# Patient Record
Sex: Female | Born: 1970 | Race: Black or African American | Hispanic: No | Marital: Married | Smoking: Never smoker
Health system: Southern US, Community
[De-identification: ages and names within clinical notes are randomized; demographics above are authoritative.]

## PROBLEM LIST (undated history)

## (undated) DIAGNOSIS — L732 Hidradenitis suppurativa: Secondary | ICD-10-CM

## (undated) DIAGNOSIS — T7840XA Allergy, unspecified, initial encounter: Secondary | ICD-10-CM

## (undated) DIAGNOSIS — L910 Hypertrophic scar: Secondary | ICD-10-CM

## (undated) DIAGNOSIS — M25559 Pain in unspecified hip: Secondary | ICD-10-CM

## (undated) DIAGNOSIS — B009 Herpesviral infection, unspecified: Secondary | ICD-10-CM

## (undated) DIAGNOSIS — I10 Essential (primary) hypertension: Secondary | ICD-10-CM

## (undated) DIAGNOSIS — R5383 Other fatigue: Secondary | ICD-10-CM

## (undated) DIAGNOSIS — Z91018 Allergy to other foods: Secondary | ICD-10-CM

## (undated) DIAGNOSIS — K219 Gastro-esophageal reflux disease without esophagitis: Secondary | ICD-10-CM

## (undated) DIAGNOSIS — M7989 Other specified soft tissue disorders: Secondary | ICD-10-CM

## (undated) DIAGNOSIS — R7303 Prediabetes: Secondary | ICD-10-CM

## (undated) DIAGNOSIS — E7800 Pure hypercholesterolemia, unspecified: Secondary | ICD-10-CM

## (undated) DIAGNOSIS — N920 Excessive and frequent menstruation with regular cycle: Secondary | ICD-10-CM

## (undated) DIAGNOSIS — E739 Lactose intolerance, unspecified: Secondary | ICD-10-CM

## (undated) DIAGNOSIS — L739 Follicular disorder, unspecified: Secondary | ICD-10-CM

## (undated) HISTORY — DX: Allergy, unspecified, initial encounter: T78.40XA

## (undated) HISTORY — DX: Herpesviral infection, unspecified: B00.9

## (undated) HISTORY — DX: Other specified soft tissue disorders: M79.89

## (undated) HISTORY — DX: Pain in unspecified hip: M25.559

## (undated) HISTORY — DX: Lactose intolerance, unspecified: E73.9

## (undated) HISTORY — DX: Essential (primary) hypertension: I10

## (undated) HISTORY — DX: Hidradenitis suppurativa: L73.2

## (undated) HISTORY — DX: Excessive and frequent menstruation with regular cycle: N92.0

## (undated) HISTORY — DX: Gastro-esophageal reflux disease without esophagitis: K21.9

## (undated) HISTORY — DX: Allergy to other foods: Z91.018

## (undated) HISTORY — DX: Follicular disorder, unspecified: L73.9

## (undated) HISTORY — DX: Pure hypercholesterolemia, unspecified: E78.00

## (undated) HISTORY — DX: Hypertrophic scar: L91.0

## (undated) HISTORY — PX: FOOT SURGERY: SHX648

## (undated) HISTORY — DX: Other fatigue: R53.83

## (undated) HISTORY — DX: Prediabetes: R73.03

---

## 1999-04-09 ENCOUNTER — Other Ambulatory Visit: Admission: RE | Admit: 1999-04-09 | Discharge: 1999-04-09 | Payer: Self-pay | Admitting: Family Medicine

## 2001-09-13 ENCOUNTER — Other Ambulatory Visit: Admission: RE | Admit: 2001-09-13 | Discharge: 2001-09-13 | Payer: Self-pay | Admitting: Family Medicine

## 2004-07-24 ENCOUNTER — Ambulatory Visit: Payer: Self-pay | Admitting: Family Medicine

## 2004-07-24 ENCOUNTER — Other Ambulatory Visit: Admission: RE | Admit: 2004-07-24 | Discharge: 2004-07-24 | Payer: Self-pay | Admitting: Family Medicine

## 2004-07-24 LAB — CONVERTED CEMR LAB: Pap Smear: NORMAL

## 2004-09-04 ENCOUNTER — Ambulatory Visit: Payer: Self-pay | Admitting: Family Medicine

## 2006-10-03 ENCOUNTER — Encounter: Payer: Self-pay | Admitting: Family Medicine

## 2006-11-29 ENCOUNTER — Encounter: Payer: Self-pay | Admitting: Family Medicine

## 2006-11-29 DIAGNOSIS — L738 Other specified follicular disorders: Secondary | ICD-10-CM | POA: Insufficient documentation

## 2006-11-29 DIAGNOSIS — L678 Other hair color and hair shaft abnormalities: Secondary | ICD-10-CM | POA: Insufficient documentation

## 2006-11-29 DIAGNOSIS — L910 Hypertrophic scar: Secondary | ICD-10-CM | POA: Insufficient documentation

## 2006-11-30 ENCOUNTER — Ambulatory Visit: Payer: Self-pay | Admitting: Family Medicine

## 2006-11-30 DIAGNOSIS — R5383 Other fatigue: Secondary | ICD-10-CM

## 2006-11-30 DIAGNOSIS — R7989 Other specified abnormal findings of blood chemistry: Secondary | ICD-10-CM | POA: Insufficient documentation

## 2006-11-30 DIAGNOSIS — R5381 Other malaise: Secondary | ICD-10-CM | POA: Insufficient documentation

## 2006-12-01 LAB — CONVERTED CEMR LAB
Basophils Absolute: 0.1 10*3/uL (ref 0.0–0.1)
Eosinophils Absolute: 0.1 10*3/uL (ref 0.0–0.6)
INR: 0.9 (ref 0.9–2.0)
MCHC: 35.6 g/dL (ref 30.0–36.0)
MCV: 86.9 fL (ref 78.0–100.0)
Monocytes Absolute: 0.8 10*3/uL — ABNORMAL HIGH (ref 0.2–0.7)
Monocytes Relative: 11.7 % — ABNORMAL HIGH (ref 3.0–11.0)
RBC: 4.56 M/uL (ref 3.87–5.11)
RDW: 12.6 % (ref 11.5–14.6)
aPTT: 33.3 s (ref 26.5–36.5)

## 2006-12-05 ENCOUNTER — Encounter: Payer: Self-pay | Admitting: Family Medicine

## 2006-12-05 ENCOUNTER — Other Ambulatory Visit: Admission: RE | Admit: 2006-12-05 | Discharge: 2006-12-05 | Payer: Self-pay | Admitting: Family Medicine

## 2006-12-05 ENCOUNTER — Ambulatory Visit: Payer: Self-pay | Admitting: Family Medicine

## 2006-12-09 ENCOUNTER — Encounter (INDEPENDENT_AMBULATORY_CARE_PROVIDER_SITE_OTHER): Payer: Self-pay | Admitting: *Deleted

## 2006-12-09 LAB — CONVERTED CEMR LAB: Pap Smear: NORMAL

## 2007-08-31 ENCOUNTER — Ambulatory Visit: Payer: Self-pay | Admitting: Family Medicine

## 2007-09-26 ENCOUNTER — Ambulatory Visit: Payer: Self-pay | Admitting: Family Medicine

## 2007-10-02 LAB — CONVERTED CEMR LAB: Herpes Simplex Vrs I&II-IgM Ab (EIA): NOT DETECTED

## 2007-12-12 ENCOUNTER — Other Ambulatory Visit: Admission: RE | Admit: 2007-12-12 | Discharge: 2007-12-12 | Payer: Self-pay | Admitting: Family Medicine

## 2007-12-12 ENCOUNTER — Encounter: Payer: Self-pay | Admitting: Family Medicine

## 2007-12-12 ENCOUNTER — Ambulatory Visit: Payer: Self-pay | Admitting: Family Medicine

## 2007-12-14 LAB — CONVERTED CEMR LAB
ALT: 15 units/L (ref 0–35)
AST: 20 units/L (ref 0–37)
Albumin: 3.8 g/dL (ref 3.5–5.2)
Alkaline Phosphatase: 74 units/L (ref 39–117)
BUN: 6 mg/dL (ref 6–23)
Basophils Relative: 0.4 % (ref 0.0–1.0)
CO2: 27 meq/L (ref 19–32)
Chloride: 104 meq/L (ref 96–112)
Creatinine, Ser: 0.8 mg/dL (ref 0.4–1.2)
Eosinophils Absolute: 0.1 10*3/uL (ref 0.0–0.7)
Eosinophils Relative: 0.8 % (ref 0.0–5.0)
Glucose, Bld: 79 mg/dL (ref 70–99)
HDL: 36.7 mg/dL — ABNORMAL LOW (ref >39.0)
MCV: 87 fL (ref 78.0–100.0)
Monocytes Relative: 2.3 % — ABNORMAL LOW (ref 3.0–12.0)
Neutrophils Relative %: 74.6 % (ref 43.0–77.0)
Platelets: 407 10*3/uL — ABNORMAL HIGH (ref 150–400)
Potassium: 3.6 meq/L (ref 3.5–5.1)
RBC: 4.39 M/uL (ref 3.87–5.11)
TSH: 1.15 microintl units/mL (ref 0.35–5.50)
Total CHOL/HDL Ratio: 4.1
Total Protein: 7.5 g/dL (ref 6.0–8.3)
VLDL: 12 mg/dL (ref 0–40)
WBC: 6.3 10*3/uL (ref 4.5–10.5)

## 2007-12-18 ENCOUNTER — Encounter (INDEPENDENT_AMBULATORY_CARE_PROVIDER_SITE_OTHER): Payer: Self-pay | Admitting: *Deleted

## 2008-01-01 ENCOUNTER — Telehealth (INDEPENDENT_AMBULATORY_CARE_PROVIDER_SITE_OTHER): Payer: Self-pay | Admitting: *Deleted

## 2008-06-17 ENCOUNTER — Telehealth: Payer: Self-pay | Admitting: Family Medicine

## 2009-01-20 ENCOUNTER — Ambulatory Visit: Payer: Self-pay | Admitting: Family Medicine

## 2009-01-20 DIAGNOSIS — B009 Herpesviral infection, unspecified: Secondary | ICD-10-CM | POA: Insufficient documentation

## 2009-02-03 ENCOUNTER — Encounter: Payer: Self-pay | Admitting: Family Medicine

## 2009-02-21 ENCOUNTER — Encounter: Payer: Self-pay | Admitting: Family Medicine

## 2009-02-21 LAB — HM MAMMOGRAPHY: HM Mammogram: NORMAL

## 2009-02-22 DIAGNOSIS — R928 Other abnormal and inconclusive findings on diagnostic imaging of breast: Secondary | ICD-10-CM | POA: Insufficient documentation

## 2009-08-08 ENCOUNTER — Telehealth: Payer: Self-pay | Admitting: Family Medicine

## 2010-01-16 ENCOUNTER — Telehealth (INDEPENDENT_AMBULATORY_CARE_PROVIDER_SITE_OTHER): Payer: Self-pay | Admitting: *Deleted

## 2010-01-16 ENCOUNTER — Ambulatory Visit: Payer: Self-pay | Admitting: Family Medicine

## 2010-01-18 LAB — CONVERTED CEMR LAB
ALT: 10 units/L (ref 0–35)
Albumin: 3.6 g/dL (ref 3.5–5.2)
Alkaline Phosphatase: 72 units/L (ref 39–117)
Basophils Relative: 0.4 % (ref 0.0–3.0)
Bilirubin, Direct: 0.1 mg/dL (ref 0.0–0.3)
CO2: 29 meq/L (ref 19–32)
Chloride: 106 meq/L (ref 96–112)
Creatinine, Ser: 0.8 mg/dL (ref 0.4–1.2)
Eosinophils Relative: 0.8 % (ref 0.0–5.0)
Hemoglobin: 13.9 g/dL (ref 12.0–15.0)
LDL Cholesterol: 116 mg/dL — ABNORMAL HIGH (ref 0–99)
MCV: 89.6 fL (ref 78.0–100.0)
Monocytes Absolute: 0.5 10*3/uL (ref 0.1–1.0)
Neutro Abs: 4.7 10*3/uL (ref 1.4–7.7)
Neutrophils Relative %: 69.5 % (ref 43.0–77.0)
Potassium: 4.1 meq/L (ref 3.5–5.1)
RBC: 4.58 M/uL (ref 3.87–5.11)
Sodium: 137 meq/L (ref 135–145)
Total CHOL/HDL Ratio: 4
Total Protein: 6.9 g/dL (ref 6.0–8.3)
Triglycerides: 58 mg/dL (ref 0.0–149.0)
WBC: 6.8 10*3/uL (ref 4.5–10.5)

## 2010-01-23 ENCOUNTER — Other Ambulatory Visit: Admission: RE | Admit: 2010-01-23 | Discharge: 2010-01-23 | Payer: Self-pay | Admitting: Family Medicine

## 2010-01-23 ENCOUNTER — Ambulatory Visit: Payer: Self-pay | Admitting: Family Medicine

## 2010-01-30 ENCOUNTER — Encounter (INDEPENDENT_AMBULATORY_CARE_PROVIDER_SITE_OTHER): Payer: Self-pay | Admitting: *Deleted

## 2010-02-04 ENCOUNTER — Encounter: Payer: Self-pay | Admitting: Family Medicine

## 2010-02-11 ENCOUNTER — Encounter: Payer: Self-pay | Admitting: Family Medicine

## 2010-05-12 ENCOUNTER — Ambulatory Visit: Payer: Self-pay | Admitting: Family Medicine

## 2010-05-12 DIAGNOSIS — L732 Hidradenitis suppurativa: Secondary | ICD-10-CM | POA: Insufficient documentation

## 2010-08-06 NOTE — Assessment & Plan Note (Signed)
Summary: CPX/DLO   Vital Signs:  Patient profile:   40 year old female Height:      67 inches Weight:      227.75 pounds BMI:     35.80 Temp:     98.5 degrees F oral Pulse rate:   84 / minute Pulse rhythm:   regular BP sitting:   118 / 86  (left arm) Cuff size:   large  Vitals Entered By: Lewanda Rife LPN (January 23, 2010 2:30 PM) CC: CPX with pap and breast exam LMP 12/26/09   History of Present Illness: here for health mt exam and gyn care  has been feeling ok   had a summer cold for a while with a cough- is better now   wt is down 5 lb has been working on it  started [employer]   bp good 118/86  menses - is ok with that -- pretty mild now , better  is not on birth control and doesn't need it  did skip for 2 months - then back again was stressed  that may have been from wt loss    has gm with ov cancer  lipids good with trig 58 and HDL 40 and LDL 116  pap 09 no abn paps in the past   mam 8/10- needs to set that up  self exam - no lumps   Td 08  Allergies (verified): No Known Drug Allergies  Past History:  Past Medical History: Last updated: 12/12/2007 menorrhagia keloid scar  folliulitis HSV   Family History: Last updated: 12/05/2006 Father: Pacemaker, HTN, DM (deceased) Mother:HTN, DM Siblings: 2 brothers MGM ovarian ca Guncle with lymphoma  Social History: Last updated: 12/12/2007 Marital Status: Children:  Occupation: works at [employer]  non smoker occ alcohol   Risk Factors: Smoking Status: never (11/29/2006)  Review of Systems General:  Denies fatigue, loss of appetite, and malaise. Eyes:  Denies blurring and eye irritation. CV:  Denies chest pain or discomfort, palpitations, shortness of breath with exertion, and swelling of feet. Resp:  Denies cough, shortness of breath, and wheezing. GI:  Denies abdominal pain, bloody stools, change in bowel habits, indigestion, nausea, and vomiting. GU:  Denies abnormal vaginal  bleeding, discharge, dysuria, and hematuria. MS:  Denies joint pain, cramps, and muscle weakness. Derm:  Denies lesion(s), poor wound healing, and rash. Neuro:  Denies numbness and tingling. Psych:  Denies anxiety and depression. Endo:  Denies excessive thirst and excessive urination. Heme:  Denies abnormal bruising and bleeding.  Physical Exam  General:  overweight but generally well appearing  Head:  normocephalic, atraumatic, and no abnormalities observed.   Eyes:  vision grossly intact, pupils equal, pupils round, and pupils reactive to light.  no conjunctival pallor, injection or icterus  Ears:  R ear normal and L ear normal.   Nose:  nares boggy - some clear rhinorrhea  Mouth:  pharynx pink and moist.    Neck:  supple with full rom and no masses or thyromegally, no JVD or carotid bruit  Chest Wall:  No deformities, masses, or tenderness noted. Breasts:  No mass, nodules, thickening, tenderness, bulging, retraction, inflamation, nipple discharge or skin changes noted.   Lungs:  Normal respiratory effort, chest expands symmetrically. Lungs are clear to auscultation, no crackles or wheezes. Heart:  Normal rate and regular rhythm. S1 and S2 normal without gallop, murmur, click, rub or other extra sounds. Abdomen:  Bowel sounds positive,abdomen soft and non-tender without masses, organomegaly or hernias noted. no renal  bruits  Genitalia:  scant blood at os- starting menses normal introitus, no external lesions, no vaginal discharge, mucosa pink and moist, no vaginal or cervical lesions, no vaginal atrophy, normal uterus size and position, and no adnexal masses or tenderness.   Msk:  No deformity or scoliosis noted of thoracic or lumbar spine.  no acute joint changes  nl rom both feet- no swelling or tenderness  Pulses:  R and L carotid,radial,femoral,dorsalis pedis and posterior tibial pulses are full and equal bilaterally Extremities:  No clubbing, cyanosis, edema, or deformity noted  with normal full range of motion of all joints.   Neurologic:  sensation intact to light touch, gait normal, and DTRs symmetrical and normal.   Skin:  Intact without suspicious lesions or rashes some lentigos  Cervical Nodes:  No lymphadenopathy noted Inguinal Nodes:  No significant adenopathy Psych:  normal affect, talkative and pleasant    Impression & Recommendations:  Problem # 1:  HEALTH MAINTENANCE EXAM (ICD-V70.0) Assessment Comment Only reviewed health habits including diet, exercise and skin cancer prevention reviewed health maintenance list and family history enc to keep up the wt loss with wt watchers and exercise rev wellness labs and lipids with pt   Problem # 2:  ROUTINE GYNECOLOGICAL EXAMINATION (ICD-V72.31) Assessment: Comment Only annual exam  was starting menses with exam  no problems  Problem # 3:  OTHER SCREENING MAMMOGRAM (ICD-V76.12) Assessment: Comment Only annual mammogram scheduled adv pt to continue regular self breast exams non remarkable breast exam today  Orders: Radiology Referral (Radiology)  Complete Medication List: 1)  Voltaren 1 % Gel (Diclofenac sodium) .... Apply small amount to affected knee up to three times a day as needed 2)  Naproxen 500 Mg Tabs (Naproxen) .... By mouth as needed  Patient Instructions: 1)  keep working on the healthy diet and exercise  2)  we will schedule mammogram at check out  3)  labs are ok including cholesterol   Current Allergies (reviewed today): No known allergies

## 2010-08-06 NOTE — Progress Notes (Signed)
Summary: refill request for valtrex  Phone Note Refill Request Message from:  Fax from Pharmacy  Refills Requested: Medication #1:  VALTREX 500 MG  TABS 1 by mouth two times a day for 7 days   Last Refilled: 11/10/2008 [facility] [address]  Initial call taken by: Lowella Petties CMA,  August 08, 2009 9:58 AM  Follow-up for Phone Call        px written on EMR for call in  Follow-up by: Judith Part MD,  August 08, 2009 11:00 AM    New/Updated Medications: VALTREX 500 MG  TABS (VALACYCLOVIR HCL) 1 by mouth two times a day for 7 days Prescriptions: VALTREX 500 MG  TABS (VALACYCLOVIR HCL) 1 by mouth two times a day for 7 days  #14 x 5   Entered by:   Lowella Petties CMA   Authorized by:   Judith Part MD   Signed by:   Lowella Petties CMA on 08/08/2009   Method used:   [facility]        [facility]       [address]       [place], [place]  [address]       Ph: [phone] or [phone]       Fax: [phone]   RxID:   [id number] VALTREX 500 MG  TABS (VALACYCLOVIR HCL) 1 by mouth two times a day for 7 days  #14 x 5   Entered and Authorized by:   Judith Part MD   Signed by:   Judith Part MD on 08/08/2009   Method used:   Telephoned to ...         RxID:   [id number]

## 2010-08-06 NOTE — Assessment & Plan Note (Signed)
Summary: bumps under skin/alc   Vital Signs:  Patient profile:   40 year old female Height:      67 inches Weight:      242.25 pounds BMI:     38.08 Temp:     98.2 degrees F oral Pulse rate:   80 / minute Pulse rhythm:   regular BP sitting:   116 / 78  (left arm) Cuff size:   large  Vitals Entered By: Lewanda Rife LPN (May 12, 2010 3:03 PM) CC: bump under skin on rt breast   History of Present Illness: has bumps under skin -- cannot break them open  usually gets them on legs  like cyst or abcess  now one in between breasts   no fever feeling ok   did have some b9 app calcifications on last mam - in aug/ rec 6 mo f/u for this   Allergies (verified): No Known Drug Allergies  Past History:  Family History: Last updated: 12/05/2006 Father: Pacemaker, HTN, DM (deceased) Mother:HTN, DM Siblings: 2 brothers MGM ovarian ca Guncle with lymphoma  Social History: Last updated: 12/12/2007 Marital Status: Children:  Occupation: works at [employer]  non smoker occ alcohol   Risk Factors: Smoking Status: never (11/29/2006)  Past Medical History: menorrhagia keloid scar  folliulitis HSV  hydradenitis   Review of Systems General:  Denies chills, fatigue, fever, loss of appetite, and malaise. Eyes:  Denies blurring and eye irritation. CV:  Denies chest pain or discomfort and palpitations. Resp:  Denies cough. MS:  Denies joint pain, joint redness, and joint swelling. Derm:  Complains of itching and lesion(s); denies insect bite(s). Neuro:  Denies numbness and tingling. Endo:  Denies cold intolerance and heat intolerance. Heme:  Denies abnormal bruising and bleeding.  Physical Exam  General:  overweight but generally well appearing  Head:  normocephalic, atraumatic, and no abnormalities observed.   Eyes:  vision grossly intact, pupils equal, pupils round, and pupils reactive to light.   Mouth:  pharynx pink and moist.   Neck:  supple with full rom and no  masses or thyromegally, no JVD or carotid bruit  Breasts:  No mass, nodules, thickening, tenderness, bulging, retraction, inflamation, nipple discharge or skin changes noted.   Lungs:  Normal respiratory effort, chest expands symmetrically. Lungs are clear to auscultation, no crackles or wheezes. Heart:  Normal rate and regular rhythm. S1 and S2 normal without gallop, murmur, click, rub or other extra sounds. Skin:  area of superficial induration and redness overlying scar under R breast medially  no drainage  is soft but not fluctuant  slt tender  Cervical Nodes:  No lymphadenopathy noted Psych:  normal affect, talkative and pleasant    Impression & Recommendations:  Problem # 1:  HIDRADENITIS SUPPURATIVA (ICD-705.83) Assessment New  with troublesome area under R medial breast  is soft / non draining  will tx with keflex and warm compresses  update if not improved  do not expect it to rupture  disc washing with antibacterial soap and avoiding friction in areas when able (she gets problems in groin and under arms also)  Orders: Prescription Created Electronically [phone])  Complete Medication List: 1)  Voltaren 1 % Gel (Diclofenac sodium) .... Apply small amount to affected knee up to three times a day as needed 2)  Naproxen 500 Mg Tabs (Naproxen) .... By mouth as needed 3)  Ibuprofen 200 Mg Tabs (Ibuprofen) .... Otc as directed. 4)  Keflex 500 Mg Caps (Cephalexin) .Marland Kitchen.. 1 by mouth  two times a day for 7 days  Patient Instructions: 1)  keep area clean with antibacterial soap and water  2)  use warm compress every chance you get  3)  take the keflex as directed  4)  if not improved in a week or if worse at any time - please call  Prescriptions: KEFLEX 500 MG CAPS (CEPHALEXIN) 1 by mouth two times a day for 7 days  #14 x 0   Entered and Authorized by:   Judith Part MD   Signed by:   Judith Part MD on 05/12/2010   Method used:   [facility]        [facility]       [address]       [place], [place]  [address]       Ph: [phone] or [phone]       Fax: [phone]   RxID:   [id number]    Orders Added: 1)  Prescription Created Electronically [G8553] 2)  Est. Patient Level III [44034]    Current Allergies (reviewed today): No known allergies

## 2010-08-06 NOTE — Progress Notes (Signed)
----   Converted from flag ---- ---- 01/16/2010 10:15 AM, Judith Part MD wrote: please check wellness and lipids v70.0- thanks   ---- 01/15/2010 9:12 AM, Liane Comber CMA (AAMA) wrote: Pt is scheduled for cpx labs tomorrow, what labs to draw and dx codes? Thanks Tasha ------------------------------

## 2010-08-06 NOTE — Letter (Signed)
Summary: Results Follow up Letter  [place] [facility]  [address] [place], [place] [address]   Phone: [phone]  Fax: [phone]    01/30/2010 MRN: [id number]  [facility] [address] [place], [place]  [address]  Dear Ashley Mercado,  The following are the results of your recent test(s):  Test         Result    Pap Smear:        Normal __x___  Not Normal _____ Comments: __Next pap due in one year ____________________________________________________ Cholesterol: LDL(Bad cholesterol):         Your goal is less than:         HDL (Good cholesterol):       Your goal is more than: Comments:  ______________________________________________________ Mammogram:        Normal _____  Not Normal _____ Comments:  ___________________________________________________________________ Hemoccult:        Normal _____  Not normal _______ Comments:    _____________________________________________________________________ Other Tests:    We routinely do not discuss normal results over the telephone.  If you desire a copy of the results, or you have any questions about this information we can discuss them at your next office visit.   Sincerely,

## 2010-08-17 ENCOUNTER — Encounter: Payer: Self-pay | Admitting: Family Medicine

## 2010-11-04 ENCOUNTER — Other Ambulatory Visit: Payer: Self-pay | Admitting: Family Medicine

## 2010-11-04 DIAGNOSIS — F329 Major depressive disorder, single episode, unspecified: Secondary | ICD-10-CM | POA: Insufficient documentation

## 2010-11-04 DIAGNOSIS — F32A Depression, unspecified: Secondary | ICD-10-CM | POA: Insufficient documentation

## 2010-11-04 MED ORDER — SERTRALINE HCL 50 MG PO TABS: 150.0000 mg | ORAL_TABLET | Freq: Every day | ORAL | Status: DC

## 2011-02-06 ENCOUNTER — Telehealth: Payer: Self-pay | Admitting: Family Medicine

## 2011-02-06 DIAGNOSIS — Z0000 Encounter for general adult medical examination without abnormal findings: Secondary | ICD-10-CM

## 2011-02-06 NOTE — Telephone Encounter (Signed)
Message copied by Judy Pimple on Sat Feb 06, 2011 10:07 PM ------      Message from: Baldomero Lamy      Created: Fri Feb 05, 2011  9:26 AM      Regarding: cpx labs thurs 8/9       Please order  future cpx labs for pt's upcomming lab appt.      Thanks      Rodney Booze

## 2011-02-10 ENCOUNTER — Encounter: Payer: Self-pay | Admitting: Family Medicine

## 2011-02-10 LAB — HM PAP SMEAR: HM Pap smear: 7222011

## 2011-02-11 ENCOUNTER — Other Ambulatory Visit (INDEPENDENT_AMBULATORY_CARE_PROVIDER_SITE_OTHER): Payer: Commercial Indemnity

## 2011-02-11 DIAGNOSIS — Z0000 Encounter for general adult medical examination without abnormal findings: Secondary | ICD-10-CM

## 2011-02-11 LAB — LIPID PANEL
LDL Cholesterol: 95 mg/dL (ref 0–99)
Total CHOL/HDL Ratio: 3
Triglycerides: 68 mg/dL (ref 0.0–149.0)
VLDL: 13.6 mg/dL (ref 0.0–40.0)

## 2011-02-11 LAB — COMPREHENSIVE METABOLIC PANEL
BUN: 12 mg/dL (ref 6–23)
CO2: 29 mEq/L (ref 19–32)
Calcium: 8.5 mg/dL (ref 8.4–10.5)
Chloride: 103 mEq/L (ref 96–112)
Creatinine, Ser: 0.8 mg/dL (ref 0.4–1.2)
GFR: 102.5 mL/min (ref >60.00)
Glucose, Bld: 95 mg/dL (ref 70–99)

## 2011-02-11 LAB — CBC WITH DIFFERENTIAL/PLATELET
Basophils Relative: 0.9 % (ref 0.0–3.0)
HCT: 42.3 % (ref 36.0–46.0)
Hemoglobin: 14.2 g/dL (ref 12.0–15.0)
Lymphocytes Relative: 33.2 % (ref 12.0–46.0)
Monocytes Relative: 8.6 % (ref 3.0–12.0)
Neutro Abs: 2.7 10*3/uL (ref 1.4–7.7)
RBC: 4.69 Mil/uL (ref 3.87–5.11)

## 2011-02-11 LAB — TSH: TSH: 1.11 u[IU]/mL (ref 0.35–5.50)

## 2011-02-16 ENCOUNTER — Encounter: Payer: Self-pay | Admitting: Family Medicine

## 2011-02-17 ENCOUNTER — Ambulatory Visit (INDEPENDENT_AMBULATORY_CARE_PROVIDER_SITE_OTHER): Payer: Commercial Indemnity | Admitting: Family Medicine

## 2011-02-17 ENCOUNTER — Other Ambulatory Visit (HOSPITAL_COMMUNITY)
Admission: RE | Admit: 2011-02-17 | Discharge: 2011-02-17 | Disposition: A | Discharge status: Home / Self Care | Payer: Commercial Indemnity | Source: Ambulatory Visit | Attending: Family Medicine | Admitting: Family Medicine

## 2011-02-17 ENCOUNTER — Encounter: Payer: Self-pay | Admitting: Family Medicine

## 2011-02-17 DIAGNOSIS — F32A Depression, unspecified: Secondary | ICD-10-CM

## 2011-02-17 DIAGNOSIS — F329 Major depressive disorder, single episode, unspecified: Secondary | ICD-10-CM

## 2011-02-17 DIAGNOSIS — Z0000 Encounter for general adult medical examination without abnormal findings: Secondary | ICD-10-CM

## 2011-02-17 DIAGNOSIS — Z01419 Encounter for gynecological examination (general) (routine) without abnormal findings: Secondary | ICD-10-CM | POA: Insufficient documentation

## 2011-02-17 DIAGNOSIS — B009 Herpesviral infection, unspecified: Secondary | ICD-10-CM

## 2011-02-17 MED ORDER — VALACYCLOVIR HCL 500 MG PO TABS: 500.0000 mg | ORAL_TABLET | Freq: Every day | ORAL | Status: DC

## 2011-02-17 NOTE — Assessment & Plan Note (Signed)
Reviewed health habits including diet and exercise and skin cancer prevention Also reviewed health mt list, fam hx and immunizations   Reviewed normal wellness labs today and good cholesterol Urged to keep up good health habits

## 2011-02-17 NOTE — Patient Instructions (Signed)
Try the valtrex 500 mg one pill every day to prevent hsv outbreaks Update me if side effects or problems  Labs are good  Keep up the weight loss effort

## 2011-02-17 NOTE — Progress Notes (Signed)
Subjective:    Patient ID: Ashley Mercado, female    DOB: [birth year], 40 y.o.   MRN: [id number]  HPI Here for annual health mt exam Feels good  No new medical problems   Wt is down 15lb ! Is excited about that Has changed her eating -- eats a lot more vegetables (steaming them)  No more fried food  Chol good too  Is walking regularly for exercise - early ams   Td 08  Pap nl 7/11 Needs to do that  Does not see gyn  peroids are normal - not too heavy or painful / and are regularly  Does not need birth control   Mam was Monday --waiting for result -thinks it was normal  No longer gets it every 6 months  Gets skin cysts  Self exam   Has hx of hsv- has outbreaks occasionally  Thinks she has about 4 outbreaks per year and lasts longer  Is interested in proph tx   Depression- overall fine  More tired feeling  Long work hours - gets up at 3:30 am  Goes to bed 9   Nl wellness labs  Good chol with LDL 95 Lab Results  Component Value Date   CHOL 156 02/11/2011   CHOL 168 01/16/2010   CHOL 151 12/12/2007   Lab Results  Component Value Date   HDL 47.80 02/11/2011   HDL 40.00 01/16/2010   HDL 24.4* 12/12/2007   Lab Results  Component Value Date   LDLCALC 95 02/11/2011   LDLCALC 116* 01/16/2010   LDLCALC 103* 12/12/2007   Lab Results  Component Value Date   TRIG 68.0 02/11/2011   TRIG 58.0 01/16/2010   TRIG 58 12/12/2007   Lab Results  Component Value Date   CHOLHDL 3 02/11/2011   CHOLHDL 4 01/16/2010   CHOLHDL 4.1 CALC 12/12/2007   No results found for this basename: LDLDIRECT    Patient Active Problem List  Diagnoses  . HSV  . THROMBOCYTOSIS  . KELOID SCAR  . FOLLICULITIS  . HIDRADENITIS SUPPURATIVA  . FATIGUE  . Depression  . Routine general medical examination at a health care facility  . Gynecologic exam normal   Past Medical History  Diagnosis Date  . Menorrhagia   . Keloid scar   . Folliculitis   . HSV infection   . Hydradenitis    No past surgical history  on file. History  Substance Use Topics  . Smoking status: Never Smoker   . Smokeless tobacco: Not on file  . Alcohol Use: Yes   Family History  Problem Relation Age of Onset  . Hypertension Mother   . Diabetes Mother   . Hypertension Father   . Diabetes Father   . Cancer Maternal Uncle     lymphoma  . Cancer Maternal Grandmother     ovarian   No Known Allergies Current Outpatient Prescriptions on File Prior to Visit  Medication Sig Dispense Refill  . diclofenac sodium (VOLTAREN) 1 % GEL Apply topically. Apply small amount to affected knee up to three times a day as needed.       Marland Kitchen ibuprofen (ADVIL,MOTRIN) 200 MG tablet Take 200 mg by mouth as directed.            Review of Systems Review of Systems  Constitutional: Negative for fever, appetite change, fatigue and unexpected weight change.  Eyes: Negative for pain and visual disturbance.  Respiratory: Negative for cough and shortness of breath.   Cardiovascular: Negative. For cp or  palpitation  Gastrointestinal: Negative for nausea, diarrhea and constipation.  Genitourinary: Negative for urgency and frequency.  Skin: Negative for pallor. pos for occ rash from hsv breakout  Neurological: Negative for weakness, light-headedness, numbness and headaches.  Hematological: Negative for adenopathy. Does not bruise/bleed easily.  Psychiatric/Behavioral: Negative for dysphoric mood. The patient is not nervous/anxious.         Objective:   Physical Exam  Constitutional: She appears well-developed and well-nourished.  HENT:  Head: Normocephalic and atraumatic.  Right Ear: External ear normal.  Left Ear: External ear normal.  Nose: Nose normal.  Mouth/Throat: Oropharynx is clear and moist.  Eyes: Conjunctivae and EOM are normal. Pupils are equal, round, and reactive to light.  Neck: Normal range of motion. Neck supple. No JVD present. Carotid bruit is not present. No thyromegaly present.  Cardiovascular: Normal rate, regular  rhythm, normal heart sounds and intact distal pulses.   Pulmonary/Chest: Breath sounds normal. No respiratory distress. She has no wheezes.  Abdominal: Soft. Bowel sounds are normal. She exhibits no distension and no mass. There is no tenderness.  Genitourinary: Vagina normal and uterus normal. No breast swelling, tenderness, discharge or bleeding. No vaginal discharge found.  Musculoskeletal: Normal range of motion. She exhibits no edema and no tenderness.  Lymphadenopathy:    She has no cervical adenopathy.  Neurological: She is alert. She has normal reflexes. No cranial nerve deficit. Coordination normal.  Skin: Skin is warm and dry. No rash noted. No erythema. No pallor.  Psychiatric: She has a normal mood and affect.          Assessment & Plan:

## 2011-02-17 NOTE — Assessment & Plan Note (Signed)
Annual exam with pap  No complaints Nl breast exam - enc self exams Pend mam report from [facility]

## 2011-02-17 NOTE — Assessment & Plan Note (Signed)
In light of frequent /longer outbreaks will try suppressive therapy Valtrex 500 daily Update if problems

## 2011-02-22 ENCOUNTER — Encounter: Payer: Self-pay | Admitting: *Deleted

## 2011-10-20 ENCOUNTER — Ambulatory Visit (INDEPENDENT_AMBULATORY_CARE_PROVIDER_SITE_OTHER): Payer: Commercial Indemnity | Admitting: Family Medicine

## 2011-10-20 ENCOUNTER — Encounter: Payer: Self-pay | Admitting: Family Medicine

## 2011-10-20 VITALS — BP 132/72 | HR 87 | Temp 97.9°F | Ht 67.0 in | Wt 244.0 lb

## 2011-10-20 DIAGNOSIS — R030 Elevated blood-pressure reading, without diagnosis of hypertension: Secondary | ICD-10-CM

## 2011-10-20 DIAGNOSIS — E669 Obesity, unspecified: Secondary | ICD-10-CM

## 2011-10-20 NOTE — Progress Notes (Signed)
Subjective:    Patient ID: Ashley Mercado, female    DOB: 09/25/1970, 41 y.o.   MRN: [id number]  HPI Here for bp check Was concerned  Systolic sometimes gets into the 130s - other times under 120s  Diastolic is usually in 70s   No HTN symptoms at all or headaches   Wt is up 17 lb with bmi of 38  bp today is 132/72  Walks - maybe twice per week , normally 2 miles -- ? How long it takes her  Needs to increase the frequency  Enrolled in weight loss program at work- will weigh/ measure/ check bp regularly  Focus on choosing right foods  occ eats too much junk -needs to cook more  - ? If portions are too big or not Is not eating regularly  Will be keeping a food journal - and a nutritionist will help her out   Both parents did have HTN  So is on the look out for that  Patient Active Problem List  Diagnoses  . HSV  . THROMBOCYTOSIS  . KELOID SCAR  . FOLLICULITIS  . HIDRADENITIS SUPPURATIVA  . FATIGUE  . Depression  . Routine general medical examination at a health care facility  . Gynecologic exam normal  . Obesity  . Elevated blood-pressure reading without diagnosis of hypertension   Past Medical History  Diagnosis Date  . Menorrhagia   . Keloid scar   . Folliculitis   . HSV infection   . Hydradenitis    No past surgical history on file. History  Substance Use Topics  . Smoking status: Never Smoker   . Smokeless tobacco: Not on file  . Alcohol Use: Yes   Family History  Problem Relation Age of Onset  . Hypertension Mother   . Diabetes Mother   . Hypertension Father   . Diabetes Father   . Cancer Maternal Uncle     lymphoma  . Cancer Maternal Grandmother     ovarian   No Known Allergies Current Outpatient Prescriptions on File Prior to Visit  Medication Sig Dispense Refill  . diclofenac sodium (VOLTAREN) 1 % GEL Apply topically. Apply small amount to affected knee up to three times a day as needed.       Marland Kitchen ibuprofen (ADVIL,MOTRIN) 200 MG tablet Take  200 mg by mouth as directed.        . Multiple Vitamin (MULTIVITAMIN) tablet Take 2 tablets by mouth daily.          Review of Systems Review of Systems  Constitutional: Negative for fever, appetite change, fatigue and unexpected weight change.  Eyes: Negative for pain and visual disturbance.  Respiratory: Negative for cough and shortness of breath.   Cardiovascular: Negative for cp or palpitations    Gastrointestinal: Negative for nausea, diarrhea and constipation.  Genitourinary: Negative for urgency and frequency.  Skin: Negative for pallor or rash   Neurological: Negative for weakness, light-headedness, numbness and headaches.  Hematological: Negative for adenopathy. Does not bruise/bleed easily.  Psychiatric/Behavioral: Negative for dysphoric mood. The patient is not nervous/anxious.          Objective:   Physical Exam  Constitutional: She appears well-developed and well-nourished. No distress.       Obese and well appearing   HENT:  Head: Normocephalic and atraumatic.  Mouth/Throat: Oropharynx is clear and moist.  Eyes: Conjunctivae and EOM are normal. Pupils are equal, round, and reactive to light. No scleral icterus.  Neck: Normal range of motion. Neck supple.  No JVD present. Carotid bruit is not present. No thyromegaly present.  Cardiovascular: Normal rate, regular rhythm, normal heart sounds and intact distal pulses.  Exam reveals no gallop.   No murmur heard. Pulmonary/Chest: Effort normal and breath sounds normal. No respiratory distress. She has no wheezes.  Abdominal: Soft. Bowel sounds are normal. She exhibits no distension, no abdominal bruit and no mass. There is no tenderness.  Musculoskeletal: Normal range of motion. She exhibits no edema and no tenderness.  Lymphadenopathy:    She has no cervical adenopathy.  Neurological: She is alert. She has normal reflexes. No cranial nerve deficit. Coordination normal.  Skin: Skin is warm and dry. No rash noted. No  erythema. No pallor.  Psychiatric: She has a normal mood and affect.          Assessment & Plan:

## 2011-10-20 NOTE — Patient Instructions (Addendum)
Your bp is not in the HTN range at this time- but you are at risk Work on weight loss through program at work  Aim for exercise (indoors or out) 5 days per week - work up to 30 minutes  We will see you in august unless you need [place] earlier

## 2011-10-20 NOTE — Assessment & Plan Note (Signed)
Pt is at high risk with obesity and fam hx  Outlined plan for lifestyle change and wt loss  Will f/u in summer Can monitor bp at work- disc what to watch for  Handout on HTN given also

## 2011-10-20 NOTE — Assessment & Plan Note (Signed)
Discussed how this problem influences overall health and the risks it imposes  Reviewed plan for weight loss with lower calorie diet (via better food choices and also portion control or program like weight watchers) and exercise building up to or more than 30 minutes 5 days per week including some aerobic activity    Will work with program at work to make better food choices Given info on [employer] also

## 2012-02-12 ENCOUNTER — Telehealth: Payer: Self-pay | Admitting: Family Medicine

## 2012-02-12 DIAGNOSIS — Z0000 Encounter for general adult medical examination without abnormal findings: Secondary | ICD-10-CM

## 2012-02-12 NOTE — Telephone Encounter (Signed)
Message copied by Judy Pimple on Sat Feb 12, 2012  9:34 PM ------      Message from: Alvina Chou      Created: Tue Feb 08, 2012  3:38 PM      Regarding: Lab orders for Monday, 8.12.13       Patient is scheduled for CPX labs, please order future labs, Thanks , Camelia Eng

## 2012-02-14 ENCOUNTER — Other Ambulatory Visit (INDEPENDENT_AMBULATORY_CARE_PROVIDER_SITE_OTHER): Payer: Commercial Indemnity

## 2012-02-14 DIAGNOSIS — Z0000 Encounter for general adult medical examination without abnormal findings: Secondary | ICD-10-CM

## 2012-02-14 LAB — COMPREHENSIVE METABOLIC PANEL
ALT: 17 U/L (ref 0–35)
AST: 19 U/L (ref 0–37)
Albumin: 3.8 g/dL (ref 3.5–5.2)
CO2: 24 mEq/L (ref 19–32)
Calcium: 8.7 mg/dL (ref 8.4–10.5)
Chloride: 103 mEq/L (ref 96–112)
GFR: 106.67 mL/min (ref >60.00)
Potassium: 3.6 mEq/L (ref 3.5–5.1)
Sodium: 137 mEq/L (ref 135–145)
Total Protein: 7.9 g/dL (ref 6.0–8.3)

## 2012-02-14 LAB — CBC WITH DIFFERENTIAL/PLATELET
Basophils Absolute: 0 10*3/uL (ref 0.0–0.1)
Eosinophils Absolute: 0.1 10*3/uL (ref 0.0–0.7)
Hemoglobin: 13.8 g/dL (ref 12.0–15.0)
Lymphocytes Relative: 32 % (ref 12.0–46.0)
MCHC: 32.8 g/dL (ref 30.0–36.0)
Monocytes Relative: 7.8 % (ref 3.0–12.0)
Neutro Abs: 2.9 10*3/uL (ref 1.4–7.7)
Neutrophils Relative %: 58.5 % (ref 43.0–77.0)
Platelets: 387 10*3/uL (ref 150.0–400.0)
RDW: 14.7 % — ABNORMAL HIGH (ref 11.5–14.6)

## 2012-02-14 LAB — LIPID PANEL
Cholesterol: 178 mg/dL (ref 0–200)
HDL: 51.5 mg/dL (ref >39.00)

## 2012-02-14 LAB — TSH: TSH: 2.19 u[IU]/mL (ref 0.35–5.50)

## 2012-02-18 ENCOUNTER — Other Ambulatory Visit (HOSPITAL_COMMUNITY)
Admission: RE | Admit: 2012-02-18 | Discharge: 2012-02-18 | Disposition: A | Discharge status: Home / Self Care | Payer: Commercial Indemnity | Source: Ambulatory Visit | Attending: Family Medicine | Admitting: Family Medicine

## 2012-02-18 ENCOUNTER — Encounter: Payer: Self-pay | Admitting: Family Medicine

## 2012-02-18 ENCOUNTER — Other Ambulatory Visit: Payer: Self-pay | Admitting: Family Medicine

## 2012-02-18 ENCOUNTER — Ambulatory Visit (INDEPENDENT_AMBULATORY_CARE_PROVIDER_SITE_OTHER): Payer: Commercial Indemnity | Admitting: Family Medicine

## 2012-02-18 VITALS — BP 132/72 | HR 86 | Temp 97.9°F | Ht 68.0 in | Wt 246.5 lb

## 2012-02-18 DIAGNOSIS — Z1231 Encounter for screening mammogram for malignant neoplasm of breast: Secondary | ICD-10-CM

## 2012-02-18 DIAGNOSIS — Z01419 Encounter for gynecological examination (general) (routine) without abnormal findings: Secondary | ICD-10-CM | POA: Insufficient documentation

## 2012-02-18 DIAGNOSIS — E669 Obesity, unspecified: Secondary | ICD-10-CM

## 2012-02-18 DIAGNOSIS — L732 Hidradenitis suppurativa: Secondary | ICD-10-CM

## 2012-02-18 DIAGNOSIS — Z0000 Encounter for general adult medical examination without abnormal findings: Secondary | ICD-10-CM

## 2012-02-18 DIAGNOSIS — M722 Plantar fascial fibromatosis: Secondary | ICD-10-CM

## 2012-02-18 NOTE — Assessment & Plan Note (Signed)
With heel pain after inactivity in obese pt  Wt loss would help but this interferes with her exercise Given handouts Will try ice / voltaren gel/ supportive shoes/ stretches and update If not imp consider sport med visit/ custom orthotics

## 2012-02-18 NOTE — Progress Notes (Signed)
Subjective:    Patient ID: Ashley Mercado, female    DOB: 1971-07-01, 41 y.o.   MRN: [id number]  HPI Here for health maintenance exam and to review chronic medical problems   Had a busy summer  Is feeling more tired lately-= not a lot of energy to work out   [employer] is up 2 lb with bmi of 37 Obese- hard time loosing weight Diet- is difficult - employee brings her candy  caff- does drink tea - half and half  Exercise= does well for a week at a time , job schedule varies She does feel better   mammo 8/12  Self exam- no lumps or changes  Does have hydraadenitis On cyst on inner thigh today  Pap 8/12-wants to get exam today Menses- LMP June the first , getting more irregular with time / does miss them occas  occ night sweats  Does not think she wants to get pregnant  Flu shot- did get a flu shot in the fall     Chemistry      Component Value Date/Time   NA 137 02/14/2012 0854   K 3.6 02/14/2012 0854   CL 103 02/14/2012 0854   CO2 24 02/14/2012 0854   BUN 14 02/14/2012 0854   CREATININE 0.8 02/14/2012 0854      Component Value Date/Time   CALCIUM 8.7 02/14/2012 0854   ALKPHOS 76 02/14/2012 0854   AST 19 02/14/2012 0854   ALT 17 02/14/2012 0854   BILITOT 0.6 02/14/2012 0854      Lab Results  Component Value Date   CHOL 178 02/14/2012   CHOL 156 02/11/2011   CHOL 168 01/16/2010   Lab Results  Component Value Date   HDL 51.50 02/14/2012   HDL 78.29 02/11/2011   HDL 40.00 01/16/2010   Lab Results  Component Value Date   LDLCALC 113* 02/14/2012   LDLCALC 95 02/11/2011   LDLCALC 116* 01/16/2010   Lab Results  Component Value Date   TRIG 67.0 02/14/2012   TRIG 68.0 02/11/2011   TRIG 58.0 01/16/2010   Lab Results  Component Value Date   CHOLHDL 3 02/14/2012   CHOLHDL 3 02/11/2011   CHOLHDL 4 01/16/2010   No results found for this basename: LDLDIRECT   ate ribs the day before her blood draw Is trying to be better   Patient Active Problem List  Diagnosis  . HSV  . THROMBOCYTOSIS  .  KELOID SCAR  . FOLLICULITIS  . HIDRADENITIS SUPPURATIVA  . FATIGUE  . Depression  . Routine general medical examination at a health care facility  . Routine gynecological examination  . Obesity  . Elevated blood-pressure reading without diagnosis of hypertension  . Other screening mammogram  . Plantar fasciitis of left foot   Past Medical History  Diagnosis Date  . Menorrhagia   . Keloid scar   . Folliculitis   . HSV infection   . Hydradenitis    No past surgical history on file. History  Substance Use Topics  . Smoking status: Never Smoker   . Smokeless tobacco: Not on file  . Alcohol Use: Yes   Family History  Problem Relation Age of Onset  . Hypertension Mother   . Diabetes Mother   . Hypertension Father   . Diabetes Father   . Cancer Maternal Uncle     lymphoma  . Cancer Maternal Grandmother     ovarian   No Known Allergies Current Outpatient Prescriptions on File Prior to Visit  Medication  Sig Dispense Refill  . diclofenac sodium (VOLTAREN) 1 % GEL Apply topically. Apply small amount to affected knee up to three times a day as needed.       Marland Kitchen ibuprofen (ADVIL,MOTRIN) 200 MG tablet Take 200 mg by mouth as directed.        . Multiple Vitamin (MULTIVITAMIN) tablet Take 2 tablets by mouth daily.            Review of Systems Review of Systems  Constitutional: Negative for fever, appetite change, fatigue and unexpected weight change.  Eyes: Negative for pain and visual disturbance.  Respiratory: Negative for cough and shortness of breath.   Cardiovascular: Negative for cp or palpitations    Gastrointestinal: Negative for nausea, diarrhea and constipation.  Genitourinary: Negative for urgency and frequency.  Skin: Negative for pallor or rash  pos for recurrent skin cysts MSK pos for foot/ heel pain without swelling or injury Neurological: Negative for weakness, light-headedness, numbness and headaches.  Hematological: Negative for adenopathy. Does not  bruise/bleed easily.  Psychiatric/Behavioral: Negative for dysphoric mood. The patient is not nervous/anxious.         Objective:   Physical Exam  Constitutional: She appears well-developed and well-nourished. No distress.  HENT:  Head: Normocephalic and atraumatic.  Right Ear: External ear normal.  Left Ear: External ear normal.  Nose: Nose normal.  Mouth/Throat: Oropharynx is clear and moist.  Eyes: Conjunctivae and EOM are normal. Pupils are equal, round, and reactive to light. No scleral icterus.  Neck: Normal range of motion. Neck supple. No JVD present. Carotid bruit is not present. No thyromegaly present.  Cardiovascular: Normal rate, regular rhythm, normal heart sounds and intact distal pulses.  Exam reveals no gallop.   Pulmonary/Chest: Effort normal and breath sounds normal. No respiratory distress. She has no wheezes.  Abdominal: Soft. Bowel sounds are normal. She exhibits no distension, no abdominal bruit and no mass. There is no tenderness.  Genitourinary: Vagina normal. No breast swelling, tenderness, discharge or bleeding. There is no rash or tenderness on the right labia. There is no rash or tenderness on the left labia. Uterus is not enlarged and not tender. Cervix exhibits no motion tenderness, no discharge and no friability. Right adnexum displays no mass, no tenderness and no fullness. Left adnexum displays no mass, no tenderness and no fullness.       Breast exam: No mass, nodules, thickening, tenderness, bulging, retraction, inflamation, nipple discharge or skin changes noted.  No axillary or clavicular LA.  Chaperoned exam.   Scars from hidradenitis on inner thighs  Musculoskeletal: Normal range of motion. She exhibits no edema and no tenderness.       Tender over heel/ calcaneous of L foot-no swelling or warmth No other tenderness  Lymphadenopathy:    She has no cervical adenopathy.  Neurological: She is alert. She has normal reflexes. No cranial nerve deficit. She  exhibits normal muscle tone. Coordination normal.  Skin: Skin is warm and dry. No rash noted. No erythema. No pallor.  Psychiatric: She has a normal mood and affect.          Assessment & Plan:

## 2012-02-18 NOTE — Assessment & Plan Note (Signed)
Reviewed health habits including diet and exercise and skin cancer prevention Also reviewed health mt list, fam hx and immunizations  Rev wellness labs in detail  Disc need for wt loss for better health

## 2012-02-18 NOTE — Assessment & Plan Note (Addendum)
Exam with pap today Menses are irregular Disc imp of wt loss Will contact us if she skips multiple menses in a row  Not on contraception- aware she could become pregnant

## 2012-02-18 NOTE — Assessment & Plan Note (Signed)
Pt gets lesions with scars under arms/ groin area Disc use of antibacterial soap/ water - keeping clean and dry

## 2012-02-18 NOTE — Assessment & Plan Note (Signed)
Scheduled annual screening mammogram Nl breast exam today  Encouraged monthly self exams   

## 2012-02-18 NOTE — Patient Instructions (Addendum)
We will refer you for mammogram at check out  Keep working on healthy diet and exercise  Use ice/ frozen can for foot pain and always wear supportive shoes  Let me know if foot pain worsens  You may want to get fitted for better athletic shoes or orthotics

## 2012-02-21 ENCOUNTER — Other Ambulatory Visit: Payer: Self-pay | Admitting: Family Medicine

## 2012-02-21 NOTE — Telephone Encounter (Signed)
Ok to refill 

## 2012-02-21 NOTE — Telephone Encounter (Signed)
Will refill electronically  

## 2012-03-07 ENCOUNTER — Ambulatory Visit
Admission: RE | Admit: 2012-03-07 | Discharge: 2012-03-07 | Disposition: A | Discharge status: Home / Self Care | Payer: Commercial Indemnity | Source: Ambulatory Visit | Attending: Family Medicine | Admitting: Family Medicine

## 2012-03-07 DIAGNOSIS — Z1231 Encounter for screening mammogram for malignant neoplasm of breast: Secondary | ICD-10-CM

## 2012-04-12 ENCOUNTER — Telehealth: Payer: Self-pay | Admitting: Physical Medicine & Rehabilitation

## 2012-04-12 NOTE — Telephone Encounter (Signed)
Error. Wrong patient.

## 2012-04-12 NOTE — Telephone Encounter (Signed)
Needs another MBB.  Schedule?

## 2012-05-26 ENCOUNTER — Ambulatory Visit (INDEPENDENT_AMBULATORY_CARE_PROVIDER_SITE_OTHER): Payer: Commercial Indemnity | Admitting: Family Medicine

## 2012-05-26 ENCOUNTER — Encounter: Payer: Self-pay | Admitting: Family Medicine

## 2012-05-26 VITALS — BP 138/86 | HR 84 | Temp 98.4°F | Ht 68.0 in | Wt 247.2 lb

## 2012-05-26 DIAGNOSIS — N912 Amenorrhea, unspecified: Secondary | ICD-10-CM

## 2012-05-26 NOTE — Assessment & Plan Note (Signed)
For 4 mo  Pt is obese and just started aggressive new exercise regime with running  Lab today If all neg will likely try progestin challenge

## 2012-05-26 NOTE — Patient Instructions (Addendum)
Labs today for amenorrhea (stopping period) Will update you on Monday If all is normal - then we will make a plan to re start it with a hormone called progesterone

## 2012-05-26 NOTE — Progress Notes (Signed)
Subjective:    Patient ID: Ashley Mercado, female    DOB: Feb 28, 1971, 41 y.o.   MRN: [id number]  HPI No regular menses - just spotting for past 4 months without a period   Is sexually active-no birth control but condoms   No cramps or sore breasts  Some night sweats at times- not every time  Mother had menopause in her 2s   No weight change She did run a 5K - proud of that  Has a trainer Exercise 3 days a week at least with running -this is new  Patient Active Problem List  Diagnosis  . HSV  . THROMBOCYTOSIS  . KELOID SCAR  . FOLLICULITIS  . HIDRADENITIS SUPPURATIVA  . FATIGUE  . Depression  . Routine general medical examination at a health care facility  . Routine gynecological examination  . Obesity  . Elevated blood-pressure reading without diagnosis of hypertension  . Other screening mammogram  . Plantar fasciitis of left foot  . Amenorrhea   Past Medical History  Diagnosis Date  . Menorrhagia   . Keloid scar   . Folliculitis   . HSV infection   . Hydradenitis    No past surgical history on file. History  Substance Use Topics  . Smoking status: Never Smoker   . Smokeless tobacco: Not on file  . Alcohol Use: Yes     Comment: rare   Family History  Problem Relation Age of Onset  . Hypertension Mother   . Diabetes Mother   . Hypertension Father   . Diabetes Father   . Cancer Maternal Uncle     lymphoma  . Cancer Maternal Grandmother     ovarian   Allergies  Allergen Reactions  . Other     artificial sweeteners   Current Outpatient Prescriptions on File Prior to Visit  Medication Sig Dispense Refill  . diclofenac sodium (VOLTAREN) 1 % GEL Apply topically. Apply small amount to affected knee up to three times a day as needed.       Marland Kitchen ibuprofen (ADVIL,MOTRIN) 200 MG tablet Take 200 mg by mouth as directed.        . Multiple Vitamin (MULTIVITAMIN) tablet Take 2 tablets by mouth daily.        . valACYclovir (VALTREX) 500 MG tablet Take 500 mg by  mouth daily.         Patient Active Problem List  Diagnosis  . HSV  . THROMBOCYTOSIS  . KELOID SCAR  . FOLLICULITIS  . HIDRADENITIS SUPPURATIVA  . FATIGUE  . Depression  . Routine general medical examination at a health care facility  . Routine gynecological examination  . Obesity  . Elevated blood-pressure reading without diagnosis of hypertension  . Other screening mammogram  . Plantar fasciitis of left foot   Past Medical History  Diagnosis Date  . Menorrhagia   . Keloid scar   . Folliculitis   . HSV infection   . Hydradenitis    No past surgical history on file. History  Substance Use Topics  . Smoking status: Never Smoker   . Smokeless tobacco: Not on file  . Alcohol Use: Yes     Comment: rare   Family History  Problem Relation Age of Onset  . Hypertension Mother   . Diabetes Mother   . Hypertension Father   . Diabetes Father   . Cancer Maternal Uncle     lymphoma  . Cancer Maternal Grandmother     ovarian   Allergies  Allergen Reactions  .  Other     artificial sweeteners   Current Outpatient Prescriptions on File Prior to Visit  Medication Sig Dispense Refill  . diclofenac sodium (VOLTAREN) 1 % GEL Apply topically. Apply small amount to affected knee up to three times a day as needed.       Marland Kitchen ibuprofen (ADVIL,MOTRIN) 200 MG tablet Take 200 mg by mouth as directed.        . Multiple Vitamin (MULTIVITAMIN) tablet Take 2 tablets by mouth daily.        . valACYclovir (VALTREX) 500 MG tablet Take 500 mg by mouth daily.            Review of Systems Review of Systems  Constitutional: Negative for fever, appetite change, fatigue and unexpected weight change.  Eyes: Negative for pain and visual disturbance.  Respiratory: Negative for cough and shortness of breath.   Cardiovascular: Negative for cp or palpitations    Gastrointestinal: Negative for nausea, diarrhea and constipation.  Genitourinary: Negative for urgency and frequency. neg for vaginal  discharge , neg for breast pain or lactation  Skin: Negative for pallor or rash   Neurological: Negative for weakness, light-headedness, numbness and headaches.  Hematological: Negative for adenopathy. Does not bruise/bleed easily.  Psychiatric/Behavioral: Negative for dysphoric mood. The patient is not nervous/anxious.         Objective:   Physical Exam  Constitutional: She appears well-developed and well-nourished. No distress.       obese and well appearing   HENT:  Head: Normocephalic and atraumatic.  Mouth/Throat: Oropharynx is clear and moist.  Eyes: Conjunctivae normal and EOM are normal. Pupils are equal, round, and reactive to light.  Neck: Normal range of motion. Neck supple. No thyromegaly present.  Cardiovascular: Normal rate and regular rhythm.   Pulmonary/Chest: Effort normal and breath sounds normal.  Abdominal: Soft. Bowel sounds are normal. She exhibits no distension and no mass. There is no tenderness.       No suprapubic tenderness or fullness    Neurological: She is alert.  Skin: Skin is warm and dry. No rash noted. No erythema. No pallor.  Psychiatric: She has a normal mood and affect.          Assessment & Plan:

## 2012-05-27 LAB — LUTEINIZING HORMONE: LH: 2.3 m[IU]/mL

## 2012-05-27 LAB — HCG, SERUM, QUALITATIVE: Preg, Serum: NEGATIVE

## 2012-05-28 ENCOUNTER — Telehealth: Payer: Self-pay | Admitting: Family Medicine

## 2012-05-28 MED ORDER — MEDROXYPROGESTERONE ACETATE 10 MG PO TABS: 10.0000 mg | ORAL_TABLET | Freq: Every day | ORAL | Status: DC

## 2012-05-28 NOTE — Telephone Encounter (Signed)
Labs are all normal  I want to try a progesterone challenge to see is period will re start Px written for call in   Call if no menses starts a week after finishing this

## 2012-05-29 NOTE — Telephone Encounter (Signed)
Pt notified and Rx called in as prescribed 

## 2012-08-19 ENCOUNTER — Other Ambulatory Visit: Payer: Self-pay

## 2013-01-10 ENCOUNTER — Other Ambulatory Visit: Payer: Self-pay | Admitting: Family Medicine

## 2013-02-15 ENCOUNTER — Telehealth: Payer: Self-pay | Admitting: Family Medicine

## 2013-02-15 DIAGNOSIS — Z0000 Encounter for general adult medical examination without abnormal findings: Secondary | ICD-10-CM

## 2013-02-15 NOTE — Telephone Encounter (Signed)
Message copied by Judy Pimple on Thu Feb 15, 2013  3:17 PM ------      Message from: Alvina Chou      Created: Wed Feb 07, 2013  3:16 PM      Regarding: lab orders for Friday, 8.15.14       Patient is scheduled for CPX labs, please order future labs, Thanks , Terri       ------

## 2013-02-16 ENCOUNTER — Other Ambulatory Visit (INDEPENDENT_AMBULATORY_CARE_PROVIDER_SITE_OTHER): Payer: Commercial Indemnity

## 2013-02-16 DIAGNOSIS — Z0000 Encounter for general adult medical examination without abnormal findings: Secondary | ICD-10-CM

## 2013-02-16 LAB — COMPREHENSIVE METABOLIC PANEL
ALT: 12 U/L (ref 0–35)
AST: 16 U/L (ref 0–37)
Albumin: 3.7 g/dL (ref 3.5–5.2)
Alkaline Phosphatase: 79 U/L (ref 39–117)
Glucose, Bld: 88 mg/dL (ref 70–99)
Potassium: 4.4 mEq/L (ref 3.5–5.1)
Sodium: 140 mEq/L (ref 135–145)
Total Protein: 7.6 g/dL (ref 6.0–8.3)

## 2013-02-16 LAB — LIPID PANEL
Cholesterol: 155 mg/dL (ref 0–200)
LDL Cholesterol: 100 mg/dL — ABNORMAL HIGH (ref 0–99)
Total CHOL/HDL Ratio: 4

## 2013-02-16 LAB — CBC WITH DIFFERENTIAL/PLATELET
Basophils Absolute: 0 10*3/uL (ref 0.0–0.1)
Eosinophils Relative: 1 % (ref 0.0–5.0)
MCV: 87.1 fl (ref 78.0–100.0)
Monocytes Absolute: 0.4 10*3/uL (ref 0.1–1.0)
Neutrophils Relative %: 56.6 % (ref 43.0–77.0)
Platelets: 361 10*3/uL (ref 150.0–400.0)
WBC: 5.6 10*3/uL (ref 4.5–10.5)

## 2013-02-16 LAB — TSH: TSH: 1.53 u[IU]/mL (ref 0.35–5.50)

## 2013-02-23 ENCOUNTER — Other Ambulatory Visit (HOSPITAL_COMMUNITY)
Admission: RE | Admit: 2013-02-23 | Discharge: 2013-02-23 | Disposition: A | Discharge status: Home / Self Care | Payer: Commercial Indemnity | Source: Ambulatory Visit | Attending: Family Medicine | Admitting: Family Medicine

## 2013-02-23 ENCOUNTER — Ambulatory Visit (INDEPENDENT_AMBULATORY_CARE_PROVIDER_SITE_OTHER): Payer: Commercial Indemnity | Admitting: Family Medicine

## 2013-02-23 ENCOUNTER — Encounter: Payer: Self-pay | Admitting: Family Medicine

## 2013-02-23 VITALS — BP 110/76 | HR 86 | Temp 99.1°F | Ht 67.0 in | Wt 240.2 lb

## 2013-02-23 DIAGNOSIS — Z01419 Encounter for gynecological examination (general) (routine) without abnormal findings: Secondary | ICD-10-CM

## 2013-02-23 DIAGNOSIS — Z1151 Encounter for screening for human papillomavirus (HPV): Secondary | ICD-10-CM | POA: Insufficient documentation

## 2013-02-23 DIAGNOSIS — Z0000 Encounter for general adult medical examination without abnormal findings: Secondary | ICD-10-CM

## 2013-02-23 MED ORDER — ETONOGESTREL-ETHINYL ESTRADIOL 0.12-0.015 MG/24HR VA RING: VAGINAL_RING | VAGINAL | Status: DC

## 2013-02-23 NOTE — Patient Instructions (Addendum)
Try the oral contraceptive to regulate periods- I sent nuva ring to your pharmacy  If side effects please let me know Take care of yourself - keep exercising and loosing weight

## 2013-02-23 NOTE — Progress Notes (Signed)
Subjective:    Patient ID: Ashley Mercado, female    DOB: 1970/08/29, 42 y.o.   MRN: [id number]  HPI Here for health maintenance exam and to review chronic medical problems    Doing ok overall  She craves sugar  Periods are off- ? perimenopausal   Wt is down 7 lb with bmi of 37 She has been working on weight loss  Exercise / running - gets in 3 days per week   Flu vaccine - had that last fall  Will get one this fall   mammogrm 9/13- [facility] - will schedule her own mammogram  Self exam-no lumps or changes   Pap 8/13 nl- due for her pap  Periods are still not regular -that is difficult to deal with  Intermittent amenorrhea - did a progesterone challenge Last period was very light - 40 days ago  She does get bloated   She would like to start OC for now -has been on one before and no problems  Tends to get nauseated easily    Td 6/08  Labs  Lab Results  Component Value Date   CHOL 155 02/16/2013   CHOL 178 02/14/2012   CHOL 156 02/11/2011   Lab Results  Component Value Date   HDL 44.00 02/16/2013   HDL 40.98 02/14/2012   HDL 11.91 02/11/2011   Lab Results  Component Value Date   LDLCALC 100* 02/16/2013   LDLCALC 113* 02/14/2012   LDLCALC 95 02/11/2011   Lab Results  Component Value Date   TRIG 53.0 02/16/2013   TRIG 67.0 02/14/2012   TRIG 68.0 02/11/2011   Lab Results  Component Value Date   CHOLHDL 4 02/16/2013   CHOLHDL 3 02/14/2012   CHOLHDL 3 02/11/2011   No results found for this basename: LDLDIRECT   at goal overall  Less exercise - did dec the HDL   Patient Active Problem List   Diagnosis Date Noted  . Amenorrhea 05/26/2012  . Other screening mammogram 02/18/2012  . Plantar fasciitis of left foot 02/18/2012  . Obesity 10/20/2011  . Elevated blood-pressure reading without diagnosis of hypertension 10/20/2011  . Routine gynecological examination 02/17/2011  . Routine general medical examination at a health care facility 02/06/2011  .  Depression 11/04/2010  . HIDRADENITIS SUPPURATIVA 05/12/2010  . 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 THROMBOCYTOSIS 11/30/2006  . FATIGUE 11/30/2006  . KELOID SCAR 11/29/2006  . FOLLICULITIS 11/29/2006   Past Medical History  Diagnosis Date  . Menorrhagia   . Keloid scar   . Folliculitis   . HSV infection   . Hydradenitis    No past surgical history on file. History  Substance Use Topics  . Smoking status: Never Smoker   . Smokeless tobacco: Not on file  . Alcohol Use: Yes     Comment: rare   Family History  Problem Relation Age of Onset  . Hypertension Mother   . Diabetes Mother   . Hypertension Father   . Diabetes Father   . Cancer Maternal Uncle     lymphoma  . Cancer Maternal Grandmother     ovarian   Allergies  Allergen Reactions  . Other     artificial sweeteners  . Sulfa Antibiotics Rash   Current Outpatient Prescriptions on File Prior to Visit  Medication Sig Dispense Refill  . diclofenac sodium (VOLTAREN) 1 % GEL Apply topically. Apply small amount to affected knee up to three times a day as needed.       Marland Kitchen  ibuprofen (ADVIL,MOTRIN) 200 MG tablet Take 200 mg by mouth as directed.        . valACYclovir (VALTREX) 500 MG tablet TAKE 1 TABLET (500 MG TOTAL) BY MOUTH DAILY.  30 tablet  1   No current facility-administered medications on file prior to visit.      Review of Systems    Review of Systems  Constitutional: Negative for fever, appetite change, fatigue and unexpected weight change.  Eyes: Negative for pain and visual disturbance.  Respiratory: Negative for cough and shortness of breath.   Cardiovascular: Negative for cp or palpitations    Gastrointestinal: Negative for nausea, diarrhea and constipation.  Genitourinary: Negative for urgency and frequency. pos for irregular menses Skin: Negative for pallor or rash   Neurological: Negative for weakness, light-headedness, numbness and headaches.  Hematological: Negative for adenopathy. Does not bruise/bleed easily.   Psychiatric/Behavioral: Negative for dysphoric mood. The patient is not nervous/anxious.      Objective:   Physical Exam  Constitutional: She appears well-developed and well-nourished. No distress.  obese and well appearing   HENT:  Head: Normocephalic and atraumatic.  Right Ear: External ear normal.  Left Ear: External ear normal.  Nose: Nose normal.  Mouth/Throat: Oropharynx is clear and moist.  Eyes: Conjunctivae and EOM are normal. Pupils are equal, round, and reactive to light. Right eye exhibits no discharge. Left eye exhibits no discharge. No scleral icterus.  Neck: Normal range of motion. Neck supple. No JVD present. Carotid bruit is not present. No thyromegaly present.  Cardiovascular: Normal rate, regular rhythm, normal heart sounds and intact distal pulses.  Exam reveals no gallop.   Pulmonary/Chest: Breath sounds normal. No respiratory distress. She has no wheezes. She exhibits no tenderness.  Abdominal: Soft. Bowel sounds are normal. She exhibits no distension, no abdominal bruit and no mass. There is no tenderness.  Genitourinary: Rectum normal, vagina normal and uterus normal. No breast swelling, tenderness, discharge or bleeding. There is no rash, tenderness or lesion on the right labia. There is no rash, tenderness or lesion on the left labia. Uterus is not enlarged and not tender. Cervix exhibits no motion tenderness, no discharge and no friability. Right adnexum displays no mass, no tenderness and no fullness. Left adnexum displays no mass, no tenderness and no fullness. No bleeding around the vagina. No vaginal discharge found.  Breast exam: No mass, nodules, thickening, tenderness, bulging, retraction, inflamation, nipple discharge or skin changes noted.  No axillary or clavicular LA.  Chaperoned exam.    Musculoskeletal: She exhibits no edema and no tenderness.  Lymphadenopathy:    She has no cervical adenopathy.  Neurological: She is alert. She has normal reflexes. No  cranial nerve deficit. She exhibits normal muscle tone. Coordination normal.  Skin: Skin is warm and dry. No rash noted. No erythema. No pallor.  Psychiatric: She has a normal mood and affect.          Assessment & Plan:

## 2013-02-25 NOTE — Assessment & Plan Note (Signed)
Exam done  Px nuva ring to regulate menses - (perimenopausal)-will update if no improvement  Long discussion re: way to use nuva ring  properly and avoidance of smoking  Risks of blood clots outlined as well as possible side eff Pt aware that this does not prevent stds and condoms should still be used inst that it may take up to 3 months for menses to fall into rhythm or side eff to stop  Adv to call if problems or questions

## 2013-02-25 NOTE — Assessment & Plan Note (Signed)
Reviewed health habits including diet and exercise and skin cancer prevention Also reviewed health mt list, fam hx and immunizations   Wellness labs reviewed  

## 2013-03-07 ENCOUNTER — Other Ambulatory Visit: Payer: Self-pay

## 2013-03-07 DIAGNOSIS — Z1231 Encounter for screening mammogram for malignant neoplasm of breast: Secondary | ICD-10-CM

## 2013-03-23 ENCOUNTER — Ambulatory Visit
Admission: RE | Admit: 2013-03-23 | Discharge: 2013-03-23 | Disposition: A | Discharge status: Home / Self Care | Payer: Commercial Indemnity | Source: Ambulatory Visit

## 2013-03-23 DIAGNOSIS — Z1231 Encounter for screening mammogram for malignant neoplasm of breast: Secondary | ICD-10-CM

## 2013-05-10 ENCOUNTER — Other Ambulatory Visit: Payer: Self-pay

## 2013-11-29 ENCOUNTER — Ambulatory Visit (INDEPENDENT_AMBULATORY_CARE_PROVIDER_SITE_OTHER): Payer: Commercial Indemnity | Admitting: Internal Medicine

## 2013-11-29 ENCOUNTER — Encounter: Payer: Self-pay | Admitting: Internal Medicine

## 2013-11-29 VITALS — BP 128/92 | HR 92 | Temp 98.6°F | Wt 231.5 lb

## 2013-11-29 DIAGNOSIS — J309 Allergic rhinitis, unspecified: Secondary | ICD-10-CM

## 2013-11-29 MED ORDER — METHYLPREDNISOLONE ACETATE 80 MG/ML IJ SUSP
80.0000 mg | Freq: Once | INTRAMUSCULAR | Status: AC
  Administered 2013-11-29: 80 mg via INTRAMUSCULAR

## 2013-11-29 MED ORDER — FLUTICASONE PROPIONATE 50 MCG/ACT NA SUSP: 2.0000 | Freq: Every day | NASAL | Status: DC

## 2013-11-29 MED ORDER — FEXOFENADINE HCL 30 MG PO TABS: 30.0000 mg | ORAL_TABLET | Freq: Two times a day (BID) | ORAL | Status: DC

## 2013-11-29 NOTE — Progress Notes (Signed)
Pre visit review using our clinic review tool, if applicable. No additional management support is needed unless otherwise documented below in the visit note. 

## 2013-11-29 NOTE — Progress Notes (Signed)
HPI  Pt presents to the clinic today with c/o cough and chest congestion. This started 1 month ago. The cough is productive of clear mucous. She has had some associated chills and fatigue but denies fever or body ache. She also reports runny nose with clear mucous. She has tried OTC Dayquil, Nyquil, Coricidin and Flonase without any relief, She has no history of allergies or breathing problems. She has not had sick contacts that she is aware of.  Review of Systems      Past Medical History  Diagnosis Date  . Menorrhagia   . Keloid scar   . Folliculitis   . HSV infection   . Hydradenitis     Family History  Problem Relation Age of Onset  . Hypertension Mother   . Diabetes Mother   . Hypertension Father   . Diabetes Father   . Cancer Maternal Uncle     lymphoma  . Cancer Maternal Grandmother     ovarian    History   Social History  . Marital Status: Single    Spouse Name: N/A    Number of Children: N/A  . Years of Education: N/A   Occupational History  . [employer]    Social History Main Topics  . Smoking status: Never Smoker   . Smokeless tobacco: Not on file  . Alcohol Use: Yes     Comment: rare  . Drug Use: No  . Sexual Activity: Not on file   Other Topics Concern  . Not on file   Social History Narrative   Private cell: [phone].  Designated party form signed on 01/26/10 appointing no one.  May leave message on cell phone.    Allergies  Allergen Reactions  . Other     artificial sweeteners  . Sulfa Antibiotics Rash     Constitutional: Positive headache, fatigue. Denies fever or abrupt weight changes.  HEENT:  Positive runny nose, sore throat. Denies eye redness, eye pain, pressure behind the eyes, facial pain, nasal congestion, ear pain, ringing in the ears, wax buildup, or bloody nose. Respiratory: Positive cough. Denies difficulty breathing or shortness of breath.  Cardiovascular: Denies chest pain, chest tightness, palpitations or swelling in  the hands or feet.   No other specific complaints in a complete review of systems (except as listed in HPI above).  Objective:   BP 128/92  Pulse 92  Temp(Src) 98.6 F (37 C) (Oral)  Wt 231 lb 8 oz (105.008 kg)  SpO2 98% Wt Readings from Last 3 Encounters:  11/29/13 231 lb 8 oz (105.008 kg)  02/23/13 240 lb 4 oz (108.977 kg)  05/26/12 247 lb 4 oz (112.152 kg)     General: Appears her stated age, well developed, well nourished in NAD. HEENT: Head: normal shape and size; Eyes: sclera white, no icterus, conjunctiva pink, PERRLA and EOMs intact; Ears: Tm's gray and intact, normal light reflex; Nose: mucosa boggy and moist, septum midline; Throat/Mouth: + PND. Teeth present, mucosa erythematous and moist, no exudate noted, no lesions or ulcerations noted.  Neck:  Neck supple, trachea midline. No massses, lumps or thyromegaly present.  Cardiovascular: Normal rate and rhythm. S1,S2 noted.  No murmur, rubs or gallops noted. No JVD or BLE edema. No carotid bruits noted. Pulmonary/Chest: Normal effort and positive vesicular breath sounds. No respiratory distress. No wheezes, rales or ronchi noted.      Assessment & Plan:   Allergic Rhinitis:  Get some rest and drink plenty of water Do salt water gargles for  the sore throat eRx for allegra and flonase 80 mg Depo IM today  RTC as needed or if symptoms persist.

## 2013-11-29 NOTE — Patient Instructions (Signed)
Allergic Rhinitis Allergic rhinitis is when the mucous membranes in the nose respond to allergens. Allergens are particles in the air that cause your body to have an allergic reaction. This causes you to release allergic antibodies. Through a chain of events, these eventually cause you to release histamine into the blood stream. Although meant to protect the body, it is this release of histamine that causes your discomfort, such as frequent sneezing, congestion, and an itchy, runny nose.  CAUSES  Seasonal allergic rhinitis (hay fever) is caused by pollen allergens that may come from grasses, trees, and weeds. Year-round allergic rhinitis (perennial allergic rhinitis) is caused by allergens such as house dust mites, pet dander, and mold spores.  SYMPTOMS   Nasal stuffiness (congestion).  Itchy, runny nose with sneezing and tearing of the eyes. DIAGNOSIS  Your health care provider can help you determine the allergen or allergens that trigger your symptoms. If you and your health care provider are unable to determine the allergen, skin or blood testing may be used. TREATMENT  Allergic Rhinitis does not have a cure, but it can be controlled by:  Medicines and allergy shots (immunotherapy).  Avoiding the allergen. Hay fever may often be treated with antihistamines in pill or nasal spray forms. Antihistamines block the effects of histamine. There are over-the-counter medicines that may help with nasal congestion and swelling around the eyes. Check with your health care provider before taking or giving this medicine.  If avoiding the allergen or the medicine prescribed do not work, there are many new medicines your health care provider can prescribe. Stronger medicine may be used if initial measures are ineffective. Desensitizing injections can be used if medicine and avoidance does not work. Desensitization is when a patient is given ongoing shots until the body becomes less sensitive to the allergen.  Make sure you follow up with your health care provider if problems continue. HOME CARE INSTRUCTIONS It is not possible to completely avoid allergens, but you can reduce your symptoms by taking steps to limit your exposure to them. It helps to know exactly what you are allergic to so that you can avoid your specific triggers. SEEK MEDICAL CARE IF:   You have a fever.  You develop a cough that does not stop easily (persistent).  You have shortness of breath.  You start wheezing.  Symptoms interfere with normal daily activities. Document Released: 03/16/2001 Document Revised: 04/11/2013 Document Reviewed: 02/26/2013 [employer] Patient Information 2014 [employer].  

## 2013-11-29 NOTE — Addendum Note (Signed)
Addended by: Lurlean Nanny on: 11/29/2013 04:36 PM   Modules accepted: Orders

## 2014-01-20 ENCOUNTER — Other Ambulatory Visit: Payer: Self-pay | Admitting: Family Medicine

## 2014-02-20 ENCOUNTER — Telehealth: Payer: Self-pay | Admitting: Family Medicine

## 2014-02-20 DIAGNOSIS — Z0000 Encounter for general adult medical examination without abnormal findings: Secondary | ICD-10-CM

## 2014-02-20 NOTE — Telephone Encounter (Signed)
Message copied by Abner Greenspan on Wed Feb 20, 2014  6:42 PM ------      Message from: Ellamae Sia      Created: Tue Feb 19, 2014 12:28 PM      Regarding: Lab orders for Thursday, 8.20.15       Patient is scheduled for CPX labs, please order future labs, Thanks , Terri       ------

## 2014-02-22 ENCOUNTER — Other Ambulatory Visit (INDEPENDENT_AMBULATORY_CARE_PROVIDER_SITE_OTHER): Payer: Commercial Indemnity

## 2014-02-22 DIAGNOSIS — R030 Elevated blood-pressure reading, without diagnosis of hypertension: Secondary | ICD-10-CM

## 2014-02-22 DIAGNOSIS — R5381 Other malaise: Secondary | ICD-10-CM

## 2014-02-22 DIAGNOSIS — R5383 Other fatigue: Secondary | ICD-10-CM

## 2014-02-22 DIAGNOSIS — Z0000 Encounter for general adult medical examination without abnormal findings: Secondary | ICD-10-CM

## 2014-02-22 LAB — CBC WITH DIFFERENTIAL/PLATELET
BASOS PCT: 0.3 % (ref 0.0–3.0)
Basophils Absolute: 0 10*3/uL (ref 0.0–0.1)
EOS ABS: 0 10*3/uL (ref 0.0–0.7)
EOS PCT: 0.7 % (ref 0.0–5.0)
HEMATOCRIT: 41.5 % (ref 36.0–46.0)
Hemoglobin: 14 g/dL (ref 12.0–15.0)
LYMPHS ABS: 1.5 10*3/uL (ref 0.7–4.0)
Lymphocytes Relative: 26.2 % (ref 12.0–46.0)
MCHC: 33.7 g/dL (ref 30.0–36.0)
MCV: 88.6 fl (ref 78.0–100.0)
MONO ABS: 0.5 10*3/uL (ref 0.1–1.0)
Monocytes Relative: 8.1 % (ref 3.0–12.0)
NEUTROS ABS: 3.8 10*3/uL (ref 1.4–7.7)
Neutrophils Relative %: 64.7 % (ref 43.0–77.0)
Platelets: 431 10*3/uL — ABNORMAL HIGH (ref 150.0–400.0)
RBC: 4.69 Mil/uL (ref 3.87–5.11)
RDW: 13.9 % (ref 11.5–15.5)
WBC: 5.8 10*3/uL (ref 4.0–10.5)

## 2014-02-22 LAB — COMPREHENSIVE METABOLIC PANEL
ALK PHOS: 78 U/L (ref 39–117)
ALT: 18 U/L (ref 0–35)
AST: 18 U/L (ref 0–37)
Albumin: 3.6 g/dL (ref 3.5–5.2)
BILIRUBIN TOTAL: 0.7 mg/dL (ref 0.2–1.2)
BUN: 10 mg/dL (ref 6–23)
CO2: 28 mEq/L (ref 19–32)
CREATININE: 0.8 mg/dL (ref 0.4–1.2)
Calcium: 9.1 mg/dL (ref 8.4–10.5)
Chloride: 104 mEq/L (ref 96–112)
GFR: 101.07 mL/min (ref >60.00)
GLUCOSE: 88 mg/dL (ref 70–99)
Potassium: 3.4 mEq/L — ABNORMAL LOW (ref 3.5–5.1)
Sodium: 142 mEq/L (ref 135–145)
Total Protein: 7.4 g/dL (ref 6.0–8.3)

## 2014-02-22 LAB — LIPID PANEL
CHOL/HDL RATIO: 3
Cholesterol: 184 mg/dL (ref 0–200)
HDL: 60.6 mg/dL (ref >39.00)
LDL CALC: 111 mg/dL — AB (ref 0–99)
NonHDL: 123.4
TRIGLYCERIDES: 63 mg/dL (ref 0.0–149.0)
VLDL: 12.6 mg/dL (ref 0.0–40.0)

## 2014-02-22 LAB — TSH: TSH: 0.91 u[IU]/mL (ref 0.35–4.50)

## 2014-03-01 ENCOUNTER — Encounter: Payer: Self-pay | Admitting: Family Medicine

## 2014-03-01 ENCOUNTER — Ambulatory Visit (INDEPENDENT_AMBULATORY_CARE_PROVIDER_SITE_OTHER): Payer: Commercial Indemnity | Admitting: Family Medicine

## 2014-03-01 VITALS — BP 130/88 | HR 90 | Temp 98.4°F | Ht 66.75 in | Wt 232.8 lb

## 2014-03-01 DIAGNOSIS — Z0000 Encounter for general adult medical examination without abnormal findings: Secondary | ICD-10-CM

## 2014-03-01 DIAGNOSIS — E669 Obesity, unspecified: Secondary | ICD-10-CM

## 2014-03-01 DIAGNOSIS — F430 Acute stress reaction: Secondary | ICD-10-CM

## 2014-03-01 DIAGNOSIS — R030 Elevated blood-pressure reading, without diagnosis of hypertension: Secondary | ICD-10-CM

## 2014-03-01 NOTE — Patient Instructions (Signed)
Stop at check out for referral to counseling for stress  Get your flu shot at work  Don't forget to schedule your mammogram   Keep an eye on your blood pressure  Take a look at the [place]  Work on weight loss when you can    DASH Eating Plan DASH stands for "Dietary Approaches to Stop Hypertension." The DASH eating plan is a healthy eating plan that has been shown to reduce high blood pressure (hypertension). Additional health benefits may include reducing the risk of type 2 diabetes mellitus, heart disease, and stroke. The DASH eating plan may also help with weight loss. WHAT DO I NEED TO KNOW ABOUT THE DASH EATING PLAN? For the DASH eating plan, you will follow these general guidelines:  Choose foods with a percent daily value for sodium of less than 5% (as listed on the food label).  Use salt-free seasonings or herbs instead of table salt or sea salt.  Check with your health care provider or pharmacist before using salt substitutes.  Eat lower-sodium products, often labeled as "lower sodium" or "no salt added."  Eat fresh foods.  Eat more vegetables, fruits, and low-fat dairy products.  Choose whole grains. Look for the word "whole" as the first word in the ingredient list.  Choose fish and skinless chicken or [place] more often than red meat. Limit fish, poultry, and meat to 6 oz (170 g) each day.  Limit sweets, desserts, sugars, and sugary drinks.  Choose heart-healthy fats.  Limit cheese to 1 oz (28 g) per day.  Eat more home-cooked food and less restaurant, buffet, and fast food.  Limit fried foods.  Cook foods using methods other than frying.  Limit canned vegetables. If you do use them, rinse them well to decrease the sodium.  When eating at a restaurant, ask that your food be prepared with less salt, or no salt if possible. WHAT FOODS CAN I EAT? Seek help from a dietitian for individual calorie needs. Grains Whole grain or whole wheat bread. Brown rice.  Whole grain or whole wheat pasta. Quinoa, bulgur, and whole grain cereals. Low-sodium cereals. Corn or whole wheat flour tortillas. Whole grain cornbread. Whole grain crackers. Low-sodium crackers. Vegetables Fresh or frozen vegetables (raw, steamed, roasted, or grilled). Low-sodium or reduced-sodium tomato and vegetable juices. Low-sodium or reduced-sodium tomato sauce and paste. Low-sodium or reduced-sodium canned vegetables.  Fruits All fresh, canned (in natural juice), or frozen fruits. Meat and Other Protein Products Ground beef (85% or leaner), grass-fed beef, or beef trimmed of fat. Skinless chicken or [place]. Ground chicken or [place]. Pork trimmed of fat. All fish and seafood. Eggs. Dried beans, peas, or lentils. Unsalted nuts and seeds. Unsalted canned beans. Dairy Low-fat dairy products, such as skim or 1% milk, 2% or reduced-fat cheeses, low-fat ricotta or cottage cheese, or plain low-fat yogurt. Low-sodium or reduced-sodium cheeses. Fats and Oils Tub margarines without trans fats. Light or reduced-fat mayonnaise and salad dressings (reduced sodium). Avocado. Safflower, olive, or canola oils. Natural peanut or almond butter. Other Unsalted popcorn and pretzels. The items listed above may not be a complete list of recommended foods or beverages. Contact your dietitian for more options. WHAT FOODS ARE NOT RECOMMENDED? Grains White bread. White pasta. White rice. Refined cornbread. Bagels and croissants. Crackers that contain trans fat. Vegetables Creamed or fried vegetables. Vegetables in a cheese sauce. Regular canned vegetables. Regular canned tomato sauce and paste. Regular tomato and vegetable juices. Fruits Dried fruits. Canned fruit in light or  heavy syrup. Fruit juice. Meat and Other Protein Products Fatty cuts of meat. Ribs, chicken wings, bacon, sausage, bologna, salami, chitterlings, fatback, hot dogs, bratwurst, and packaged luncheon meats. Salted nuts and seeds. Canned  beans with salt. Dairy Whole or 2% milk, cream, half-and-half, and cream cheese. Whole-fat or sweetened yogurt. Full-fat cheeses or blue cheese. Nondairy creamers and whipped toppings. Processed cheese, cheese spreads, or cheese curds. Condiments Onion and garlic salt, seasoned salt, table salt, and sea salt. Canned and packaged gravies. Worcestershire sauce. Tartar sauce. Barbecue sauce. Teriyaki sauce. Soy sauce, including reduced sodium. Steak sauce. Fish sauce. Oyster sauce. Cocktail sauce. Horseradish. Ketchup and mustard. Meat flavorings and tenderizers. Bouillon cubes. Hot sauce. Tabasco sauce. Marinades. Taco seasonings. Relishes. Fats and Oils Butter, stick margarine, lard, shortening, ghee, and bacon fat. Coconut, palm kernel, or palm oils. Regular salad dressings. Other Pickles and olives. Salted popcorn and pretzels. The items listed above may not be a complete list of foods and beverages to avoid. Contact your dietitian for more information. WHERE CAN I FIND MORE INFORMATION? National Heart, Lung, and Blood Institute: travelstabloid.com Document Released: 06/10/2011 Document Revised: 11/05/2013 Document Reviewed: 04/25/2013 [facility] Patient Information 2015 [place], [place]. This information is not intended to replace advice given to you by your health care provider. Make sure you discuss any questions you have with your health care provider.

## 2014-03-01 NOTE — Progress Notes (Signed)
Subjective:    Patient ID: Ashley Mercado, female    DOB: [birth year], 43 y.o.   MRN: [id number]  HPI Here for health mt exam   Is tired in general and not sleeping well  She takes otc sleep aids - vics has a brand  Not sleeping because of stress  It is all work related for the most part  Working very long hours  She would like to get a new - job -she has kept her eyes open  Has cut back on people - and not enough employees   Wt is stable - not up or down  bmi is 36 She is trying to eat right  Some walking -but not time to do enough   Flu vaccine - will get free at work   Td 6/08  Mammogram 9/14 - was ok  Self exam - no lumps or changes   Pap nl 8/14 - reflex hpv neg  No abn paps  No new partners  Will skip pap this year   Results for orders placed in visit on 02/22/14  CBC WITH DIFFERENTIAL      Result Value Ref Range   WBC 5.8  4.0 - 10.5 K/uL   RBC 4.69  3.87 - 5.11 Mil/uL   Hemoglobin 14.0  12.0 - 15.0 g/dL   HCT 41.5  36.0 - 46.0 %   MCV 88.6  78.0 - 100.0 fl   MCHC 33.7  30.0 - 36.0 g/dL   RDW 13.9  11.5 - 15.5 %   Platelets 431.0 (*) 150.0 - 400.0 K/uL   Neutrophils Relative % 64.7  43.0 - 77.0 %   Lymphocytes Relative 26.2  12.0 - 46.0 %   Monocytes Relative 8.1  3.0 - 12.0 %   Eosinophils Relative 0.7  0.0 - 5.0 %   Basophils Relative 0.3  0.0 - 3.0 %   Neutro Abs 3.8  1.4 - 7.7 K/uL   Lymphs Abs 1.5  0.7 - 4.0 K/uL   Monocytes Absolute 0.5  0.1 - 1.0 K/uL   Eosinophils Absolute 0.0  0.0 - 0.7 K/uL   Basophils Absolute 0.0  0.0 - 0.1 K/uL  COMPREHENSIVE METABOLIC PANEL      Result Value Ref Range   Sodium 142  135 - 145 mEq/L   Potassium 3.4 (*) 3.5 - 5.1 mEq/L   Chloride 104  96 - 112 mEq/L   CO2 28  19 - 32 mEq/L   Glucose, Bld 88  70 - 99 mg/dL   BUN 10  6 - 23 mg/dL   Creatinine, Ser 0.8  0.4 - 1.2 mg/dL   Total Bilirubin 0.7  0.2 - 1.2 mg/dL   Alkaline Phosphatase 78  39 - 117 U/L   AST 18  0 - 37 U/L   ALT 18  0 - 35 U/L   Total  Protein 7.4  6.0 - 8.3 g/dL   Albumin 3.6  3.5 - 5.2 g/dL   Calcium 9.1  8.4 - 10.5 mg/dL   GFR 101.07  >60.00 mL/min  LIPID PANEL      Result Value Ref Range   Cholesterol 184  0 - 200 mg/dL   Triglycerides 63.0  0.0 - 149.0 mg/dL   HDL 60.60  >39.00 mg/dL   VLDL 12.6  0.0 - 40.0 mg/dL   LDL Cholesterol 111 (*) 0 - 99 mg/dL   Total CHOL/HDL Ratio 3     NonHDL 123.40    TSH  Result Value Ref Range   TSH 0.91  0.35 - 4.50 uIU/mL    occ has loose bowels - K is slt low   Cholesterol looks good  Is drinking smoothies with spinach and kale and fruit    Patient Active Problem List   Diagnosis Date Noted  . Stress reaction 03/01/2014  . Amenorrhea 05/26/2012  . Other screening mammogram 02/18/2012  . Plantar fasciitis of left foot 02/18/2012  . Obesity 10/20/2011  . Elevated blood-pressure reading without diagnosis of hypertension 10/20/2011  . Routine gynecological examination 02/17/2011  . Routine general medical examination at a health care facility 02/06/2011  . Depression 11/04/2010  . HIDRADENITIS SUPPURATIVA 05/12/2010  . 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 THROMBOCYTOSIS 11/30/2006  . FATIGUE 11/30/2006  . KELOID SCAR 11/29/2006  . FOLLICULITIS 56/21/3086   Past Medical History  Diagnosis Date  . Menorrhagia   . Keloid scar   . Folliculitis   . HSV infection   . Hydradenitis    No past surgical history on file. History  Substance Use Topics  . Smoking status: Never Smoker   . Smokeless tobacco: Not on file  . Alcohol Use: Yes     Comment: rare   Family History  Problem Relation Age of Onset  . Hypertension Mother   . Diabetes Mother   . Hypertension Father   . Diabetes Father   . Cancer Maternal Uncle     lymphoma  . Cancer Maternal Grandmother     ovarian   Allergies  Allergen Reactions  . Other     artificial sweeteners  . Sulfa Antibiotics Rash   Current Outpatient Prescriptions on File Prior to Visit  Medication Sig Dispense Refill  . diclofenac  sodium (VOLTAREN) 1 % GEL Apply topically. Apply small amount to affected knee up to three times a day as needed.       . fexofenadine (ALLEGRA) 30 MG tablet Take 1 tablet (30 mg total) by mouth 2 (two) times daily.  30 tablet  2  . fluticasone (FLONASE) 50 MCG/ACT nasal spray Place 2 sprays into both nostrils daily.  16 g  6  . ibuprofen (ADVIL,MOTRIN) 200 MG tablet Take 200 mg by mouth as directed.        . Multiple Vitamins-Minerals (MULTIVITAMIN GUMMIES ADULT PO) Take 2 each by mouth daily.      Marland Kitchen NUVARING 0.12-0.015 MG/24HR vaginal ring INSERT VAGINALLY AND LEAVE IN PLACE FOR 3 CONSECUTIVE WEEKS, THEN REMOVE FOR 1 WEEK.  1 each  1  . valACYclovir (VALTREX) 500 MG tablet TAKE 1 TABLET (500 MG TOTAL) BY MOUTH DAILY.  30 tablet  1   No current facility-administered medications on file prior to visit.    Review of Systems Review of Systems  Constitutional: Negative for fever, appetite change, fatigue and unexpected weight change.  Eyes: Negative for pain and visual disturbance.  Respiratory: Negative for cough and shortness of breath.   Cardiovascular: Negative for cp or palpitations    Gastrointestinal: Negative for nausea, diarrhea and constipation.  Genitourinary: Negative for urgency and frequency.  Skin: Negative for pallor or rash   Neurological: Negative for weakness, light-headedness, numbness and headaches.  Hematological: Negative for adenopathy. Does not bruise/bleed easily.  Psychiatric/Behavioral: pos for dysphoric mood/ fatigue and poor sleep from stress        Objective:   Physical Exam  Constitutional: She appears well-developed and well-nourished. No distress.  obese and well appearing   HENT:  Head: Normocephalic and atraumatic.  Right Ear: External  ear normal.  Left Ear: External ear normal.  Mouth/Throat: Oropharynx is clear and moist.  Eyes: Conjunctivae and EOM are normal. Pupils are equal, round, and reactive to light. No scleral icterus.  Neck: Normal range  of motion. Neck supple. No JVD present. Carotid bruit is not present. No thyromegaly present.  Cardiovascular: Normal rate, regular rhythm, normal heart sounds and intact distal pulses.  Exam reveals no gallop.   Pulmonary/Chest: Effort normal and breath sounds normal. No respiratory distress. She has no wheezes. She exhibits no tenderness.  Abdominal: Soft. Bowel sounds are normal. She exhibits no distension, no abdominal bruit and no mass. There is no tenderness.  Genitourinary: No breast swelling, tenderness, discharge or bleeding.  Breast exam: No mass, nodules, thickening, tenderness, bulging, retraction, inflamation, nipple discharge or skin changes noted.  No axillary or clavicular LA.      Musculoskeletal: Normal range of motion. She exhibits no edema and no tenderness.  Lymphadenopathy:    She has no cervical adenopathy.  Neurological: She is alert. She has normal reflexes. No cranial nerve deficit. She exhibits normal muscle tone. Coordination normal.  Skin: Skin is warm and dry. No rash noted. No erythema. No pallor.  Psychiatric: Her speech is normal and behavior is normal. Thought content normal. Her mood appears not anxious. Her affect is blunt. Thought content is not paranoid. She exhibits a depressed mood. She expresses no homicidal and no suicidal ideation.  Seems generally down today Pleasant and attentive           Assessment & Plan:   Problem List Items Addressed This Visit     Other   Routine general medical examination at a health care facility - Primary     Reviewed health habits including diet and exercise and skin cancer prevention Reviewed appropriate screening tests for age  Also reviewed health mt list, fam hx and immunization status , as well as social and family history   See HPI Will get flu shot at work Will schedule her own mammogram -due next mo     Obesity     Discussed how this problem influences overall health and the risks it imposes  Reviewed  plan for weight loss with lower calorie diet (via better food choices and also portion control or program like weight watchers) and exercise building up to or more than 30 minutes 5 days per week including some aerobic activity   Given copy of DASH diet Made plan for exercise  Needs to work on balance with stressful job as well to prioritize her health    Elevated blood-pressure reading without diagnosis of hypertension     bp continues to go up and down with stress  Enc wt loss/ relaxation tech/exercise/ healthy eating  Given copy of DASH diet  BP: 130/88 mmHg  - better on 2nd check Pt will continue to follow at home and will update if her bp is 045 systolic or 90 diastolic routinely She may eventually develop essential HTN with fam hx as well    Stress reaction     Reviewed stressors/ coping techniques/symptoms/ support sources/ tx options and side effects in detail today  Will ref to counseling /psychology to work on [employer], television/film/video for work stressors that will not change Enc her to seek a new job also if necessary    Relevant Orders      Ambulatory referral to Psychology

## 2014-03-01 NOTE — Assessment & Plan Note (Signed)
Reviewed stressors/ coping techniques/symptoms/ support sources/ tx options and side effects in detail today  Will ref to counseling /psychology to work on [employer], television/film/video for work stressors that will not change Enc her to seek a new job also if necessary

## 2014-03-01 NOTE — Assessment & Plan Note (Signed)
Discussed how this problem influences overall health and the risks it imposes  Reviewed plan for weight loss with lower calorie diet (via better food choices and also portion control or program like weight watchers) and exercise building up to or more than 30 minutes 5 days per week including some aerobic activity   Given copy of DASH diet Made plan for exercise  Needs to work on balance with stressful job as well to prioritize her health

## 2014-03-01 NOTE — Assessment & Plan Note (Signed)
bp continues to go up and down with stress  Enc wt loss/ relaxation tech/exercise/ healthy eating  Given copy of DASH diet  BP: 130/88 mmHg  - better on 2nd check Pt will continue to follow at home and will update if her bp is 732 systolic or 90 diastolic routinely She may eventually develop essential HTN with fam hx as well

## 2014-03-01 NOTE — Assessment & Plan Note (Signed)
Reviewed health habits including diet and exercise and skin cancer prevention Reviewed appropriate screening tests for age  Also reviewed health mt list, fam hx and immunization status , as well as social and family history   See HPI Will get flu shot at work Will schedule her own mammogram -due next mo

## 2014-03-01 NOTE — Progress Notes (Signed)
Pre visit review using our clinic review tool, if applicable. No additional management support is needed unless otherwise documented below in the visit note. 

## 2014-03-13 ENCOUNTER — Other Ambulatory Visit: Payer: Self-pay

## 2014-03-13 DIAGNOSIS — Z1231 Encounter for screening mammogram for malignant neoplasm of breast: Secondary | ICD-10-CM

## 2014-03-20 ENCOUNTER — Ambulatory Visit (INDEPENDENT_AMBULATORY_CARE_PROVIDER_SITE_OTHER): Payer: 59 | Admitting: Psychology

## 2014-03-20 DIAGNOSIS — F4323 Adjustment disorder with mixed anxiety and depressed mood: Secondary | ICD-10-CM

## 2014-03-26 ENCOUNTER — Ambulatory Visit
Admission: RE | Admit: 2014-03-26 | Discharge: 2014-03-26 | Disposition: A | Discharge status: Home / Self Care | Payer: Managed Care, Other (non HMO) | Source: Ambulatory Visit

## 2014-03-26 DIAGNOSIS — Z1231 Encounter for screening mammogram for malignant neoplasm of breast: Secondary | ICD-10-CM

## 2014-03-28 ENCOUNTER — Ambulatory Visit: Payer: Commercial Indemnity

## 2014-03-28 ENCOUNTER — Ambulatory Visit (INDEPENDENT_AMBULATORY_CARE_PROVIDER_SITE_OTHER): Payer: Managed Care, Other (non HMO)

## 2014-03-28 DIAGNOSIS — Z23 Encounter for immunization: Secondary | ICD-10-CM

## 2014-04-04 ENCOUNTER — Ambulatory Visit (INDEPENDENT_AMBULATORY_CARE_PROVIDER_SITE_OTHER): Payer: Managed Care, Other (non HMO) | Admitting: Psychology

## 2014-04-04 DIAGNOSIS — F4323 Adjustment disorder with mixed anxiety and depressed mood: Secondary | ICD-10-CM

## 2014-04-09 ENCOUNTER — Ambulatory Visit: Payer: Managed Care, Other (non HMO) | Admitting: Psychology

## 2014-05-17 ENCOUNTER — Other Ambulatory Visit: Payer: Self-pay | Admitting: *Deleted

## 2014-05-21 ENCOUNTER — Ambulatory Visit: Payer: 59 | Admitting: Psychology

## 2014-05-29 ENCOUNTER — Telehealth: Payer: Self-pay | Admitting: *Deleted

## 2014-05-29 NOTE — Telephone Encounter (Signed)
Baker Pierini RN with [facility] [place] called stating pt has return of discolored production from congestion- " dark brown and yellow ".  Lenzi completed previous antibiotic with clearing of colored sputum.  Pt is without fevers or chills.  Baker Pierini is inquiring if MD feels additional antibiotic needed at this time.  Return call number for RN is [phone].

## 2014-10-25 ENCOUNTER — Other Ambulatory Visit: Payer: Self-pay

## 2014-10-25 DIAGNOSIS — J309 Allergic rhinitis, unspecified: Secondary | ICD-10-CM

## 2014-10-25 MED ORDER — FEXOFENADINE HCL 30 MG PO TABS: 30.0000 mg | ORAL_TABLET | Freq: Two times a day (BID) | ORAL | Status: DC

## 2014-10-28 ENCOUNTER — Ambulatory Visit (INDEPENDENT_AMBULATORY_CARE_PROVIDER_SITE_OTHER): Payer: BLUE CROSS/BLUE SHIELD | Admitting: Family Medicine

## 2014-10-28 ENCOUNTER — Encounter: Payer: Self-pay | Admitting: Family Medicine

## 2014-10-28 VITALS — BP 138/90 | HR 92 | Temp 98.3°F | Ht 67.0 in | Wt 251.5 lb

## 2014-10-28 DIAGNOSIS — I10 Essential (primary) hypertension: Secondary | ICD-10-CM | POA: Insufficient documentation

## 2014-10-28 DIAGNOSIS — N912 Amenorrhea, unspecified: Secondary | ICD-10-CM

## 2014-10-28 DIAGNOSIS — E282 Polycystic ovarian syndrome: Secondary | ICD-10-CM | POA: Diagnosis not present

## 2014-10-28 MED ORDER — SPIRONOLACTONE 50 MG PO TABS: 50.0000 mg | ORAL_TABLET | Freq: Every day | ORAL | Status: DC

## 2014-10-28 NOTE — Assessment & Plan Note (Signed)
Recurrent in pt with obesity and suspected PCOS Ref to gyn for further eval

## 2014-10-28 NOTE — Assessment & Plan Note (Signed)
With fam hx/ obesity and likely PCOS Start spironolactone 50 mg daily-disc side eff such as high K  F/u 2-3 wk visit and labs  Disc lifestyle change / diet/exercise

## 2014-10-28 NOTE — Assessment & Plan Note (Signed)
Strongly suspect- with central obesity/amenorrhea//hirsuit features  Will begin spironolactone to try to get bp down/ may help hair growth also  Ref to gyn for w/u

## 2014-10-28 NOTE — Patient Instructions (Signed)
Start spironolactone for blood pressure (this may also help hair growth) once daily in am  If any side effects please let me know  Follow up with me in 2-3 weeks Stop at check out for referral to gyn

## 2014-10-28 NOTE — Progress Notes (Signed)
Subjective:    Patient ID: Ashley Mercado, female    DOB: 08/12/1970, 44 y.o.   MRN: [id number]  HPI Here for bp check   Also curious if she may have PCOS  Does not see a gyn  No periods -since she did nuva ring - a "while ago" No early menopause in the family    Wt is up - having problems keeping weight off   BP is going up  BP Readings from Last 3 Encounters:  10/28/14 146/94  03/01/14 130/88  11/29/13 128/92    At home - bp vary - from 116-151 / high 80s to 90s  This seems to worsen headaches  Not drinking enough water - better when she drinks more   Is having hair growth on her chin and side of her face / occ a few on chest   Never had a work up before   Craves sugar - all the time  No hx of high sugar however   Does not think she has ovarian cysts -no particular pain  occ fleeting pain -no more than that   Patient Active Problem List   Diagnosis Date Noted  . Stress reaction 03/01/2014  . Amenorrhea 05/26/2012  . Other screening mammogram 02/18/2012  . Plantar fasciitis of left foot 02/18/2012  . Obesity 10/20/2011  . Elevated blood-pressure reading without diagnosis of hypertension 10/20/2011  . Routine gynecological examination 02/17/2011  . Routine general medical examination at a health care facility 02/06/2011  . Depression 11/04/2010  . HIDRADENITIS SUPPURATIVA 05/12/2010  . 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 THROMBOCYTOSIS 11/30/2006  . FATIGUE 11/30/2006  . KELOID SCAR 11/29/2006  . FOLLICULITIS 78/67/6720   Past Medical History  Diagnosis Date  . Menorrhagia   . Keloid scar   . Folliculitis   . HSV infection   . Hydradenitis    No past surgical history on file. History  Substance Use Topics  . Smoking status: Never Smoker   . Smokeless tobacco: Not on file  . Alcohol Use: Yes     Comment: rare   Family History  Problem Relation Age of Onset  . Hypertension Mother   . Diabetes Mother   . Hypertension Father   . Diabetes Father   . Cancer  Maternal Uncle     lymphoma  . Cancer Maternal Grandmother     ovarian   Allergies  Allergen Reactions  . Other     artificial sweeteners  . Sulfa Antibiotics Rash   Current Outpatient Prescriptions on File Prior to Visit  Medication Sig Dispense Refill  . fexofenadine (ALLEGRA) 30 MG tablet Take 1 tablet (30 mg total) by mouth 2 (two) times daily. 30 tablet 2  . fluticasone (FLONASE) 50 MCG/ACT nasal spray Place 2 sprays into both nostrils daily. 16 g 6  . ibuprofen (ADVIL,MOTRIN) 200 MG tablet Take 200 mg by mouth as directed.      . Multiple Vitamins-Minerals (MULTIVITAMIN GUMMIES ADULT PO) Take 2 each by mouth daily.     No current facility-administered medications on file prior to visit.         Review of Systems Review of Systems  Constitutional: Negative for fever, appetite change, and unexpected weight change.  Eyes: Negative for pain and visual disturbance.  Respiratory: Negative for cough and shortness of breath.   Cardiovascular: Negative for cp or palpitations    Gastrointestinal: Negative for nausea, diarrhea and constipation.  Genitourinary: Negative for urgency and frequency. pos for amenorrhea neg for hot flashes  Skin: Negative for pallor or rash  pos for facial hair growth  Neurological: Negative for weakness, light-headedness, numbness and headaches.  Hematological: Negative for adenopathy. Does not bruise/bleed easily.  Psychiatric/Behavioral: Negative for dysphoric mood. The patient is not nervous/anxious.        . Objective:   Physical Exam  Constitutional: She appears well-developed and well-nourished. No distress.  obese and well appearing   HENT:  Head: Normocephalic and atraumatic.  Mouth/Throat: Oropharynx is clear and moist.  Eyes: Conjunctivae and EOM are normal. Pupils are equal, round, and reactive to light.  Neck: Normal range of motion. Neck supple. No JVD present. Carotid bruit is not present. No thyromegaly present.  Cardiovascular:  Normal rate, regular rhythm, normal heart sounds and intact distal pulses.  Exam reveals no gallop.   Pulmonary/Chest: Effort normal and breath sounds normal. No respiratory distress. She has no wheezes. She has no rales.  No crackles  Abdominal: Soft. Bowel sounds are normal. She exhibits no distension, no abdominal bruit and no mass. There is no tenderness.  Musculoskeletal: She exhibits no edema.  Lymphadenopathy:    She has no cervical adenopathy.  Neurological: She is alert. She has normal reflexes.  Skin: Skin is warm and dry. No rash noted. No pallor.  Psychiatric: She has a normal mood and affect.          Assessment & Plan:   Problem List Items Addressed This Visit      Cardiovascular and Mediastinum   Essential hypertension    With fam hx/ obesity and likely PCOS Start spironolactone 50 mg daily-disc side eff such as high K  F/u 2-3 wk visit and labs  Disc lifestyle change / diet/exercise       Relevant Medications   spironolactone (ALDACTONE) 50 MG tablet     Endocrine   PCOS (polycystic ovarian syndrome)    Strongly suspect- with central obesity/amenorrhea//hirsuit features  Will begin spironolactone to try to get bp down/ may help hair growth also  Ref to gyn for w/u      Relevant Orders   Ambulatory referral to Gynecology     Other   Amenorrhea - Primary    Recurrent in pt with obesity and suspected PCOS Ref to gyn for further eval       Relevant Orders   Ambulatory referral to Gynecology

## 2014-10-28 NOTE — Progress Notes (Signed)
Pre visit review using our clinic review tool, if applicable. No additional management support is needed unless otherwise documented below in the visit note. 

## 2014-11-07 ENCOUNTER — Ambulatory Visit (INDEPENDENT_AMBULATORY_CARE_PROVIDER_SITE_OTHER): Payer: BLUE CROSS/BLUE SHIELD | Admitting: Obstetrics and Gynecology

## 2014-11-07 ENCOUNTER — Encounter: Payer: Self-pay | Admitting: Obstetrics and Gynecology

## 2014-11-07 VITALS — BP 126/91 | HR 101 | Ht 67.0 in | Wt 248.0 lb

## 2014-11-07 DIAGNOSIS — N912 Amenorrhea, unspecified: Secondary | ICD-10-CM

## 2014-11-07 NOTE — Progress Notes (Signed)
Patient ID: Ashley Mercado, female   DOB: 1970-09-23, 44 y.o.   MRN: [id number] 44 yo  [id number] here for the evaluation of a one-year history of amenorrhea. Patient reports irregular cycles for approximately 2-3 years prior to her 1 year of amenorrhea. She was on birth control during that time to help control her cycles. She she stopped OCPs her periods never returned. She also reports plucking a few facial hair on her chin and right cheek. She reports difficulty losing weight and expresses concern for PCOS. She denies any pelvic pain. In prior years, her cycles have always been regular. She denies any frequent headaches or visual changes. She denies nipple discharge  Past Medical History  Diagnosis Date  . Menorrhagia   . Keloid scar   . Folliculitis   . HSV infection   . Hydradenitis    History reviewed. No pertinent past surgical history. Family History  Problem Relation Age of Onset  . Hypertension Mother   . Diabetes Mother   . Hypertension Father   . Diabetes Father   . Cancer Maternal Uncle     lymphoma  . Cancer Maternal Grandmother     ovarian   History  Substance Use Topics  . Smoking status: Never Smoker   . Smokeless tobacco: Not on file  . Alcohol Use: Yes     Comment: rare   ROS See pertinent in HPI  Filed Vitals:   11/07/14 1021  BP: 126/91  Pulse: 101     GENERAL: Well-developed, well-nourished female in no acute distress.  ABDOMEN: Soft, nontender, nondistended. No organomegaly. Obese EXTREMITIES: No cyanosis, clubbing, or edema, 2+ distal pulses.  A/P 44 yo with 1 year of amenorrhea - Will check ovarian function with FSH and LH - Will also check TSH, prolactin and testosterone levels - expressed concerns for possible menopause rather than PCOS. Patient verbalized understanding and all questions were answered - Patient will be contacted with any abnormal results - Office pregnancy test is negative

## 2014-11-08 LAB — TSH: TSH: 1.141 u[IU]/mL (ref 0.350–4.500)

## 2014-11-08 LAB — LUTEINIZING HORMONE: LH: 50.9 m[IU]/mL

## 2014-11-08 LAB — TESTOSTERONE: Testosterone: 54 ng/dL (ref 10–70)

## 2014-11-08 LAB — PROLACTIN: Prolactin: 4.8 ng/mL

## 2014-11-08 LAB — FOLLICLE STIMULATING HORMONE: FSH: 89.9 m[IU]/mL

## 2014-11-11 ENCOUNTER — Telehealth: Payer: Self-pay | Admitting: *Deleted

## 2014-11-11 NOTE — Telephone Encounter (Signed)
-----   Message from Mora Bellman, MD sent at 11/08/2014  8:14 AM EDT ----- PLease inform patient that lab findings suggests that she is in menopause. Please advise her to return to the office if she develops any type of vaginal bleeding in the menopause state. Her thyroid test was normal  Thanks  Peggy

## 2014-11-11 NOTE — Telephone Encounter (Signed)
Pt aware.

## 2014-11-13 ENCOUNTER — Ambulatory Visit (INDEPENDENT_AMBULATORY_CARE_PROVIDER_SITE_OTHER): Payer: BLUE CROSS/BLUE SHIELD | Admitting: Family Medicine

## 2014-11-13 ENCOUNTER — Encounter: Payer: Self-pay | Admitting: Family Medicine

## 2014-11-13 VITALS — BP 120/80 | HR 96 | Temp 98.5°F | Ht 67.0 in | Wt 243.5 lb

## 2014-11-13 DIAGNOSIS — I10 Essential (primary) hypertension: Secondary | ICD-10-CM

## 2014-11-13 MED ORDER — SPIRONOLACTONE 50 MG PO TABS: 50.0000 mg | ORAL_TABLET | Freq: Every day | ORAL | Status: DC

## 2014-11-13 NOTE — Progress Notes (Signed)
Pre visit review using our clinic review tool, if applicable. No additional management support is needed unless otherwise documented below in the visit note. 

## 2014-11-13 NOTE — Patient Instructions (Signed)
Continue spironolactone  Blood pressure is improved Keep working on healthy habits and weight loss Labs today

## 2014-11-13 NOTE — Assessment & Plan Note (Signed)
In obese female with PCOS  Improved with spironolactone and tolerating well  Also loosing wt -enc to keep going Bmp today and update

## 2014-11-13 NOTE — Progress Notes (Signed)
Subjective:    Patient ID: Ashley Mercado, female    DOB: 04-10-1971, 44 y.o.   MRN: [id number]  HPI Here for f/u of HTN   Started spironolactone 50 mg last visit  One day she felt sluggish- bp was a bit lower than usual   BP Readings from Last 3 Encounters:  11/13/14 120/80  11/07/14 126/91  10/28/14 138/90    Wt is down 5 lb with bmi of 38  Working on her diet   Found out she was menopausal from her gyn   Patient Active Problem List   Diagnosis Date Noted  . Essential hypertension 10/28/2014  . PCOS (polycystic ovarian syndrome) 10/28/2014  . Stress reaction 03/01/2014  . Amenorrhea 05/26/2012  . Other screening mammogram 02/18/2012  . Plantar fasciitis of left foot 02/18/2012  . Obesity 10/20/2011  . Elevated blood-pressure reading without diagnosis of hypertension 10/20/2011  . Routine gynecological examination 02/17/2011  . Routine general medical examination at a health care facility 02/06/2011  . Depression 11/04/2010  . HIDRADENITIS SUPPURATIVA 05/12/2010  . 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 THROMBOCYTOSIS 11/30/2006  . FATIGUE 11/30/2006  . KELOID SCAR 11/29/2006  . FOLLICULITIS 41/66/0630   Past Medical History  Diagnosis Date  . Menorrhagia   . Keloid scar   . Folliculitis   . HSV infection   . Hydradenitis    No past surgical history on file. History  Substance Use Topics  . Smoking status: Never Smoker   . Smokeless tobacco: Never Used  . Alcohol Use: 0.0 oz/week    0 Standard drinks or equivalent per week     Comment: rare   Family History  Problem Relation Age of Onset  . Hypertension Mother   . Diabetes Mother   . Hypertension Father   . Diabetes Father   . Cancer Maternal Uncle     lymphoma  . Cancer Maternal Grandmother     ovarian   Allergies  Allergen Reactions  . Other     artificial sweeteners  . Sulfa Antibiotics Rash   Current Outpatient Prescriptions on File Prior to Visit  Medication Sig Dispense Refill  . fluticasone (FLONASE)  50 MCG/ACT nasal spray Place 2 sprays into both nostrils daily. 16 g 6  . ibuprofen (ADVIL,MOTRIN) 200 MG tablet Take 200 mg by mouth as directed.      . Multiple Vitamins-Minerals (MULTIVITAMIN GUMMIES ADULT PO) Take 2 each by mouth daily.    Marland Kitchen spironolactone (ALDACTONE) 50 MG tablet Take 1 tablet (50 mg total) by mouth daily. 30 tablet 3   No current facility-administered medications on file prior to visit.      Review of Systems Review of Systems  Constitutional: Negative for fever, appetite change, fatigue and unexpected weight change.  Eyes: Negative for pain and visual disturbance.  Respiratory: Negative for cough and shortness of breath.   Cardiovascular: Negative for cp or palpitations    Gastrointestinal: Negative for nausea, diarrhea and constipation.  Genitourinary: Negative for urgency and frequency.  Skin: Negative for pallor or rash   Neurological: Negative for weakness, light-headedness, numbness and headaches.  Hematological: Negative for adenopathy. Does not bruise/bleed easily.  Psychiatric/Behavioral: Negative for dysphoric mood. The patient is not nervous/anxious.         Objective:   Physical Exam  Constitutional: She appears well-developed and well-nourished. No distress.  obese and well appearing   HENT:  Head: Normocephalic and atraumatic.  Mouth/Throat: Oropharynx is clear and moist.  Eyes: Conjunctivae and EOM are normal. Pupils are  equal, round, and reactive to light.  Neck: Normal range of motion. Neck supple. No JVD present. Carotid bruit is not present. No thyromegaly present.  Cardiovascular: Normal rate, regular rhythm, normal heart sounds and intact distal pulses.  Exam reveals no gallop.   Pulmonary/Chest: Effort normal and breath sounds normal. No respiratory distress. She has no wheezes. She has no rales.  No crackles  Abdominal: Soft. Bowel sounds are normal. She exhibits no distension, no abdominal bruit and no mass. There is no tenderness.    Musculoskeletal: She exhibits no edema.  Lymphadenopathy:    She has no cervical adenopathy.  Neurological: She is alert. She has normal reflexes.  Skin: Skin is warm and dry. No rash noted.  Psychiatric: She has a normal mood and affect.          Assessment & Plan:   Problem List Items Addressed This Visit    Essential hypertension - Primary    In obese female with PCOS  Improved with spironolactone and tolerating well  Also loosing wt -enc to keep going Bmp today and update        Relevant Medications   spironolactone (ALDACTONE) 50 MG tablet   Other Relevant Orders   Basic metabolic panel (Completed)

## 2014-11-14 LAB — BASIC METABOLIC PANEL
BUN: 19 mg/dL (ref 6–23)
CALCIUM: 10 mg/dL (ref 8.4–10.5)
CO2: 31 meq/L (ref 19–32)
Chloride: 100 mEq/L (ref 96–112)
Creatinine, Ser: 0.97 mg/dL (ref 0.40–1.20)
GFR: 79.5 mL/min (ref >60.00)
GLUCOSE: 87 mg/dL (ref 70–99)
Potassium: 4.6 mEq/L (ref 3.5–5.1)
SODIUM: 137 meq/L (ref 135–145)

## 2015-03-02 ENCOUNTER — Telehealth: Payer: Self-pay | Admitting: Family Medicine

## 2015-03-02 DIAGNOSIS — Z0000 Encounter for general adult medical examination without abnormal findings: Secondary | ICD-10-CM

## 2015-03-02 NOTE — Telephone Encounter (Signed)
-----   Message from Marchia Bond sent at 02/26/2015  1:54 PM EDT ----- Regarding: Cpx labs 8/29, need orderd please :-) Please order  future cpx labs for pt's upcoming lab appt. Thanks Aniceto Boss

## 2015-03-03 ENCOUNTER — Other Ambulatory Visit (INDEPENDENT_AMBULATORY_CARE_PROVIDER_SITE_OTHER): Payer: BLUE CROSS/BLUE SHIELD

## 2015-03-03 DIAGNOSIS — Z0000 Encounter for general adult medical examination without abnormal findings: Secondary | ICD-10-CM

## 2015-03-03 LAB — COMPREHENSIVE METABOLIC PANEL
ALBUMIN: 3.8 g/dL (ref 3.5–5.2)
ALK PHOS: 89 U/L (ref 39–117)
ALT: 14 U/L (ref 0–35)
AST: 16 U/L (ref 0–37)
BILIRUBIN TOTAL: 0.4 mg/dL (ref 0.2–1.2)
BUN: 14 mg/dL (ref 6–23)
CALCIUM: 9.2 mg/dL (ref 8.4–10.5)
CHLORIDE: 103 meq/L (ref 96–112)
CO2: 28 mEq/L (ref 19–32)
Creatinine, Ser: 0.85 mg/dL (ref 0.40–1.20)
GFR: 92.46 mL/min (ref >60.00)
Glucose, Bld: 85 mg/dL (ref 70–99)
Potassium: 4 mEq/L (ref 3.5–5.1)
SODIUM: 140 meq/L (ref 135–145)
TOTAL PROTEIN: 7.7 g/dL (ref 6.0–8.3)

## 2015-03-03 LAB — LIPID PANEL
Cholesterol: 168 mg/dL (ref 0–200)
HDL: 49.9 mg/dL (ref >39.00)
LDL Cholesterol: 106 mg/dL — ABNORMAL HIGH (ref 0–99)
NonHDL: 117.76
TRIGLYCERIDES: 59 mg/dL (ref 0.0–149.0)
Total CHOL/HDL Ratio: 3
VLDL: 11.8 mg/dL (ref 0.0–40.0)

## 2015-03-03 LAB — CBC WITH DIFFERENTIAL/PLATELET
Basophils Absolute: 0 10*3/uL (ref 0.0–0.1)
Basophils Relative: 0.2 % (ref 0.0–3.0)
EOS ABS: 0.1 10*3/uL (ref 0.0–0.7)
Eosinophils Relative: 1 % (ref 0.0–5.0)
HEMATOCRIT: 42.9 % (ref 36.0–46.0)
HEMOGLOBIN: 14.5 g/dL (ref 12.0–15.0)
LYMPHS PCT: 27.6 % (ref 12.0–46.0)
Lymphs Abs: 1.5 10*3/uL (ref 0.7–4.0)
MCHC: 33.7 g/dL (ref 30.0–36.0)
MCV: 88 fl (ref 78.0–100.0)
Monocytes Absolute: 0.4 10*3/uL (ref 0.1–1.0)
Monocytes Relative: 6.3 % (ref 3.0–12.0)
Neutro Abs: 3.6 10*3/uL (ref 1.4–7.7)
Neutrophils Relative %: 64.9 % (ref 43.0–77.0)
Platelets: 390 10*3/uL (ref 150.0–400.0)
RBC: 4.88 Mil/uL (ref 3.87–5.11)
RDW: 14.2 % (ref 11.5–15.5)
WBC: 5.6 10*3/uL (ref 4.0–10.5)

## 2015-03-03 LAB — TSH: TSH: 1.91 u[IU]/mL (ref 0.35–4.50)

## 2015-03-07 ENCOUNTER — Encounter: Payer: Self-pay | Admitting: Family Medicine

## 2015-03-07 ENCOUNTER — Ambulatory Visit (INDEPENDENT_AMBULATORY_CARE_PROVIDER_SITE_OTHER): Payer: BLUE CROSS/BLUE SHIELD | Admitting: Family Medicine

## 2015-03-07 VITALS — BP 110/68 | HR 96 | Temp 98.1°F | Ht 67.0 in | Wt 241.0 lb

## 2015-03-07 DIAGNOSIS — E669 Obesity, unspecified: Secondary | ICD-10-CM

## 2015-03-07 DIAGNOSIS — I10 Essential (primary) hypertension: Secondary | ICD-10-CM | POA: Diagnosis not present

## 2015-03-07 DIAGNOSIS — Z23 Encounter for immunization: Secondary | ICD-10-CM

## 2015-03-07 DIAGNOSIS — Z114 Encounter for screening for human immunodeficiency virus [HIV]: Secondary | ICD-10-CM | POA: Insufficient documentation

## 2015-03-07 DIAGNOSIS — J302 Other seasonal allergic rhinitis: Secondary | ICD-10-CM | POA: Diagnosis not present

## 2015-03-07 DIAGNOSIS — Z0000 Encounter for general adult medical examination without abnormal findings: Secondary | ICD-10-CM

## 2015-03-07 MED ORDER — FLUTICASONE PROPIONATE 50 MCG/ACT NA SUSP: 2.0000 | Freq: Every day | NASAL | Status: DC

## 2015-03-07 NOTE — Patient Instructions (Signed)
Take care of yourself  HIV screen today  Flu shot today  Labs look ok  Eat a low fat and low sugar diet

## 2015-03-07 NOTE — Assessment & Plan Note (Signed)
bp in fair control at this time  BP Readings from Last 1 Encounters:  03/07/15 110/68   No changes needed Disc lifstyle change with low sodium diet and exercise  Labs reviewed  On spironolactone for this and PCOS

## 2015-03-07 NOTE — Assessment & Plan Note (Signed)
Reviewed health habits including diet and exercise and skin cancer prevention Reviewed appropriate screening tests for age  Also reviewed health mt list, fam hx and immunization status , as well as social and family history   See HPI Labs rev  Flu shot today  Screen for HIV today  Pt will schedule her own mammogram

## 2015-03-07 NOTE — Progress Notes (Signed)
Subjective:    Patient ID: Ashley Mercado, female    DOB: 1971/05/12, 44 y.o.   MRN: [id number]  HPI Here for health maintenance exam and to review chronic medical problems    Taking care of mother who had back surgery  She is feeling ok   Wt is down 2 lb with bmi of 37 She says she goes up and down  Eating habits are irratic - emotional eating   Walks for exercise   Less stress at work- that is better    HIV screening - is interested in that   Flu shot -wants one today    Mammogram 9/15 neg - is about due  Self exam -no lumps   Pap nl 8/14  No abn paps in her hx  No new partners  Neg for HPV  Menses- last period was more than a year ago - was checked and in menopause  Some hot flashes at night    Td 6/08    bp is stable today  No cp or palpitations or headaches or edema  No side effects to medicines  BP Readings from Last 3 Encounters:  03/07/15 110/68  11/13/14 120/80  11/07/14 126/91    Does great with the spironolactone   Results for orders placed or performed in visit on 03/03/15  CBC with Differential/Platelet  Result Value Ref Range   WBC 5.6 4.0 - 10.5 K/uL   RBC 4.88 3.87 - 5.11 Mil/uL   Hemoglobin 14.5 12.0 - 15.0 g/dL   HCT 42.9 36.0 - 46.0 %   MCV 88.0 78.0 - 100.0 fl   MCHC 33.7 30.0 - 36.0 g/dL   RDW 14.2 11.5 - 15.5 %   Platelets 390.0 150.0 - 400.0 K/uL   Neutrophils Relative % 64.9 43.0 - 77.0 %   Lymphocytes Relative 27.6 12.0 - 46.0 %   Monocytes Relative 6.3 3.0 - 12.0 %   Eosinophils Relative 1.0 0.0 - 5.0 %   Basophils Relative 0.2 0.0 - 3.0 %   Neutro Abs 3.6 1.4 - 7.7 K/uL   Lymphs Abs 1.5 0.7 - 4.0 K/uL   Monocytes Absolute 0.4 0.1 - 1.0 K/uL   Eosinophils Absolute 0.1 0.0 - 0.7 K/uL   Basophils Absolute 0.0 0.0 - 0.1 K/uL  Comprehensive metabolic panel  Result Value Ref Range   Sodium 140 135 - 145 mEq/L   Potassium 4.0 3.5 - 5.1 mEq/L   Chloride 103 96 - 112 mEq/L   CO2 28 19 - 32 mEq/L   Glucose, Bld 85 70 - 99  mg/dL   BUN 14 6 - 23 mg/dL   Creatinine, Ser 0.85 0.40 - 1.20 mg/dL   Total Bilirubin 0.4 0.2 - 1.2 mg/dL   Alkaline Phosphatase 89 39 - 117 U/L   AST 16 0 - 37 U/L   ALT 14 0 - 35 U/L   Total Protein 7.7 6.0 - 8.3 g/dL   Albumin 3.8 3.5 - 5.2 g/dL   Calcium 9.2 8.4 - 10.5 mg/dL   GFR 92.46 >60.00 mL/min  Lipid panel  Result Value Ref Range   Cholesterol 168 0 - 200 mg/dL   Triglycerides 59.0 0.0 - 149.0 mg/dL   HDL 49.90 >39.00 mg/dL   VLDL 11.8 0.0 - 40.0 mg/dL   LDL Cholesterol 106 (H) 0 - 99 mg/dL   Total CHOL/HDL Ratio 3    NonHDL 117.76   TSH  Result Value Ref Range   TSH 1.91 0.35 - 4.50 uIU/mL  Patient Active Problem List   Diagnosis Date Noted  . Essential hypertension 10/28/2014  . PCOS (polycystic ovarian syndrome) 10/28/2014  . Stress reaction 03/01/2014  . Amenorrhea 05/26/2012  . Other screening mammogram 02/18/2012  . Plantar fasciitis of left foot 02/18/2012  . Obesity 10/20/2011  . Elevated blood-pressure reading without diagnosis of hypertension 10/20/2011  . Routine gynecological examination 02/17/2011  . Routine general medical examination at a health care facility 02/06/2011  . Depression 11/04/2010  . HIDRADENITIS SUPPURATIVA 05/12/2010  . 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 THROMBOCYTOSIS 11/30/2006  . FATIGUE 11/30/2006  . KELOID SCAR 11/29/2006  . FOLLICULITIS 34/74/2595   Past Medical History  Diagnosis Date  . Menorrhagia   . Keloid scar   . Folliculitis   . HSV infection   . Hydradenitis    No past surgical history on file. Social History  Substance Use Topics  . Smoking status: Never Smoker   . Smokeless tobacco: Never Used  . Alcohol Use: 0.0 oz/week    0 Standard drinks or equivalent per week     Comment: rare   Family History  Problem Relation Age of Onset  . Hypertension Mother   . Diabetes Mother   . Hypertension Father   . Diabetes Father   . Cancer Maternal Uncle     lymphoma  . Cancer Maternal Grandmother     ovarian    Allergies  Allergen Reactions  . Other     artificial sweeteners  . Sulfa Antibiotics Rash   Current Outpatient Prescriptions on File Prior to Visit  Medication Sig Dispense Refill  . fluticasone (FLONASE) 50 MCG/ACT nasal spray Place 2 sprays into both nostrils daily. 16 g 6  . ibuprofen (ADVIL,MOTRIN) 200 MG tablet Take 200 mg by mouth as directed.      . Multiple Vitamins-Minerals (MULTIVITAMIN GUMMIES ADULT PO) Take 2 each by mouth daily.    Marland Kitchen spironolactone (ALDACTONE) 50 MG tablet Take 1 tablet (50 mg total) by mouth daily. 30 tablet 11   No current facility-administered medications on file prior to visit.    Review of Systems Review of Systems  Constitutional: Negative for fever, appetite change, fatigue and unexpected weight change.  Eyes: Negative for pain and visual disturbance.  Respiratory: Negative for cough and shortness of breath.   Cardiovascular: Negative for cp or palpitations    Gastrointestinal: Negative for nausea, diarrhea and constipation.  Genitourinary: Negative for urgency and frequency.  Skin: Negative for pallor or rash   Neurological: Negative for weakness, light-headedness, numbness and headaches.  Hematological: Negative for adenopathy. Does not bruise/bleed easily.  Psychiatric/Behavioral: Negative for dysphoric mood. The patient is not nervous/anxious.         Objective:   Physical Exam  Constitutional: She appears well-developed and well-nourished. No distress.  obese and well appearing   HENT:  Head: Normocephalic and atraumatic.  Right Ear: External ear normal.  Left Ear: External ear normal.  Mouth/Throat: Oropharynx is clear and moist.  Eyes: Conjunctivae and EOM are normal. Pupils are equal, round, and reactive to light. No scleral icterus.  Neck: Normal range of motion. Neck supple. No JVD present. Carotid bruit is not present. No thyromegaly present.  Cardiovascular: Normal rate, regular rhythm, normal heart sounds and intact  distal pulses.  Exam reveals no gallop.   Pulmonary/Chest: Effort normal and breath sounds normal. No respiratory distress. She has no wheezes. She exhibits no tenderness.  Abdominal: Soft. Bowel sounds are normal. She exhibits no distension, no abdominal bruit and no mass.  There is no tenderness.  Genitourinary: No breast swelling, tenderness, discharge or bleeding.  Breast exam: No mass, nodules, thickening, tenderness, bulging, retraction, inflamation, nipple discharge or skin changes noted.  No axillary or clavicular LA.      Musculoskeletal: Normal range of motion. She exhibits no edema or tenderness.  Lymphadenopathy:    She has no cervical adenopathy.  Neurological: She is alert. She has normal reflexes. No cranial nerve deficit. She exhibits normal muscle tone. Coordination normal.  Skin: Skin is warm and dry. No rash noted. No erythema. No pallor.  Psychiatric: She has a normal mood and affect.          Assessment & Plan:   Problem List Items Addressed This Visit      Cardiovascular and Mediastinum   Essential hypertension    bp in fair control at this time  BP Readings from Last 1 Encounters:  03/07/15 110/68   No changes needed Disc lifstyle change with low sodium diet and exercise  Labs reviewed  On spironolactone for this and PCOS        Other   Obesity    Discussed how this problem influences overall health and the risks it imposes  Reviewed plan for weight loss with lower calorie diet (via better food choices and also portion control or program like weight watchers) and exercise building up to or more than 30 minutes 5 days per week including some aerobic activity         Routine general medical examination at a health care facility - Primary    Reviewed health habits including diet and exercise and skin cancer prevention Reviewed appropriate screening tests for age  Also reviewed health mt list, fam hx and immunization status , as well as social and family  history   See HPI Labs rev  Flu shot today  Screen for HIV today  Pt will schedule her own mammogram       Screening for HIV (human immunodeficiency virus)    Lab for screening  Low risk       Relevant Orders   HIV antibody (with reflex) (Completed)    Other Visit Diagnoses    Other seasonal allergic rhinitis        Relevant Medications    fluticasone (FLONASE) 50 MCG/ACT nasal spray    Need for prophylactic vaccination and inoculation against influenza        Relevant Orders    Flu Vaccine QUAD 36+ mos IM

## 2015-03-07 NOTE — Assessment & Plan Note (Signed)
Discussed how this problem influences overall health and the risks it imposes  Reviewed plan for weight loss with lower calorie diet (via better food choices and also portion control or program like weight watchers) and exercise building up to or more than 30 minutes 5 days per week including some aerobic activity    

## 2015-03-07 NOTE — Assessment & Plan Note (Signed)
Lab for screening  Low risk

## 2015-03-08 LAB — HIV ANTIBODY (ROUTINE TESTING W REFLEX): HIV 1&2 Ab, 4th Generation: NONREACTIVE

## 2015-03-11 DIAGNOSIS — Z23 Encounter for immunization: Secondary | ICD-10-CM | POA: Diagnosis not present

## 2015-03-28 ENCOUNTER — Other Ambulatory Visit: Payer: Self-pay

## 2015-03-28 ENCOUNTER — Ambulatory Visit
Admission: RE | Admit: 2015-03-28 | Discharge: 2015-03-28 | Disposition: A | Discharge status: Home / Self Care | Payer: BLUE CROSS/BLUE SHIELD | Source: Ambulatory Visit

## 2015-03-28 DIAGNOSIS — Z1231 Encounter for screening mammogram for malignant neoplasm of breast: Secondary | ICD-10-CM

## 2015-09-27 ENCOUNTER — Other Ambulatory Visit: Payer: Self-pay | Admitting: Family Medicine

## 2015-12-11 ENCOUNTER — Other Ambulatory Visit: Payer: Self-pay | Admitting: Family Medicine

## 2016-01-12 ENCOUNTER — Ambulatory Visit (INDEPENDENT_AMBULATORY_CARE_PROVIDER_SITE_OTHER): Payer: BLUE CROSS/BLUE SHIELD | Admitting: Family Medicine

## 2016-01-12 ENCOUNTER — Encounter: Payer: Self-pay | Admitting: Family Medicine

## 2016-01-12 ENCOUNTER — Ambulatory Visit (INDEPENDENT_AMBULATORY_CARE_PROVIDER_SITE_OTHER)
Admission: RE | Admit: 2016-01-12 | Discharge: 2016-01-12 | Disposition: A | Discharge status: Home / Self Care | Payer: BLUE CROSS/BLUE SHIELD | Source: Ambulatory Visit | Attending: Family Medicine | Admitting: Family Medicine

## 2016-01-12 VITALS — BP 106/64 | HR 97 | Temp 98.5°F | Ht 67.0 in | Wt 251.2 lb

## 2016-01-12 DIAGNOSIS — M5442 Lumbago with sciatica, left side: Secondary | ICD-10-CM | POA: Diagnosis not present

## 2016-01-12 DIAGNOSIS — M25552 Pain in left hip: Secondary | ICD-10-CM | POA: Insufficient documentation

## 2016-01-12 DIAGNOSIS — M545 Low back pain, unspecified: Secondary | ICD-10-CM | POA: Insufficient documentation

## 2016-01-12 MED ORDER — MELOXICAM 15 MG PO TABS: 15.0000 mg | ORAL_TABLET | Freq: Every day | ORAL | Status: DC | PRN

## 2016-01-12 NOTE — Assessment & Plan Note (Signed)
Suspect hip bursitis but in light of occ groin pain/ want to r/o OA or other problem as well Dg hip today  Ice prn See AVS for inst  meloxicam 15 mg with food daily  Plan after result

## 2016-01-12 NOTE — Progress Notes (Signed)
Subjective:    Patient ID: Ashley Mercado, female    DOB: 20-Feb-1971, 45 y.o.   MRN: [id number]  HPI Here for pain in L flank area- coming and going for 2 months   Pain over L lateral abdomen and over outside of the hip Exacerbated by lying on her hip  Got a massage and that area hurt   Pressing on it hurts  Sitting prolonged - hurts to get up and walk and then it warms up   No rash   No GI or urinary symptom  No blood in urine    Buttock and lower back hurt some on that side  Also pain in groin area   No trauma or falls No major change in activity   Wt is up 10 lb from the fall  bmi of 39  2 advil helps some when she needs it   Patient Active Problem List   Diagnosis Date Noted  . Left hip pain 01/12/2016  . Low back pain 01/12/2016  . Screening for HIV (human immunodeficiency virus) 03/07/2015  . Essential hypertension 10/28/2014  . PCOS (polycystic ovarian syndrome) 10/28/2014  . Stress reaction 03/01/2014  . Amenorrhea 05/26/2012  . Other screening mammogram 02/18/2012  . Plantar fasciitis of left foot 02/18/2012  . Obesity 10/20/2011  . Elevated blood-pressure reading without diagnosis of hypertension 10/20/2011  . Routine gynecological examination 02/17/2011  . Routine general medical examination at a health care facility 02/06/2011  . Depression 11/04/2010  . HIDRADENITIS SUPPURATIVA 05/12/2010  . 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 THROMBOCYTOSIS 11/30/2006  . FATIGUE 11/30/2006  . KELOID SCAR 11/29/2006  . FOLLICULITIS [id number]   Past Medical History  Diagnosis Date  . Menorrhagia   . Keloid scar   . Folliculitis   . HSV infection   . Hydradenitis    No past surgical history on file. Social History  Substance Use Topics  . Smoking status: Never Smoker   . Smokeless tobacco: Never Used  . Alcohol Use: 0.0 oz/week    0 Standard drinks or equivalent per week     Comment: rare   Family History  Problem Relation Age of Onset  . Hypertension Mother   .  Diabetes Mother   . Hypertension Father   . Diabetes Father   . Cancer Maternal Uncle     lymphoma  . Cancer Maternal Grandmother     ovarian   Allergies  Allergen Reactions  . Other     artificial sweeteners  . Sulfa Antibiotics Rash   Current Outpatient Prescriptions on File Prior to Visit  Medication Sig Dispense Refill  . fexofenadine (ALLEGRA) 60 MG tablet TAKE 1/2 TABLET (30 MG TOTAL) BY MOUTH 2 (TWO) TIMES DAILY. 30 tablet 5  . fluticasone (FLONASE) 50 MCG/ACT nasal spray Place 2 sprays into both nostrils daily. 16 g 11  . Multiple Vitamins-Minerals (MULTIVITAMIN GUMMIES ADULT PO) Take 2 each by mouth daily.    Marland Kitchen spironolactone (ALDACTONE) 50 MG tablet TAKE 1 TABLET (50 MG TOTAL) BY MOUTH DAILY. 30 tablet 3   No current facility-administered medications on file prior to visit.    Review of Systems Review of Systems  Constitutional: Negative for fever, appetite change, fatigue and unexpected weight change.  Eyes: Negative for pain and visual disturbance.  Respiratory: Negative for cough and shortness of breath.   Cardiovascular: Negative for cp or palpitations    Gastrointestinal: Negative for nausea, diarrhea and constipation. pos for abd pain L sided-? Ref from back Genitourinary: Negative for  urgency and frequency.  Skin: Negative for pallor or rash   Neurological: Negative for weakness, light-headedness, numbness and headaches.  MSK pos for low back and buttock pain rad to leg and abd/ pos for L hip pain Hematological: Negative for adenopathy. Does not bruise/bleed easily.  Psychiatric/Behavioral: Negative for dysphoric mood. The patient is not nervous/anxious.         Objective:   Physical Exam  Constitutional: She appears well-developed and well-nourished. No distress.  obese and well appearing   HENT:  Head: Normocephalic and atraumatic.  Eyes: Conjunctivae and EOM are normal. Pupils are equal, round, and reactive to light. No scleral icterus.  Neck: Normal  range of motion. Neck supple.  Cardiovascular: Normal rate and regular rhythm.   Pulmonary/Chest: Effort normal and breath sounds normal. She has no wheezes. She has no rales.  Abdominal: Soft. Bowel sounds are normal. She exhibits no distension and no mass. There is no tenderness. There is no rebound and no guarding.  No abdominal tenderness  Mild tenderness of L lower ribs  Musculoskeletal: She exhibits tenderness.       Right shoulder: She exhibits tenderness and bony tenderness. She exhibits normal range of motion, no swelling, no effusion, no crepitus, no deformity, normal pulse and normal strength.       Lumbar back: She exhibits decreased range of motion, tenderness and spasm. She exhibits no bony tenderness and no edema.  L hip- tender over greater trochanter Also tender over L piriformis area (no pain on piriformis stretch) Nl rom of legs/ hips  Some inc pain on int rot of L hip  Neg SLR No neuro or gait changes   Lymphadenopathy:    She has no cervical adenopathy.  Neurological: She is alert. She has normal strength and normal reflexes. She displays no atrophy. No cranial nerve deficit or sensory deficit. She exhibits normal muscle tone. Coordination normal.  Negative SLR  Skin: Skin is warm and dry. No rash noted. No erythema. No pallor.  Psychiatric: She has a normal mood and affect.          Assessment & Plan:   Problem List Items Addressed This Visit      Other   Low back pain - Primary    Some radiation to L hip and buttock with tenderness in piriformis area and also some rad to L abdomen  Happens with position and activity No GI or urol symptoms xr today meloxicam prn  If nl report- consider further eval       Relevant Medications   meloxicam (MOBIC) 15 MG tablet   Other Relevant Orders   DG Lumbar Spine Complete   Left hip pain    Suspect hip bursitis but in light of occ groin pain/ want to r/o OA or other problem as well Dg hip today  Ice prn See  AVS for inst  meloxicam 15 mg with food daily  Plan after result      Relevant Orders   DG HIP UNILAT W OR W/O PELVIS 2-3 VIEWS LEFT

## 2016-01-12 NOTE — Progress Notes (Signed)
Pre visit review using our clinic review tool, if applicable. No additional management support is needed unless otherwise documented below in the visit note. 

## 2016-01-12 NOTE — Assessment & Plan Note (Signed)
Some radiation to L hip and buttock with tenderness in piriformis area and also some rad to L abdomen  Happens with position and activity No GI or urol symptoms xr today meloxicam prn  If nl report- consider further eval

## 2016-01-12 NOTE — Patient Instructions (Signed)
xrays of hip and low back today- I will get results tomorrow  Try mobic 15 mg with food once daily with food Put ice or other cold compress on outside of the hip where it hurts - 10 minutes whenever you can and do not lie on that side

## 2016-01-13 ENCOUNTER — Other Ambulatory Visit (INDEPENDENT_AMBULATORY_CARE_PROVIDER_SITE_OTHER): Payer: BLUE CROSS/BLUE SHIELD

## 2016-01-13 ENCOUNTER — Telehealth: Payer: Self-pay | Admitting: Family Medicine

## 2016-01-13 DIAGNOSIS — R109 Unspecified abdominal pain: Secondary | ICD-10-CM | POA: Insufficient documentation

## 2016-01-13 LAB — POC URINALSYSI DIPSTICK (AUTOMATED)
Bilirubin, UA: NEGATIVE
Glucose, UA: NEGATIVE
Ketones, UA: NEGATIVE
Leukocytes, UA: NEGATIVE
Nitrite, UA: NEGATIVE
PH UA: 6
PROTEIN UA: NEGATIVE
RBC UA: NEGATIVE
SPEC GRAV UA: 1.015
UROBILINOGEN UA: 0.2

## 2016-01-13 NOTE — Telephone Encounter (Signed)
Per Dr. Glori Bickers BMP not needed only CBP, order canceled

## 2016-01-13 NOTE — Addendum Note (Signed)
Addended by: Tammi Sou on: 01/13/2016 04:57 PM   Modules accepted: Orders

## 2016-01-13 NOTE — Telephone Encounter (Signed)
I resulted pt's xrays on mychart- all neg and no source found for pain  I would like to get a CT of abd and pelvis with and without contrast next for flank pain She will likely need a BUN and CR for this as well as cbc  Also a ua since she could not give a sample yesterday  Orders done Thanks Unsure where she will want to go Will cc this to [facility]

## 2016-01-13 NOTE — Addendum Note (Signed)
Addended by: Tammi Sou on: 01/13/2016 10:46 AM   Modules accepted: Orders

## 2016-01-13 NOTE — Telephone Encounter (Signed)
Pt notified of xray results and Dr. Marliss Coots comments. Lab appt/UA scheduled for today and Rosaria Ferries will get CT scheduled today when pt comes in for labs

## 2016-01-14 LAB — CBC WITH DIFFERENTIAL/PLATELET
BASOS PCT: 0.5 % (ref 0.0–3.0)
Basophils Absolute: 0 10*3/uL (ref 0.0–0.1)
Eosinophils Absolute: 0.1 10*3/uL (ref 0.0–0.7)
Eosinophils Relative: 1.1 % (ref 0.0–5.0)
HEMATOCRIT: 41.5 % (ref 36.0–46.0)
Hemoglobin: 14.1 g/dL (ref 12.0–15.0)
Lymphocytes Relative: 25.8 % (ref 12.0–46.0)
Lymphs Abs: 1.8 10*3/uL (ref 0.7–4.0)
MCHC: 33.9 g/dL (ref 30.0–36.0)
MCV: 86.4 fl (ref 78.0–100.0)
MONOS PCT: 7.6 % (ref 3.0–12.0)
Monocytes Absolute: 0.5 10*3/uL (ref 0.1–1.0)
NEUTROS ABS: 4.5 10*3/uL (ref 1.4–7.7)
Neutrophils Relative %: 65 % (ref 43.0–77.0)
PLATELETS: 410 10*3/uL — AB (ref 150.0–400.0)
RBC: 4.81 Mil/uL (ref 3.87–5.11)
RDW: 13.9 % (ref 11.5–15.5)
WBC: 6.9 10*3/uL (ref 4.0–10.5)

## 2016-01-16 ENCOUNTER — Ambulatory Visit (INDEPENDENT_AMBULATORY_CARE_PROVIDER_SITE_OTHER)
Admission: RE | Admit: 2016-01-16 | Discharge: 2016-01-16 | Disposition: A | Discharge status: Home / Self Care | Payer: BLUE CROSS/BLUE SHIELD | Source: Ambulatory Visit | Attending: Family Medicine | Admitting: Family Medicine

## 2016-01-16 DIAGNOSIS — R109 Unspecified abdominal pain: Secondary | ICD-10-CM | POA: Diagnosis not present

## 2016-01-16 HISTORY — DX: Essential (primary) hypertension: I10

## 2016-01-16 MED ORDER — IOPAMIDOL (ISOVUE-300) INJECTION 61%
100.0000 mL | Freq: Once | INTRAVENOUS | Status: AC | PRN
  Administered 2016-01-16: 100 mL via INTRAVENOUS

## 2016-02-28 ENCOUNTER — Telehealth: Payer: Self-pay | Admitting: Family Medicine

## 2016-02-28 DIAGNOSIS — E282 Polycystic ovarian syndrome: Secondary | ICD-10-CM

## 2016-02-28 DIAGNOSIS — Z0000 Encounter for general adult medical examination without abnormal findings: Secondary | ICD-10-CM

## 2016-02-28 NOTE — Telephone Encounter (Signed)
-----   Message from Marchia Bond sent at 02/26/2016  1:34 PM EDT ----- Regarding: Cpx labs Fri 9/1, need orders. Thanks! :-) Please order  future cpx labs for pt's upcoming lab appt. Thanks Aniceto Boss

## 2016-03-05 ENCOUNTER — Other Ambulatory Visit (INDEPENDENT_AMBULATORY_CARE_PROVIDER_SITE_OTHER): Payer: BLUE CROSS/BLUE SHIELD

## 2016-03-05 DIAGNOSIS — Z0000 Encounter for general adult medical examination without abnormal findings: Secondary | ICD-10-CM | POA: Diagnosis not present

## 2016-03-05 LAB — COMPREHENSIVE METABOLIC PANEL
ALBUMIN: 4.2 g/dL (ref 3.5–5.2)
ALK PHOS: 86 U/L (ref 39–117)
ALT: 20 U/L (ref 0–35)
AST: 20 U/L (ref 0–37)
BUN: 9 mg/dL (ref 6–23)
CALCIUM: 9.4 mg/dL (ref 8.4–10.5)
CHLORIDE: 102 meq/L (ref 96–112)
CO2: 32 mEq/L (ref 19–32)
Creatinine, Ser: 0.84 mg/dL (ref 0.40–1.20)
GFR: 93.32 mL/min (ref >60.00)
Glucose, Bld: 96 mg/dL (ref 70–99)
POTASSIUM: 4 meq/L (ref 3.5–5.1)
Sodium: 141 mEq/L (ref 135–145)
TOTAL PROTEIN: 8 g/dL (ref 6.0–8.3)
Total Bilirubin: 0.7 mg/dL (ref 0.2–1.2)

## 2016-03-05 LAB — LIPID PANEL
CHOLESTEROL: 182 mg/dL (ref 0–200)
HDL: 52.7 mg/dL (ref >39.00)
LDL CALC: 116 mg/dL — AB (ref 0–99)
NonHDL: 129.28
TRIGLYCERIDES: 64 mg/dL (ref 0.0–149.0)
Total CHOL/HDL Ratio: 3
VLDL: 12.8 mg/dL (ref 0.0–40.0)

## 2016-03-05 LAB — CBC WITH DIFFERENTIAL/PLATELET
BASOS PCT: 0.2 % (ref 0.0–3.0)
Basophils Absolute: 0 10*3/uL (ref 0.0–0.1)
EOS PCT: 0.8 % (ref 0.0–5.0)
Eosinophils Absolute: 0 10*3/uL (ref 0.0–0.7)
HEMATOCRIT: 42.6 % (ref 36.0–46.0)
Hemoglobin: 14.6 g/dL (ref 12.0–15.0)
Lymphocytes Relative: 25.2 % (ref 12.0–46.0)
Lymphs Abs: 1.4 10*3/uL (ref 0.7–4.0)
MCHC: 34.3 g/dL (ref 30.0–36.0)
MCV: 85.8 fl (ref 78.0–100.0)
MONO ABS: 0.5 10*3/uL (ref 0.1–1.0)
Monocytes Relative: 9.1 % (ref 3.0–12.0)
Neutro Abs: 3.7 10*3/uL (ref 1.4–7.7)
Neutrophils Relative %: 64.7 % (ref 43.0–77.0)
Platelets: 363 10*3/uL (ref 150.0–400.0)
RBC: 4.96 Mil/uL (ref 3.87–5.11)
RDW: 14.1 % (ref 11.5–15.5)
WBC: 5.7 10*3/uL (ref 4.0–10.5)

## 2016-03-05 LAB — TSH: TSH: 1.5 u[IU]/mL (ref 0.35–4.50)

## 2016-03-08 ENCOUNTER — Other Ambulatory Visit: Payer: Self-pay | Admitting: Family Medicine

## 2016-03-10 ENCOUNTER — Ambulatory Visit (INDEPENDENT_AMBULATORY_CARE_PROVIDER_SITE_OTHER): Payer: BLUE CROSS/BLUE SHIELD | Admitting: Family Medicine

## 2016-03-10 ENCOUNTER — Encounter: Payer: Self-pay | Admitting: Family Medicine

## 2016-03-10 ENCOUNTER — Other Ambulatory Visit (HOSPITAL_COMMUNITY)
Admission: RE | Admit: 2016-03-10 | Discharge: 2016-03-10 | Disposition: A | Discharge status: Home / Self Care | Payer: BLUE CROSS/BLUE SHIELD | Source: Ambulatory Visit | Attending: Family Medicine | Admitting: Family Medicine

## 2016-03-10 VITALS — BP 112/68 | HR 84 | Temp 98.3°F | Ht 66.5 in | Wt 249.0 lb

## 2016-03-10 DIAGNOSIS — Z01419 Encounter for gynecological examination (general) (routine) without abnormal findings: Secondary | ICD-10-CM | POA: Diagnosis present

## 2016-03-10 DIAGNOSIS — Z0000 Encounter for general adult medical examination without abnormal findings: Secondary | ICD-10-CM | POA: Diagnosis not present

## 2016-03-10 DIAGNOSIS — I10 Essential (primary) hypertension: Secondary | ICD-10-CM

## 2016-03-10 DIAGNOSIS — E282 Polycystic ovarian syndrome: Secondary | ICD-10-CM | POA: Diagnosis not present

## 2016-03-10 DIAGNOSIS — E669 Obesity, unspecified: Secondary | ICD-10-CM

## 2016-03-10 DIAGNOSIS — M25552 Pain in left hip: Secondary | ICD-10-CM

## 2016-03-10 DIAGNOSIS — Z1151 Encounter for screening for human papillomavirus (HPV): Secondary | ICD-10-CM | POA: Insufficient documentation

## 2016-03-10 DIAGNOSIS — J302 Other seasonal allergic rhinitis: Secondary | ICD-10-CM

## 2016-03-10 MED ORDER — FEXOFENADINE HCL 60 MG PO TABS: ORAL_TABLET | ORAL | 11 refills | Status: DC

## 2016-03-10 MED ORDER — FLUTICASONE PROPIONATE 50 MCG/ACT NA SUSP: 2.0000 | Freq: Every day | NASAL | 11 refills | Status: DC

## 2016-03-10 MED ORDER — SPIRONOLACTONE 50 MG PO TABS: ORAL_TABLET | ORAL | 11 refills | Status: DC

## 2016-03-10 NOTE — Progress Notes (Signed)
Subjective:    Patient ID: Ashley Mercado, female    DOB: Jan 09, 1971, 45 y.o.   MRN: [id number]  HPI Here for health maintenance exam and to review chronic medical problems   Doing ok overall  Still some flank pain /L hip pain (CT was ok)  Hurts to lie on that side  Makes it hard to exercise  Still takes meloxicam  Is interested in seeing orthopedics   Some pain in R great toe joint/ bunion  Has to wear wider shoes No trauma or injury   Wt Readings from Last 3 Encounters:  03/10/16 249 lb (112.9 kg)  01/12/16 251 lb 4 oz (114 kg)  03/07/15 241 lb (109.3 kg)   bmi is 39.5 Some walking for exercise when able/ with hip pain  She has done some research about bariatric surgery  She tends to crave sugar  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    Flu shot- had today   Pap 8/14- neg with neg HPV  Is not seeing a gyn  Will do a pap today  Menses-has not had a period in 2 years - was dx with menopause at that time Night sweats-tolerates them  Hx of PCOS Last glucose was 96   Mammogram 9/16-negative-will make her own appt  Self breast exam no lumps   Tetanus shot 6/08  HIV screen neg 2016  bp is stable today  No cp or palpitations or headaches or edema  No side effects to medicines  BP Readings from Last 3 Encounters:  03/10/16 112/68  01/12/16 106/64  03/07/15 110/68     On spironolactone   Results for orders placed or performed in visit on 03/05/16  CBC with Differential/Platelet  Result Value Ref Range   WBC 5.7 4.0 - 10.5 K/uL   RBC 4.96 3.87 - 5.11 Mil/uL   Hemoglobin 14.6 12.0 - 15.0 g/dL   HCT 42.6 36.0 - 46.0 %   MCV 85.8 78.0 - 100.0 fl   MCHC 34.3 30.0 - 36.0 g/dL   RDW 14.1 11.5 - 15.5 %   Platelets 363.0 150.0 - 400.0 K/uL   Neutrophils Relative % 64.7 43.0 - 77.0 %   Lymphocytes Relative 25.2 12.0 - 46.0 %   Monocytes Relative 9.1 3.0 - 12.0 %   Eosinophils Relative 0.8 0.0 - 5.0 %   Basophils Relative 0.2 0.0 - 3.0 %   Neutro Abs  3.7 1.4 - 7.7 K/uL   Lymphs Abs 1.4 0.7 - 4.0 K/uL   Monocytes Absolute 0.5 0.1 - 1.0 K/uL   Eosinophils Absolute 0.0 0.0 - 0.7 K/uL   Basophils Absolute 0.0 0.0 - 0.1 K/uL  Comprehensive metabolic panel  Result Value Ref Range   Sodium 141 135 - 145 mEq/L   Potassium 4.0 3.5 - 5.1 mEq/L   Chloride 102 96 - 112 mEq/L   CO2 32 19 - 32 mEq/L   Glucose, Bld 96 70 - 99 mg/dL   BUN 9 6 - 23 mg/dL   Creatinine, Ser 0.84 0.40 - 1.20 mg/dL   Total Bilirubin 0.7 0.2 - 1.2 mg/dL   Alkaline Phosphatase 86 39 - 117 U/L   AST 20 0 - 37 U/L   ALT 20 0 - 35 U/L   Total Protein 8.0 6.0 - 8.3 g/dL   Albumin 4.2 3.5 - 5.2 g/dL   Calcium 9.4 8.4 - 10.5 mg/dL   GFR 93.32 >60.00 mL/min  Lipid panel  Result Value Ref Range  Cholesterol 182 0 - 200 mg/dL   Triglycerides 64.0 0.0 - 149.0 mg/dL   HDL 52.70 >39.00 mg/dL   VLDL 12.8 0.0 - 40.0 mg/dL   LDL Cholesterol 116 (H) 0 - 99 mg/dL   Total CHOL/HDL Ratio 3    NonHDL 129.28   TSH  Result Value Ref Range   TSH 1.50 0.35 - 4.50 uIU/mL     Patient Active Problem List   Diagnosis Date Noted  . Encounter for routine gynecological examination 03/10/2016  . Left flank pain 01/13/2016  . Left hip pain 01/12/2016  . Low back pain 01/12/2016  . Screening for HIV (human immunodeficiency virus) 03/07/2015  . Essential hypertension 10/28/2014  . PCOS (polycystic ovarian syndrome) 10/28/2014  . Stress reaction 03/01/2014  . Amenorrhea 05/26/2012  . Other screening mammogram 02/18/2012  . Plantar fasciitis of left foot 02/18/2012  . Obesity 10/20/2011  . Elevated blood-pressure reading without diagnosis of hypertension 10/20/2011  . Routine general medical examination at a health care facility 02/06/2011  . Depression 11/04/2010  . HIDRADENITIS SUPPURATIVA 05/12/2010  . 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 THROMBOCYTOSIS 11/30/2006  . KELOID SCAR 11/29/2006  . FOLLICULITIS [id number]   Past Medical History:  Diagnosis Date  . Folliculitis   . HSV infection     . Hydradenitis   . Hypertension   . Keloid scar   . Menorrhagia    No past surgical history on file. Social History  Substance Use Topics  . Smoking status: Never Smoker  . Smokeless tobacco: Never Used  . Alcohol use 0.0 oz/week     Comment: rare   Family History  Problem Relation Age of Onset  . Hypertension Mother   . Diabetes Mother   . Hypertension Father   . Diabetes Father   . Cancer Maternal Uncle     lymphoma  . Cancer Maternal Grandmother     ovarian   Allergies  Allergen Reactions  . Other     artificial sweeteners  . Sulfa Antibiotics Rash   Current Outpatient Prescriptions on File Prior to Visit  Medication Sig Dispense Refill  . Multiple Vitamins-Minerals (MULTIVITAMIN GUMMIES ADULT PO) Take 2 each by mouth daily.    . meloxicam (MOBIC) 15 MG tablet TAKE 1 TABLET (15 MG TOTAL) BY MOUTH DAILY AS NEEDED FOR PAIN. WITH A MEAL 30 tablet 3   No current facility-administered medications on file prior to visit.     Review of Systems    Review of Systems  Constitutional: Negative for fever, appetite change, fatigue and unexpected weight change.  Eyes: Negative for pain and visual disturbance.  Respiratory: Negative for cough and shortness of breath.   Cardiovascular: Negative for cp or palpitations    Gastrointestinal: Negative for nausea, diarrhea and constipation.  Genitourinary: Negative for urgency and frequency.  Skin: Negative for pallor or rash   MSK pos for foot pain and L outer hip (trochanter) pain  Neurological: Negative for weakness, light-headedness, numbness and headaches.  Hematological: Negative for adenopathy. Does not bruise/bleed easily.  Psychiatric/Behavioral: Negative for dysphoric mood. The patient is not nervous/anxious.      Objective:   Physical Exam  Constitutional: She appears well-developed and well-nourished. No distress.  obese and well appearing   HENT:  Head: Normocephalic and atraumatic.  Right Ear: External ear  normal.  Left Ear: External ear normal.  Mouth/Throat: Oropharynx is clear and moist.  Eyes: Conjunctivae and EOM are normal. Pupils are equal, round, and reactive to light. No scleral icterus.  Neck:  Normal range of motion. Neck supple. No JVD present. Carotid bruit is not present. No thyromegaly present.  Cardiovascular: Normal rate, regular rhythm, normal heart sounds and intact distal pulses.  Exam reveals no gallop.   Pulmonary/Chest: Effort normal and breath sounds normal. No respiratory distress. She has no wheezes. She exhibits no tenderness.  Abdominal: Soft. Bowel sounds are normal. She exhibits no distension, no abdominal bruit and no mass. There is no tenderness.  Genitourinary: No breast swelling, tenderness, discharge or bleeding.  Genitourinary Comments:         Anus appears normal w/o hemorrhoids or masses     External genitalia : nl appearance and hair distribution/no lesions     Urethral meatus : nl size, no lesions or prolapse     Urethra: no masses, tenderness or scarring    Bladder : no masses or tenderness     Vagina: nl general appearance, no discharge or  Lesions, no significant cystocele  or rectocele     Cervix: no lesions/ discharge or friability    Uterus: nl size, contour, position, and mobility (not fixed) , non tender    Adnexa : no masses, tenderness, enlargement or nodularity      Breast exam: No mass, nodules, thickening, tenderness, bulging, retraction, inflamation, nipple discharge or skin changes noted.  No axillary or clavicular LA.      Musculoskeletal: Normal range of motion. She exhibits no edema or tenderness.  Pain over L greater trochanter and flank  Lymphadenopathy:    She has no cervical adenopathy.  Neurological: She is alert. She has normal reflexes. No cranial nerve deficit. She exhibits normal muscle tone. Coordination normal.  Skin: Skin is warm and dry. No rash noted. No erythema. No pallor.  No facial hair growth  today  Psychiatric: She has a normal mood and affect.          Assessment & Plan:   Problem List Items Addressed This Visit      Cardiovascular and Mediastinum   Essential hypertension    bp in fair control at this time  BP Readings from Last 1 Encounters:  03/10/16 112/68   No changes needed Disc lifstyle change with low sodium diet and exercise   Labs rev Enc wt loss       Relevant Medications   spironolactone (ALDACTONE) 50 MG tablet     Endocrine   PCOS (polycystic ovarian syndrome)    Blood glucose is normal Enc wt loss  Now menopausal  On spironolactone for this and BP        Other   Routine general medical examination at a health care facility - Primary    Reviewed health habits including diet and exercise and skin cancer prevention Reviewed appropriate screening tests for age  Also reviewed health mt list, fam hx and immunization status , as well as social and family history   See HPI Labs rev Enc wt loss Flu shot today We will call about orthopedic appointment  Don't forget to make your mammogram appointment  Labs look good  Work on a diet with lower intake of processed carbohydrates and sweets       Obesity    Discussed how this problem influences overall health and the risks it imposes  Reviewed plan for weight loss with lower calorie diet (via better food choices and also portion control or program like weight watchers) and exercise building up to or more than 30 minutes 5 days per week including some aerobic activity  Left hip pain    Rev hx and prior studies Acting more like bursitis now Ref to orthopedics for further eval      Relevant Orders   Ambulatory referral to Orthopedic Surgery   Encounter for routine gynecological examination    Exam done and pap sent  Post menopausal      Relevant Orders   Cytology - PAP    Other Visit Diagnoses    Other seasonal allergic rhinitis       Relevant Medications   fluticasone  (FLONASE) 50 MCG/ACT nasal spray

## 2016-03-10 NOTE — Patient Instructions (Addendum)
Flu shot today We will call about orthopedic appointment  Don't forget to make your mammogram appointment  Labs look good  Work on a diet with lower intake of processed carbohydrates and sweets

## 2016-03-10 NOTE — Telephone Encounter (Signed)
Pt has her CPE scheduled today, last filled on 01/12/16 #30 with 1 additional refill, please advise

## 2016-03-11 NOTE — Assessment & Plan Note (Signed)
Exam done and pap sent  Post menopausal

## 2016-03-11 NOTE — Assessment & Plan Note (Addendum)
Blood glucose is normal Enc wt loss  Now menopausal  On spironolactone for this and BP

## 2016-03-11 NOTE — Assessment & Plan Note (Signed)
bp in fair control at this time  BP Readings from Last 1 Encounters:  03/10/16 112/68   No changes needed Disc lifstyle change with low sodium diet and exercise   Labs rev Enc wt loss

## 2016-03-11 NOTE — Assessment & Plan Note (Signed)
Discussed how this problem influences overall health and the risks it imposes  Reviewed plan for weight loss with lower calorie diet (via better food choices and also portion control or program like weight watchers) and exercise building up to or more than 30 minutes 5 days per week including some aerobic activity    

## 2016-03-11 NOTE — Assessment & Plan Note (Signed)
Reviewed health habits including diet and exercise and skin cancer prevention Reviewed appropriate screening tests for age  Also reviewed health mt list, fam hx and immunization status , as well as social and family history   See HPI Labs rev Enc wt loss Flu shot today We will call about orthopedic appointment  Don't forget to make your mammogram appointment  Labs look good  Work on a diet with lower intake of processed carbohydrates and sweets

## 2016-03-11 NOTE — Assessment & Plan Note (Signed)
Rev hx and prior studies Acting more like bursitis now Ref to orthopedics for further eval

## 2016-03-12 LAB — CYTOLOGY - PAP

## 2016-03-29 ENCOUNTER — Other Ambulatory Visit: Payer: Self-pay | Admitting: Family Medicine

## 2016-03-29 DIAGNOSIS — Z1231 Encounter for screening mammogram for malignant neoplasm of breast: Secondary | ICD-10-CM

## 2016-04-02 ENCOUNTER — Ambulatory Visit
Admission: RE | Admit: 2016-04-02 | Discharge: 2016-04-02 | Disposition: A | Discharge status: Home / Self Care | Payer: BLUE CROSS/BLUE SHIELD | Source: Ambulatory Visit | Attending: Family Medicine | Admitting: Family Medicine

## 2016-04-02 DIAGNOSIS — Z1231 Encounter for screening mammogram for malignant neoplasm of breast: Secondary | ICD-10-CM

## 2016-04-21 ENCOUNTER — Ambulatory Visit (INDEPENDENT_AMBULATORY_CARE_PROVIDER_SITE_OTHER)
Admission: RE | Admit: 2016-04-21 | Discharge: 2016-04-21 | Disposition: A | Discharge status: Home / Self Care | Payer: BLUE CROSS/BLUE SHIELD | Source: Ambulatory Visit | Attending: Family Medicine | Admitting: Family Medicine

## 2016-04-21 ENCOUNTER — Encounter: Payer: Self-pay | Admitting: Family Medicine

## 2016-04-21 ENCOUNTER — Ambulatory Visit (INDEPENDENT_AMBULATORY_CARE_PROVIDER_SITE_OTHER): Payer: BLUE CROSS/BLUE SHIELD | Admitting: Family Medicine

## 2016-04-21 VITALS — BP 120/88 | HR 94 | Ht 66.5 in | Wt 248.0 lb

## 2016-04-21 DIAGNOSIS — S99912A Unspecified injury of left ankle, initial encounter: Secondary | ICD-10-CM | POA: Diagnosis not present

## 2016-04-21 DIAGNOSIS — M25572 Pain in left ankle and joints of left foot: Secondary | ICD-10-CM

## 2016-04-21 DIAGNOSIS — S8991XA Unspecified injury of right lower leg, initial encounter: Secondary | ICD-10-CM | POA: Diagnosis not present

## 2016-04-21 DIAGNOSIS — M25561 Pain in right knee: Secondary | ICD-10-CM

## 2016-04-21 DIAGNOSIS — M25472 Effusion, left ankle: Secondary | ICD-10-CM

## 2016-04-21 DIAGNOSIS — M25461 Effusion, right knee: Secondary | ICD-10-CM | POA: Diagnosis not present

## 2016-04-21 NOTE — Progress Notes (Signed)
Subjective:    Patient ID: Ashley Mercado, female    DOB: 18-Mar-1971, 45 y.o.   MRN: [id number]  HPI This is a pleasant 45 yo female who presents today with bilateral knee pain following an accident where she fell down a stair. She fell on her knees and twisted her left ankle. She had some bleeding from both knees and immediate swelling of right knee. Left ankle has swollen and she has pain with walking. She took ibuprofen 600 mg around 2 pm and applied ice. After sitting, knees and ankle very stiff and she had great difficulty going up and down stairs. She is concerned about swelling of right knee joint.   Past Medical History:  Diagnosis Date  . Folliculitis   . HSV infection   . Hydradenitis   . Hypertension   . Keloid scar   . Menorrhagia    No past surgical history on file. Family History  Problem Relation Age of Onset  . Hypertension Mother   . Diabetes Mother   . Hypertension Father   . Diabetes Father   . Cancer Maternal Uncle     lymphoma  . Cancer Maternal Grandmother     ovarian   Social History  Substance Use Topics  . Smoking status: Never Smoker  . Smokeless tobacco: Never Used  . Alcohol use 0.0 oz/week     Comment: rare      Review of Systems Per HPI    Objective:   Physical Exam  Constitutional: She is oriented to person, place, and time. She appears well-developed and well-nourished. No distress.  HENT:  Head: Normocephalic and atraumatic.  Eyes: Conjunctivae are normal.  Cardiovascular: Normal rate.   Pulmonary/Chest: Effort normal.  Musculoskeletal:       Right knee: She exhibits decreased range of motion (slightly decreased extension), swelling (moderate swelling over patella) and laceration (superficial abraision over patella). She exhibits normal alignment, no LCL laxity, normal patellar mobility and no bony tenderness. Tenderness (generalized anterior) found.       Left ankle: She exhibits decreased range of motion and swelling (lateral  mallelous). She exhibits no ecchymosis and normal pulse. Tenderness. Lateral malleolus tenderness found. No head of 5th metatarsal and no proximal fibula tenderness found. Achilles tendon exhibits no pain and no defect.  Neurological: She is alert and oriented to person, place, and time.  Skin: Skin is warm and dry. She is not diaphoretic.  Psychiatric: She has a normal mood and affect. Her behavior is normal. Judgment and thought content normal.  Vitals reviewed.     BP 120/88   Pulse 94   Ht 5' 6.5" (1.689 m)   Wt 248 lb (112.5 kg)   SpO2 99%   BMI 39.43 kg/m  Wt Readings from Last 3 Encounters:  04/21/16 248 lb (112.5 kg)  03/10/16 249 lb (112.9 kg)  01/12/16 251 lb 4 oz (114 kg)   Dg Ankle Complete Left  Result Date: 04/21/2016 CLINICAL DATA:  [place] down steps today. Pain and swelling with decreased range of motion. EXAM: LEFT ANKLE COMPLETE - 3+ VIEW COMPARISON:  None. FINDINGS: There is no evidence of fracture, dislocation, or joint effusion. There is no evidence of arthropathy or other focal bone abnormality. Soft tissues are unremarkable. IMPRESSION: Negative. Electronically Signed   By: Misty Stanley M.D.   On: 04/21/2016 16:20   Dg Knee Complete 4 Views Right  Result Date: 04/21/2016 CLINICAL DATA:  [place] down steps today. Pain with decreased range of motion. EXAM: RIGHT  KNEE - COMPLETE 4+ VIEW COMPARISON:  None. FINDINGS: No evidence of fracture, dislocation, or joint effusion. No evidence of arthropathy or other focal bone abnormality. Soft tissues are unremarkable. IMPRESSION: Negative. Electronically Signed   By: Misty Stanley M.D.   On: 04/21/2016 16:21       Assessment & Plan:  1. Injury of right knee, initial encounter - DG Knee Complete 4 Views Right; Future - normal xray, discussed results with patient - ice off and on for first 24 hours then prn, otc NSAIDs, gentle ROM several times a day, elevate when possible  2. Pain and swelling of right knee - DG Knee  Complete 4 Views Right; Future  3. Injury of left ankle, initial encounter - DG Ankle Complete Left; Future - normal xray, discussed results with patient - lace up brace applied and patient encouraged to wear during the day for comfort and support, remove several times a day and perform ROM - elevate, ice, otc NSAIDs  4. Pain and swelling of left ankle - DG Ankle Complete Left; Future   Clarene Reamer, FNP-BC  [facility]  04/22/2016 8:21 AM

## 2016-04-21 NOTE — Patient Instructions (Signed)
Wear your ankle brace during the day Elevate ankle and knee and use ice off and on for next 12 hours Can take ibuprofen 2-3 tablets every 8-12 hours as needed for pain   Ankle Sprain An ankle sprain is an injury to the strong, fibrous tissues (ligaments) that hold the bones of your ankle joint together.  CAUSES An ankle sprain is usually caused by a fall or by twisting your ankle. Ankle sprains most commonly occur when you step on the outer edge of your foot, and your ankle turns inward. People who participate in sports are more prone to these types of injuries.  SYMPTOMS   Pain in your ankle. The pain may be present at rest or only when you are trying to stand or walk.  Swelling.  Bruising. Bruising may develop immediately or within 1 to 2 days after your injury.  Difficulty standing or walking, particularly when turning corners or changing directions. DIAGNOSIS  Your caregiver will ask you details about your injury and perform a physical exam of your ankle to determine if you have an ankle sprain. During the physical exam, your caregiver will press on and apply pressure to specific areas of your foot and ankle. Your caregiver will try to move your ankle in certain ways. An X-ray exam may be done to be sure a bone was not broken or a ligament did not separate from one of the bones in your ankle (avulsion fracture).  TREATMENT  Certain types of braces can help stabilize your ankle. Your caregiver can make a recommendation for this. Your caregiver may recommend the use of medicine for pain. If your sprain is severe, your caregiver may refer you to a surgeon who helps to restore function to parts of your skeletal system (orthopedist) or a physical therapist. [place] ice to your injury for 1-2 days or as directed by your caregiver. Applying ice helps to reduce inflammation and pain.  Put ice in a plastic bag.  Place a towel between your skin and the bag.  Leave the  ice on for 15-20 minutes at a time, every 2 hours while you are awake.  Only take over-the-counter or prescription medicines for pain, discomfort, or fever as directed by your caregiver.  Elevate your injured ankle above the level of your heart as much as possible for 2-3 days.  If your caregiver recommends crutches, use them as instructed. Gradually put weight on the affected ankle. Continue to use crutches or a cane until you can walk without feeling pain in your ankle.  If you have a plaster splint, wear the splint as directed by your caregiver. Do not rest it on anything harder than a pillow for the first 24 hours. Do not put weight on it. Do not get it wet. You may take it off to take a shower or bath.  You may have been given an elastic bandage to wear around your ankle to provide support. If the elastic bandage is too tight (you have numbness or tingling in your foot or your foot becomes cold and blue), adjust the bandage to make it comfortable.  If you have an air splint, you may blow more air into it or let air out to make it more comfortable. You may take your splint off at night and before taking a shower or bath. Wiggle your toes in the splint several times per day to decrease swelling. SEEK MEDICAL CARE IF:   You have rapidly increasing bruising  or swelling.  Your toes feel extremely cold or you lose feeling in your foot.  Your pain is not relieved with medicine. SEEK IMMEDIATE MEDICAL CARE IF:  Your toes are numb or blue.  You have severe pain that is increasing. MAKE SURE YOU:   Understand these instructions.  Will watch your condition.  Will get help right away if you are not doing well or get worse.   This information is not intended to replace advice given to you by your health care provider. Make sure you discuss any questions you have with your health care provider.   Document Released: 06/21/2005 Document Revised: 07/12/2014 Document Reviewed:  07/03/2011 Elsevier Interactive Patient Education [employer].

## 2016-06-14 ENCOUNTER — Other Ambulatory Visit: Payer: Self-pay | Admitting: Family Medicine

## 2016-11-10 ENCOUNTER — Encounter: Payer: Self-pay | Admitting: Family Medicine

## 2016-11-10 ENCOUNTER — Ambulatory Visit (INDEPENDENT_AMBULATORY_CARE_PROVIDER_SITE_OTHER): Payer: BLUE CROSS/BLUE SHIELD | Admitting: Family Medicine

## 2016-11-10 VITALS — BP 116/78 | HR 88 | Temp 98.7°F | Ht 66.5 in | Wt 251.2 lb

## 2016-11-10 DIAGNOSIS — J301 Allergic rhinitis due to pollen: Secondary | ICD-10-CM | POA: Diagnosis not present

## 2016-11-10 DIAGNOSIS — G5603 Carpal tunnel syndrome, bilateral upper limbs: Secondary | ICD-10-CM

## 2016-11-10 DIAGNOSIS — J309 Allergic rhinitis, unspecified: Secondary | ICD-10-CM | POA: Insufficient documentation

## 2016-11-10 DIAGNOSIS — G5600 Carpal tunnel syndrome, unspecified upper limb: Secondary | ICD-10-CM | POA: Insufficient documentation

## 2016-11-10 MED ORDER — DICLOFENAC SODIUM 1 % TD GEL: 2.0000 g | Freq: Four times a day (QID) | TRANSDERMAL | 3 refills | Status: DC | PRN

## 2016-11-10 NOTE — Progress Notes (Signed)
Subjective:    Patient ID: Ashley Mercado, female    DOB: [birth year], 46 y.o.   MRN: [id number]  HPI 45 yo female here for c/o of allergy symptoms and hand numbness  Allergies: Pretty miserable with allergies since February  Constant pnd/throat clearing and cough  Coughs all night  No sob / no wheeze   Runny/stuffy nose and sneezing  Itching in face and ears    Last allergy tested as a child   Allegra 1/2 pill twice daily  flonase every day   Hand Numbness:  Has had for years on and off and now worse  This weekend tried to pick up a glass and dropped it due to pain  Using bio freeze -some help  L hand is worse than the right  Tingling today -goes up her arm -woke her up  Wears a wrist splint on and off (both hands) She is R handed   Does a lot of typing   As a child she ruptured a ligament in her L arm/wrist   Needs a refill of voltaren gel -for her feet as well    Patient Active Problem List   Diagnosis Date Noted  . Carpal tunnel syndrome 11/10/2016  . Allergic rhinitis 11/10/2016  . Encounter for routine gynecological examination 03/10/2016  . Left flank pain 01/13/2016  . Left hip pain 01/12/2016  . Low back pain 01/12/2016  . Screening for HIV (human immunodeficiency virus) 03/07/2015  . Essential hypertension 10/28/2014  . PCOS (polycystic ovarian syndrome) 10/28/2014  . Stress reaction 03/01/2014  . Amenorrhea 05/26/2012  . Other screening mammogram 02/18/2012  . Plantar fasciitis of left foot 02/18/2012  . Obesity 10/20/2011  . Elevated blood-pressure reading without diagnosis of hypertension 10/20/2011  . Routine general medical examination at a health care facility 02/06/2011  . Depression 11/04/2010  . HIDRADENITIS SUPPURATIVA 05/12/2010  . 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 THROMBOCYTOSIS 11/30/2006  . KELOID SCAR 11/29/2006  . FOLLICULITIS 38/93/7342   Past Medical History:  Diagnosis Date  . Folliculitis   . HSV infection   . Hydradenitis   .  Hypertension   . Keloid scar   . Menorrhagia    No past surgical history on file. Social History  Substance Use Topics  . Smoking status: Never Smoker  . Smokeless tobacco: Never Used  . Alcohol use 0.0 oz/week     Comment: rare   Family History  Problem Relation Age of Onset  . Hypertension Mother   . Diabetes Mother   . Hypertension Father   . Diabetes Father   . Cancer Maternal Uncle     lymphoma  . Cancer Maternal Grandmother     ovarian   Allergies  Allergen Reactions  . Other     artificial sweeteners  . Sulfa Antibiotics Rash   Current Outpatient Prescriptions on File Prior to Visit  Medication Sig Dispense Refill  . fluticasone (FLONASE) 50 MCG/ACT nasal spray Place 2 sprays into both nostrils daily. 16 g 11  . meloxicam (MOBIC) 15 MG tablet TAKE 1 TABLET (15 MG TOTAL) BY MOUTH DAILY AS NEEDED FOR PAIN. WITH A MEAL 30 tablet 3  . Multiple Vitamins-Minerals (MULTIVITAMIN GUMMIES ADULT PO) Take 2 each by mouth daily.    Marland Kitchen spironolactone (ALDACTONE) 50 MG tablet TAKE 1 TABLET (50 MG TOTAL) BY MOUTH DAILY. 30 tablet 11   No current facility-administered medications on file prior to visit.     Review of Systems Review of Systems  Constitutional: Negative for fever,  appetite change, fatigue and unexpected weight change.  Eyes: Negative for pain and visual disturbance.  ENT pos for cong/rhinorrhea and sneezing  Respiratory: Negative for cough and shortness of breath.   Cardiovascular: Negative for cp or palpitations    Gastrointestinal: Negative for nausea, diarrhea and constipation.  Genitourinary: Negative for urgency and frequency.  Skin: Negative for pallor or rash   Neurological: Negative for weakness, light-headedness, and headaches. pos for numbness and pain in hands and wrists  Hematological: Negative for adenopathy. Does not bruise/bleed easily.  Psychiatric/Behavioral: Negative for dysphoric mood. The patient is not nervous/anxious.         Objective:     Physical Exam  Constitutional: She appears well-developed and well-nourished. No distress.  obese and well appearing   HENT:  Head: Normocephalic and atraumatic.  Right Ear: External ear normal.  Left Ear: External ear normal.  Mouth/Throat: Oropharynx is clear and moist.  Nares are injected and congested  Clear rhinorrhea and pnd No sinus tenderness  Eyes: Conjunctivae and EOM are normal. Pupils are equal, round, and reactive to light. Right eye exhibits no discharge. Left eye exhibits no discharge.  Neck: Normal range of motion. Neck supple.  Cardiovascular: Normal rate, regular rhythm and normal heart sounds.   Pulmonary/Chest: Effort normal and breath sounds normal. No respiratory distress. She has no wheezes. She has no rales.  Musculoskeletal: She exhibits tenderness. She exhibits no edema.  Wrist tenderness bilat   Lymphadenopathy:    She has no cervical adenopathy.  Neurological: She is alert. She has normal reflexes.  Pos tinel and phalen tests bilateral  Limited grip due to pain  Dec sens to lt touch and temp in first 3 digits of L hand    Skin: Skin is warm and dry. No rash noted. No erythema. No pallor.  Psychiatric: She has a normal mood and affect.          Assessment & Plan:   Problem List Items Addressed This Visit      Respiratory   Allergic rhinitis    Worse with tree pollen Disc pollen avoidance  Change allegra to zyrtec 10 mg once daily in evening Inc flonase to bid for 3-4 weeks  Update if no imp -would consider addn of astelin or singular or ref to allergist         Nervous and Auditory   Carpal tunnel syndrome    L worse than R Pos tinel and phalen tests Failed conserv tx with splints and nsaid Will ref to neuro for eval and tx       Relevant Orders   Ambulatory referral to Neurology

## 2016-11-10 NOTE — Assessment & Plan Note (Signed)
L worse than R Pos tinel and phalen tests Failed conserv tx with splints and nsaid Will ref to neuro for eval and tx

## 2016-11-10 NOTE — Assessment & Plan Note (Signed)
Worse with tree pollen Disc pollen avoidance  Change allegra to zyrtec 10 mg once daily in evening Inc flonase to bid for 3-4 weeks  Update if no imp -would consider addn of astelin or singular or ref to allergist

## 2016-11-10 NOTE — Patient Instructions (Signed)
Stop allegra and get zyrtec 10 mg  Take one pill daily in evening  Increase your flonase to twice daily for the next 3-4 weeks (then go back to once daily) Avoid pollen when you can  Update me if not improved in a week or two   We will refer you to neurology for carpal tunnel  Wear wrist splints at night  Try the diclofenac gel

## 2016-11-10 NOTE — Progress Notes (Signed)
Pre visit review using our clinic review tool, if applicable. No additional management support is needed unless otherwise documented below in the visit note. 

## 2016-11-11 ENCOUNTER — Encounter: Payer: Self-pay | Admitting: Neurology

## 2016-11-12 ENCOUNTER — Telehealth: Payer: Self-pay | Admitting: *Deleted

## 2016-11-12 NOTE — Telephone Encounter (Signed)
PA for diclofenac done at [email or web address], it was denied, pharmacy notified PA was declined, faxed placed in Dr. Marliss Coots inbox to sign and send for scanning when she returns, please advise

## 2016-11-14 NOTE — Telephone Encounter (Signed)
Please let her know they denied it - unsure if she will want to pay for it out of pocket

## 2016-11-15 NOTE — Telephone Encounter (Signed)
Patient notified as instructed by telephone and verbalized understanding. Was advised by patient that she has already picked the script up and paid for it out of pocket.

## 2016-12-02 ENCOUNTER — Telehealth: Payer: Self-pay

## 2016-12-02 DIAGNOSIS — J301 Allergic rhinitis due to pollen: Secondary | ICD-10-CM

## 2016-12-02 MED ORDER — AZELASTINE HCL 0.15 % NA SOLN: 1.0000 | Freq: Two times a day (BID) | NASAL | 11 refills | Status: DC

## 2016-12-02 NOTE — Telephone Encounter (Signed)
Referral done Will route to pcc 

## 2016-12-02 NOTE — Telephone Encounter (Signed)
Pt left v/m; pt was seen 11/10/16; pts allergies are no better than when seen; still coughing a lot and runny nose. Pt wants to know what to do now. Pt request cb. [facility].

## 2016-12-02 NOTE — Telephone Encounter (Signed)
I sent another nasal spray called azelastine to her pharmacy to use twice daily  If fever or new symptoms let me know  Would she like a referral to an allergist?

## 2016-12-02 NOTE — Telephone Encounter (Signed)
Pt notified Rx sent to pharmacy and she does want to proceed with an allergist referral because she has been dealing with this issue for years. Please put referral in and I advise pt our [facility] will call to schedule appt

## 2017-01-13 ENCOUNTER — Ambulatory Visit
Admission: RE | Admit: 2017-01-13 | Discharge: 2017-01-13 | Disposition: A | Discharge status: Home / Self Care | Payer: BLUE CROSS/BLUE SHIELD | Source: Ambulatory Visit | Attending: Allergy | Admitting: Allergy

## 2017-01-13 ENCOUNTER — Other Ambulatory Visit: Payer: Self-pay | Admitting: Allergy

## 2017-01-13 DIAGNOSIS — R059 Cough, unspecified: Secondary | ICD-10-CM

## 2017-01-13 DIAGNOSIS — R05 Cough: Secondary | ICD-10-CM | POA: Diagnosis not present

## 2017-01-13 DIAGNOSIS — H1045 Other chronic allergic conjunctivitis: Secondary | ICD-10-CM | POA: Diagnosis not present

## 2017-01-13 DIAGNOSIS — J309 Allergic rhinitis, unspecified: Secondary | ICD-10-CM | POA: Diagnosis not present

## 2017-01-13 DIAGNOSIS — J3089 Other allergic rhinitis: Secondary | ICD-10-CM | POA: Diagnosis not present

## 2017-01-18 DIAGNOSIS — J3089 Other allergic rhinitis: Secondary | ICD-10-CM | POA: Diagnosis not present

## 2017-01-25 ENCOUNTER — Ambulatory Visit (INDEPENDENT_AMBULATORY_CARE_PROVIDER_SITE_OTHER): Payer: BLUE CROSS/BLUE SHIELD | Admitting: Neurology

## 2017-01-25 ENCOUNTER — Encounter: Payer: Self-pay | Admitting: Neurology

## 2017-01-25 VITALS — BP 120/80 | HR 91 | Ht 66.5 in | Wt 259.5 lb

## 2017-01-25 DIAGNOSIS — G5603 Carpal tunnel syndrome, bilateral upper limbs: Secondary | ICD-10-CM

## 2017-01-25 DIAGNOSIS — J3089 Other allergic rhinitis: Secondary | ICD-10-CM | POA: Diagnosis not present

## 2017-01-25 NOTE — Progress Notes (Signed)
NEUROLOGY CONSULTATION NOTE  Ashley Mercado MRN: [id number] DOB: 05/07/1971  Referring provider: Dr. Glori Bickers Primary care provider: Dr. Glori Bickers  Reason for consult:  Carpal tunnel syndrome  HISTORY OF PRESENT ILLNESS: Ashley Mercado is a 46 year old right-handed female who presents for bilateral carpal tunnel syndrome.  History supplemented by PCP note.  As a child, she had tendonitis of the wrist, which required use of braces.  She had also broke her right wrist and had to learn to write with her left hand.  For several years, she has had bilateral initermittent hand numbness.  Since February, it has become constant and more severe.  Left hand is worse than the right hand.  She reports numbness and tingling sensation in both hands, particularly of the first three digits of both hands.  She also reports shooting pain up the palms and forearms, mostly in the left hand which wakes her up at night.  She reports dropping objects. Her hands feel fatigued when writing.  She types often.  Numbness is worse when she is driving her car.  It is not particularly different when laying in bed.  She denies neck pain or radicular pain radiating down the arms.  Wrist splints help only while wearing them.  Most recent labs from 03/22/16 include normal CBC and CMP.  PAST MEDICAL HISTORY: Past Medical History:  Diagnosis Date  . Folliculitis   . HSV infection   . Hydradenitis   . Hypertension   . Keloid scar   . Menorrhagia     PAST SURGICAL HISTORY: History reviewed. No pertinent surgical history.  MEDICATIONS: Current Outpatient Prescriptions on File Prior to Visit  Medication Sig Dispense Refill  . Azelastine HCl 0.15 % SOLN Place 1-2 sprays into the nose 2 (two) times daily. 1-2 sprays each nostril twice daily as needed 30 mL 11  . diclofenac sodium (VOLTAREN) 1 % GEL Apply 2 g topically 4 (four) times daily as needed. To affected areas 1 Tube 3  . meloxicam (MOBIC) 15 MG tablet TAKE 1 TABLET (15  MG TOTAL) BY MOUTH DAILY AS NEEDED FOR PAIN. WITH A MEAL 30 tablet 3  . Multiple Vitamins-Minerals (MULTIVITAMIN GUMMIES ADULT PO) Take 2 each by mouth daily.    Marland Kitchen spironolactone (ALDACTONE) 50 MG tablet TAKE 1 TABLET (50 MG TOTAL) BY MOUTH DAILY. 30 tablet 11   No current facility-administered medications on file prior to visit.     ALLERGIES: Allergies  Allergen Reactions  . Other     artificial sweeteners  . Sulfa Antibiotics Rash    FAMILY HISTORY: Family History  Problem Relation Age of Onset  . Hypertension Father   . Diabetes Father   . Hypertension Mother   . Diabetes Mother   . Cancer Maternal Uncle        lymphoma  . Cancer Maternal Grandmother        ovarian    SOCIAL HISTORY: Social History   Social History  . Marital status: Single    Spouse name: N/A  . Number of children: 0  . Years of education: masters   Occupational History  . [employer], Technical sales engineer    Social History Main Topics  . Smoking status: Never Smoker  . Smokeless tobacco: Never Used  . Alcohol use 0.0 oz/week     Comment: rare  . Drug use: No  . Sexual activity: Not on file   Other Topics Concern  . Not on file   Social History Narrative   Private  cell: [phone].  Designated party form signed on 01/26/10 appointing no one.  May leave message on cell phone.  Lives with mom in a one story home.  No children.  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  Education: Masters       REVIEW OF SYSTEMS: Constitutional: No fevers, chills, or sweats, no generalized fatigue, change in appetite Eyes: No visual changes, double vision, eye pain Ear, nose and throat: No hearing loss, ear pain, nasal congestion, sore throat Cardiovascular: No chest pain, palpitations Respiratory:  No shortness of breath at rest or with exertion, wheezes GastrointestinaI: No nausea, vomiting, diarrhea, abdominal pain, fecal incontinence Genitourinary:  No dysuria, urinary retention or  frequency Musculoskeletal:  No neck pain, back pain Integumentary: No rash, pruritus, skin lesions Neurological: as above Psychiatric: No depression, insomnia, anxiety Endocrine: No palpitations, fatigue, diaphoresis, mood swings, change in appetite, change in weight, increased thirst Hematologic/Lymphatic:  No purpura, petechiae. Allergic/Immunologic: no itchy/runny eyes, nasal congestion, recent allergic reactions, rashes  PHYSICAL EXAM: Vitals:   01/25/17 0953  BP: 120/80  Pulse: 91   General: No acute distress.  Patient appears well-groomed.  Head:  Normocephalic/atraumatic Eyes:  fundi examined but not visualized Neck: supple, no paraspinal tenderness, full range of motion Back: No paraspinal tenderness Heart: regular rate and rhythm Lungs: Clear to auscultation bilaterally. Vascular: No carotid bruits. Neurological Exam: Mental status: alert and oriented to person, place, and time, recent and remote memory intact, fund of knowledge intact, attention and concentration intact, speech fluent and not dysarthric, language intact. Cranial nerves: CN I: not tested CN II: pupils equal, round and reactive to light, visual fields intact CN III, IV, VI:  full range of motion, no nystagmus, no ptosis CN V: facial sensation intact CN VII: upper and lower face symmetric CN VIII: hearing intact CN IX, X: gag intact, uvula midline CN XI: sternocleidomastoid and trapezius muscles intact CN XII: tongue midline Bulk & Tone: normal, no fasciculations. Motor:  5/5 throughout, including APB muscles.  Maybe slight flattening of the right thenar eminence but no appreciable atrophy. Sensation:  Reduced sensation to light touch the first three digits in the right hand and second digit in the left hand.  Temperature and vibration sensation intact in lower extremities. Deep Tendon Reflexes:  2+ throughout, toes downgoing. Finger to nose testing:  Without dysmetria.  Heel to shin:  Without  dysmetria.  Gait:  Normal station and stride.  Able to turn and tandem walk. Romberg negative. Tinel's sign:  Positive bilaterally Phalen's sign:  Positive bilaterally  IMPRESSION: Bilateral carpal tunnel syndrome  PLAN: 1.  NCV-EMG to confirm and establish severity. 2.  Afterward, will likely refer to hand specialist/surgeon 3.  In meantime, continue use of wrist splints.  Thank you for allowing me to take part in the care of this patient.  Metta Clines, DO  CC:  Loura Pardon, MD

## 2017-01-25 NOTE — Patient Instructions (Addendum)
1.  We will first get a nerve study of your hands to evaluate severity of the carpal tunnel syndrome 2.  Then I will send you to a hand specialist.  Therapy may require steroid shots or even surgery. 3.  In the meantime, use the wrist splints.   Carpal Tunnel Syndrome Carpal tunnel syndrome is a condition that causes pain in your hand and arm. The carpal tunnel is a narrow area located on the palm side of your wrist. Repeated wrist motion or certain diseases may cause swelling within the tunnel. This swelling pinches the main nerve in the wrist (median nerve). What are the causes? This condition may be caused by:  Repeated wrist motions.  Wrist injuries.  Arthritis.  A cyst or tumor in the carpal tunnel.  Fluid buildup during pregnancy.  Sometimes the cause of this condition is not known. What increases the risk? This condition is more likely to develop in:  People who have jobs that cause them to repeatedly move their wrists in the same motion, such as Art gallery manager.  Women.  People with certain conditions, such as: ? Diabetes. ? Obesity. ? An underactive thyroid (hypothyroidism). ? Kidney failure.  What are the signs or symptoms? Symptoms of this condition include:  A tingling feeling in your fingers, especially in your thumb, index, and middle fingers.  Tingling or numbness in your hand.  An aching feeling in your entire arm, especially when your wrist and elbow are bent for long periods of time.  Wrist pain that goes up your arm to your shoulder.  Pain that goes down into your palm or fingers.  A weak feeling in your hands. You may have trouble grabbing and holding items.  Your symptoms may feel worse during the night. How is this diagnosed? This condition is diagnosed with a medical history and physical exam. You may also have tests, including:  An electromyogram (EMG). This test measures electrical signals sent by your nerves into the  muscles.  X-rays.  How is this treated? Treatment for this condition includes:  Lifestyle changes. It is important to stop doing or modify the activity that caused your condition.  Physical or occupational therapy.  Medicines for pain and inflammation. This may include medicine that is injected into your wrist.  A wrist splint.  Surgery.  Follow these instructions at home: If you have a splint:  Wear it as told by your health care provider. Remove it only as told by your health care provider.  Loosen the splint if your fingers become numb and tingle, or if they turn cold and blue.  Keep the splint clean and dry. General instructions  Take over-the-counter and prescription medicines only as told by your health care provider.  Rest your wrist from any activity that may be causing your pain. If your condition is work related, talk to your employer about changes that can be made, such as getting a wrist pad to use while typing.  If directed, apply ice to the painful area: ? Put ice in a plastic bag. ? Place a towel between your skin and the bag. ? Leave the ice on for 20 minutes, 2-3 times per day.  Keep all follow-up visits as told by your health care provider. This is important.  Do any exercises as told by your health care provider, physical therapist, or occupational therapist. Contact a health care provider if:  You have new symptoms.  Your pain is not controlled with medicines.  Your symptoms  get worse. This information is not intended to replace advice given to you by your health care provider. Make sure you discuss any questions you have with your health care provider. Document Released: 06/18/2000 Document Revised: 10/30/2015 Document Reviewed: 11/06/2014 Elsevier Interactive Patient Education  2017 [employer].

## 2017-01-28 DIAGNOSIS — J3089 Other allergic rhinitis: Secondary | ICD-10-CM | POA: Diagnosis not present

## 2017-02-01 DIAGNOSIS — J3089 Other allergic rhinitis: Secondary | ICD-10-CM | POA: Diagnosis not present

## 2017-02-04 DIAGNOSIS — J3089 Other allergic rhinitis: Secondary | ICD-10-CM | POA: Diagnosis not present

## 2017-02-11 DIAGNOSIS — J3089 Other allergic rhinitis: Secondary | ICD-10-CM | POA: Diagnosis not present

## 2017-02-15 DIAGNOSIS — J3089 Other allergic rhinitis: Secondary | ICD-10-CM | POA: Diagnosis not present

## 2017-02-18 DIAGNOSIS — J3089 Other allergic rhinitis: Secondary | ICD-10-CM | POA: Diagnosis not present

## 2017-02-22 ENCOUNTER — Other Ambulatory Visit: Payer: Self-pay | Admitting: Family Medicine

## 2017-02-22 ENCOUNTER — Ambulatory Visit (INDEPENDENT_AMBULATORY_CARE_PROVIDER_SITE_OTHER): Payer: BLUE CROSS/BLUE SHIELD | Admitting: Neurology

## 2017-02-22 DIAGNOSIS — J3089 Other allergic rhinitis: Secondary | ICD-10-CM | POA: Diagnosis not present

## 2017-02-22 DIAGNOSIS — G5603 Carpal tunnel syndrome, bilateral upper limbs: Secondary | ICD-10-CM | POA: Diagnosis not present

## 2017-02-22 DIAGNOSIS — M5412 Radiculopathy, cervical region: Secondary | ICD-10-CM

## 2017-02-22 NOTE — Procedures (Signed)
[facility] Neurology  [place], [place]  [place], [place] [address] Tel: [phone] Fax:  [phone] Test Date:  02/22/2017  Patient: Ashley Mercado DOB: 1971-01-09 Physician: Narda Amber, DO  Sex: Female Height: 5\' 6"  Ref Phys: Metta Clines, D.O.  ID#: [id number] Temp: 35.5C Technician:    Patient Complaints: This is a 46 year-old female referred for evaluation of bilateral hand paresthesias.  NCV & EMG Findings: Extensive electrodiagnostic testing of the right upper extremity and additional studies of the left shows:  1. Bilateral median, ulnar, and mixed palmer sensory responses are within normal limits. 2. Bilateral median and ulnar motor responses are within normal limits. 3. Chronic motor axon loss changes are seen affecting the first dorsal interosseous, extensor indicis proprius, biceps, and deltoid muscles on the right side only. There is no evidence of accompanied active denervation.  Impression: 1. Right C5-C6 radiculopathy; mild in degree electrically. 2. Right C8 radiculopathy; mild in degree electrically. 3. There is no evidence of carpal tunnel syndrome affecting the upper extremities.   ___________________________ Narda Amber, DO    Nerve Conduction Studies Anti Sensory Summary Table   Stim Site NR Peak (ms) Norm Peak (ms) P-T Amp (V) Norm P-T Amp  Left Median Anti Sensory (2nd Digit)  35.5C  Wrist    3.1 <3.4 41.6 >20  Right Median Anti Sensory (2nd Digit)  35.5C  Wrist    2.9 <3.4 38.3 >20  Left Ulnar Anti Sensory (5th Digit)  35.5C  Wrist    2.8 <3.1 39.6 >12  Right Ulnar Anti Sensory (5th Digit)  35.5C  Wrist    2.7 <3.1 38.1 >12   Motor Summary Table   Stim Site NR Onset (ms) Norm Onset (ms) O-P Amp (mV) Norm O-P Amp Site1 Site2 Delta-0 (ms) Dist (cm) Vel (m/s) Norm Vel (m/s)  Left Median Motor (Abd Poll Brev)  35.5C  Wrist    2.9 <3.9 12.5 >6 Elbow Wrist 5.0 31.0 62 >50  Elbow    7.9  11.4         Right Median Motor (Abd Poll  Brev)  35.5C  Wrist    2.6 <3.9 13.4 >6 Elbow Wrist 4.5 31.0 69 >50  Elbow    7.1  12.8         Left Ulnar Motor (Abd Dig Minimi)  35.5C  Wrist    2.6 <3.1 8.6 >7 B Elbow Wrist 4.2 26.0 62 >50  B Elbow    6.8  7.9  A Elbow B Elbow 1.6 10.0 62 >50  A Elbow    8.4  7.7         Right Ulnar Motor (Abd Dig Minimi)  35.5C  Wrist    2.3 <3.1 9.0 >7 B Elbow Wrist 4.2 24.0 57 >50  B Elbow    6.5  8.2  A Elbow B Elbow 1.6 10.0 63 >50  A Elbow    8.1  7.8          Comparison Summary Table   Stim Site NR Peak (ms) Norm Peak (ms) P-T Amp (V) Site1 Site2 Delta-P (ms) Norm Delta (ms)  Left Median/Ulnar Palm Comparison (Wrist - 8cm)  35.5C  Median Palm    1.5 <2.2 34.2 Median Palm Ulnar Palm 0.1   Ulnar Palm    1.6 <2.2 29.0      Right Median/Ulnar Palm Comparison (Wrist - 8cm)  35.5C  Median Palm    1.4 <2.2 58.1 Median Palm Ulnar Palm 0.3  Ulnar Palm    1.7 <2.2 14.0       EMG   Side Muscle Ins Act Fibs Psw Fasc Number Recrt Dur Dur. Amp Amp. Poly Poly. Comment  Left 1stDorInt Nml Nml Nml Nml Nml Nml Nml Nml Nml Nml Nml Nml N/A  Left PronatorTeres Nml Nml Nml Nml Nml Nml Nml Nml Nml Nml Nml Nml N/A  Left Biceps Nml Nml Nml Nml Nml Nml Nml Nml Nml Nml Nml Nml N/A  Left Triceps Nml Nml Nml Nml Nml Nml Nml Nml Nml Nml Nml Nml N/A  Left Deltoid Nml Nml Nml Nml Nml Nml Nml Nml Nml Nml Nml Nml N/A  Right 1stDorInt Nml Nml Nml Nml 1- Rapid Some 1+ Some 1+ Nml Nml N/A  Right Ext Indicis Nml Nml Nml Nml 1- Rapid Some 1+ Some 1+ Nml Nml N/A  Right PronatorTeres Nml Nml Nml Nml Nml Nml Nml Nml Nml Nml Nml Nml N/A  Right Biceps Nml Nml Nml Nml 1- Rapid Few 1+ Nml Nml Nml Nml N/A  Right Triceps Nml Nml Nml Nml Nml Nml Nml Nml Nml Nml Nml Nml N/A  Right Deltoid Nml Nml Nml Nml 1- Rapid Some 1+ Some 1+ Some 1+ N/A      Waveforms:

## 2017-02-22 NOTE — Addendum Note (Signed)
Addended by: Clois Comber on: 02/22/2017 11:36 AM   Modules accepted: Orders

## 2017-02-25 DIAGNOSIS — J3089 Other allergic rhinitis: Secondary | ICD-10-CM | POA: Diagnosis not present

## 2017-03-01 DIAGNOSIS — J3089 Other allergic rhinitis: Secondary | ICD-10-CM | POA: Diagnosis not present

## 2017-03-04 DIAGNOSIS — J3089 Other allergic rhinitis: Secondary | ICD-10-CM | POA: Diagnosis not present

## 2017-03-09 ENCOUNTER — Encounter: Payer: Self-pay | Admitting: Physical Therapy

## 2017-03-09 ENCOUNTER — Ambulatory Visit: Payer: BLUE CROSS/BLUE SHIELD | Attending: Family Medicine | Admitting: Physical Therapy

## 2017-03-09 DIAGNOSIS — M6281 Muscle weakness (generalized): Secondary | ICD-10-CM | POA: Insufficient documentation

## 2017-03-09 DIAGNOSIS — M542 Cervicalgia: Secondary | ICD-10-CM | POA: Diagnosis not present

## 2017-03-09 DIAGNOSIS — R208 Other disturbances of skin sensation: Secondary | ICD-10-CM | POA: Diagnosis not present

## 2017-03-09 DIAGNOSIS — R293 Abnormal posture: Secondary | ICD-10-CM | POA: Insufficient documentation

## 2017-03-09 NOTE — Therapy (Signed)
[place], [place], [address] Phone: [phone]   Fax:  [phone]  Physical Therapy Evaluation  Patient Details  Name: Ashley Mercado MRN: [id number] Date of Birth: 05/27/1971 Referring Provider: Alda Berthold, DO  Encounter Date: 03/09/2017      PT End of Session - 03/09/17 1638    Visit Number 1   Number of Visits 13   Date for PT Re-Evaluation 04/22/17   Authorization Type BCBS   PT Start Time 1634   PT Stop Time 1712   PT Time Calculation (min) 38 min   Activity Tolerance Patient tolerated treatment well   Behavior During Therapy [facility] for tasks assessed/performed      Past Medical History:  Diagnosis Date  . Folliculitis   . HSV infection   . Hydradenitis   . Hypertension   . Keloid scar   . Menorrhagia     History reviewed. No pertinent surgical history.  There were no vitals filed for this visit.       Subjective Assessment - 03/09/17 1638    Subjective Pains are in her hands and R leg. Thought it was carpal tunnel because she types a lot at work. Used to lift weights but cannot because it feels like something is pulling along her R shoulder. When she was 66, she slipped on ice. Migrains that begin at the base of her skull and refer to eyes (tension HA). Is a Technical sales engineer, does a lot of typing and walking. More radicular symptoms noted in R- feels like a tingling. Times where grip strength is not available to her.    Currently in Pain? Yes   Pain Score 1    Pain Location Neck   Pain Orientation Right;Left   Pain Descriptors / Indicators --  pulling   Aggravating Factors  lifting something heavy, sitting at computer long hours   Pain Relieving Factors heat on hands            [facility] PT Assessment - 03/09/17 0001      Assessment   Medical Diagnosis cervicalgia with radiculopathy   Referring Provider Narda Amber K, DO   Onset Date/Surgical Date --  a couple of years    Hand Dominance Right   Next MD Visit PRN   Prior Therapy no     Precautions   Precautions None     Restrictions   Weight Bearing Restrictions No     Balance Screen   Has the patient fallen in the past 6 months No     [place] residence   Living Arrangements Parent   Additional Comments 1-2 steps     Prior Function   Level of Independence Independent   Vocation Full time employment   Biomedical scientist on computer, Psychologist, clinical     Cognition   Overall Cognitive Status Within Functional Limits for tasks assessed     Observation/Other Assessments   Focus on Therapeutic Outcomes (FOTO)  44% limitation     Sensation   Additional Comments decreased sensation along R UE dermatomes     ROM / Strength   AROM / PROM / Strength AROM;Strength     AROM   AROM Assessment Site Cervical   Cervical Flexion --  48   Cervical Extension 30  post cervical pain   Cervical - Right Side Bend 40   Cervical - Left Side Bend 40  R cervical stretch     Strength  Strength Assessment Site Shoulder;Hand   Right/Left Shoulder Right;Left   Right Shoulder External Rotation 4/5   Left Shoulder External Rotation 4/5   Right/Left hand Right;Left   Right Hand Grip (lbs) 50   Left Hand Grip (lbs) 60            Objective measurements completed on examination: See above findings.          [facility] Adult PT Treatment/Exercise - 03/09/17 0001      Exercises   Exercises Shoulder     Shoulder Exercises: Seated   Other Seated Exercises scapular retraction   Other Seated Exercises cervical retraction     Shoulder Exercises: Stretch   Other Shoulder Stretches upper trap & levator stretch                PT Education - 03/09/17 2030    Education provided Yes   Education Details anatomy of condition, POC, HEP, exercise form/rationale   Person(s) Educated Patient   Methods Explanation;Demonstration;Tactile cues;Verbal  cues;Handout   Comprehension Verbalized understanding;Returned demonstration;Verbal cues required;Tactile cues required;Need further instruction          PT Short Term Goals - 03/09/17 1721      PT SHORT TERM GOAL #1   Title Pt will be able to utilize postural alignment changes to decrease N/T in hands   Baseline began educating at eval   Time 3   Period Weeks   Status New   Target Date 04/01/17           PT Long Term Goals - 03/09/17 1722      PT LONG TERM GOAL #1   Title GHJ ext rotation strength to 5/5 bilat to indicate appropraite strength to support posture   Baseline 4/5 bilat at eval   Time 6   Period Weeks   Status New   Target Date 04/22/17     PT LONG TERM GOAL #2   Title Pt will be able to lift/carry with R hand without losing grip strength   Baseline happens frequently at eval   Time 6   Period Weeks   Status New   Target Date 04/22/17     PT LONG TERM GOAL #3   Title pt will verbalize improvement in HA pain by utilizing postural alignment   Baseline frequent tension HA at eval   Time 6   Period Weeks   Status New   Target Date 04/22/17     PT LONG TERM GOAL #4   Title FOTO to 35% limitation to indicate significant improvement in functional ability   Baseline 44% limitation at eval   Time 6   Period Weeks   Status New   Target Date 04/22/17                Plan - 03/09/17 1714    Clinical Impression Statement Pt presents to PT with complaints of bilateral hand N/T and cervical pain accompanied by LBP and R LE pain. Notable forward head posture with slightly rounded shoulders creating shortening and tightness of suboccipitals and compression along cervical spine. Pt will benefit from skilled PT in order to improve postural alignment, strength and endurance to decrease compression at cervical spine.    History and Personal Factors relevant to plan of care: HTN   Clinical Presentation Stable   Clinical Presentation due to: n/a   Clinical  Decision Making Low   Rehab Potential Good   PT Frequency 2x / week   PT Duration 6 weeks  PT Treatment/Interventions ADLs/Self Care Home Management;Cryotherapy;Electrical Stimulation;Iontophoresis 4mg /ml Dexamethasone;Moist Heat;Traction;Ultrasound;Functional mobility training;Patient/family education;Therapeutic exercise;Therapeutic activities;Manual techniques;Passive range of motion;Dry needling;Taping   PT Next Visit Plan (pt will be out of town for 1 week after eval)    deep neck flexor training, periscapular strength   PT Home Exercise Plan scapular & cervical retraction, door pec stretch, upper trap & levator stretch   Consulted and Agree with Plan of Care Patient      Patient will benefit from skilled therapeutic intervention in order to improve the following deficits and impairments:  Impaired UE functional use, Increased muscle spasms, Decreased activity tolerance, Pain, Improper body mechanics, Impaired flexibility, Decreased strength, Postural dysfunction, Impaired sensation  Visit Diagnosis: Cervicalgia - Plan: PT plan of care cert/re-cert  Other disturbances of skin sensation - Plan: PT plan of care cert/re-cert  Muscle weakness (generalized) - Plan: PT plan of care cert/re-cert  Abnormal posture - Plan: PT plan of care cert/re-cert     Problem List Patient Active Problem List   Diagnosis Date Noted  . Carpal tunnel syndrome 11/10/2016  . Allergic rhinitis 11/10/2016  . Encounter for routine gynecological examination 03/10/2016  . Left flank pain 01/13/2016  . Left hip pain 01/12/2016  . Low back pain 01/12/2016  . Screening for HIV (human immunodeficiency virus) 03/07/2015  . Essential hypertension 10/28/2014  . PCOS (polycystic ovarian syndrome) 10/28/2014  . Stress reaction 03/01/2014  . Amenorrhea 05/26/2012  . Other screening mammogram 02/18/2012  . Plantar fasciitis of left foot 02/18/2012  . Obesity 10/20/2011  . Elevated blood-pressure reading without  diagnosis of hypertension 10/20/2011  . Routine general medical examination at a health care facility 02/06/2011  . Depression 11/04/2010  . HIDRADENITIS SUPPURATIVA 05/12/2010  . 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 THROMBOCYTOSIS 11/30/2006  . KELOID SCAR 11/29/2006  . FOLLICULITIS 79/48/0165    Daris Aristizabal C. Quinci Gavidia PT, DPT 03/09/17 8:37 PM   [facility] [address] [place], [place], [address] Phone: [phone]   Fax:  [phone]  Name: Terasa Orsini MRN: [id number] Date of Birth: 1970/12/14

## 2017-03-11 DIAGNOSIS — J3089 Other allergic rhinitis: Secondary | ICD-10-CM | POA: Diagnosis not present

## 2017-03-22 DIAGNOSIS — J3089 Other allergic rhinitis: Secondary | ICD-10-CM | POA: Diagnosis not present

## 2017-03-29 ENCOUNTER — Ambulatory Visit: Payer: BLUE CROSS/BLUE SHIELD | Admitting: Physical Therapy

## 2017-03-29 ENCOUNTER — Encounter: Payer: Self-pay | Admitting: Physical Therapy

## 2017-03-29 DIAGNOSIS — M6281 Muscle weakness (generalized): Secondary | ICD-10-CM | POA: Diagnosis not present

## 2017-03-29 DIAGNOSIS — R293 Abnormal posture: Secondary | ICD-10-CM

## 2017-03-29 DIAGNOSIS — M542 Cervicalgia: Secondary | ICD-10-CM

## 2017-03-29 DIAGNOSIS — R208 Other disturbances of skin sensation: Secondary | ICD-10-CM | POA: Diagnosis not present

## 2017-03-29 NOTE — Therapy (Signed)
[place], [place], [address] Phone: [phone]   Fax:  [phone]  Physical Therapy Treatment  Patient Details  Name: Ashley Mercado MRN: [id number] Date of Birth: [birth year] Referring Provider: Alda Berthold, DO  Encounter Date: 03/29/2017      PT End of Session - 03/29/17 1541    Visit Number 2   Number of Visits 13   Date for PT Re-Evaluation 04/22/17   Authorization Type BCBS   PT Start Time 3536   PT Stop Time 1622   PT Time Calculation (min) 41 min   Activity Tolerance Patient tolerated treatment well   Behavior During Therapy [facility] for tasks assessed/performed      Past Medical History:  Diagnosis Date  . Folliculitis   . HSV infection   . Hydradenitis   . Hypertension   . Keloid scar   . Menorrhagia     History reviewed. No pertinent surgical history.  There were no vitals filed for this visit.      Subjective Assessment - 03/29/17 1542    Subjective Reports doing her exercises, except stretching at the door. Overall feels fine. Has been trying to make effort to sit upright. N/T not as bad but has been very active the last couple of weeks. Most issues still when sleeping and laying down on side.    Currently in Pain? No/denies                         [facility] Adult PT Treatment/Exercise - 03/29/17 0001      Shoulder Exercises: Supine   Horizontal ABduction 15 reps   Theraband Level (Shoulder Horizontal ABduction) Level 3 (Green)   Horizontal ABduction Limitations +gentle cervical retraction; straight & diagonals   Other Supine Exercises cervical retraction + head lift     Shoulder Exercises: Seated   External Rotation 15 reps   Theraband Level (Shoulder External Rotation) Level 3 (Green)   External Rotation Limitations bilateral     Shoulder Exercises: Prone   Retraction 15 reps   Extension 15 reps  retraction + ext   Horizontal ABduction 1 15 reps     Shoulder Exercises: ROM/Strengthening   UBE (Upper Arm Bike) retro 3 min L2     Shoulder Exercises: Stretch   Table Stretch - ABduction Limitations supine pec stretch     Manual Therapy   Manual Therapy Soft tissue mobilization;Manual Traction;Joint mobilization;Myofascial release   Joint Mobilization gross cervical lateral mobilizations   Soft tissue mobilization bilateral upper trap   Myofascial Release suboccipital release   Manual Traction manual cervical traction                PT Education - 03/29/17 1625    Education provided Yes   Education Details exercise form/rationale, postural alignment, use of pillows for cervical support   Person(s) Educated Patient   Methods Explanation;Demonstration;Tactile cues;Verbal cues;Handout   Comprehension Verbalized understanding;Returned demonstration;Verbal cues required;Tactile cues required;Need further instruction          PT Short Term Goals - 03/29/17 1546      PT SHORT TERM GOAL #1   Title Pt will be able to utilize postural alignment changes to decrease N/T in hands   Baseline has been able since eval   Status Achieved           PT Long Term Goals - 03/09/17 1722      PT LONG TERM GOAL #1  Title GHJ ext rotation strength to 5/5 bilat to indicate appropraite strength to support posture   Baseline 4/5 bilat at eval   Time 6   Period Weeks   Status New   Target Date 04/22/17     PT LONG TERM GOAL #2   Title Pt will be able to lift/carry with R hand without losing grip strength   Baseline happens frequently at eval   Time 6   Period Weeks   Status New   Target Date 04/22/17     PT LONG TERM GOAL #3   Title pt will verbalize improvement in HA pain by utilizing postural alignment   Baseline frequent tension HA at eval   Time 6   Period Weeks   Status New   Target Date 04/22/17     PT LONG TERM GOAL #4   Title FOTO to 35% limitation to indicate significant improvement in functional ability    Baseline 44% limitation at eval   Time 6   Period Weeks   Status New   Target Date 04/22/17               Plan - 03/29/17 1623    Clinical Impression Statement Pt was challenged by exercises today but was able to complete with good form. cont significant tightness of bilateral upper traps, will incoorporate DN next visit.    PT Treatment/Interventions ADLs/Self Care Home Management;Cryotherapy;Electrical Stimulation;Iontophoresis 4mg /ml Dexamethasone;Moist Heat;Traction;Ultrasound;Functional mobility training;Patient/family education;Therapeutic exercise;Therapeutic activities;Manual techniques;Passive range of motion;Dry needling;Taping   PT Next Visit Plan deep neck flexor strength-try prone, periscapular strength/postural endurance, DN & cerivcal/thoracic mobs   PT Home Exercise Plan scapular & cervical retraction, door pec stretch, upper trap & levator stretch; prone I,Y,T, GHJ ER   Consulted and Agree with Plan of Care Patient      Patient will benefit from skilled therapeutic intervention in order to improve the following deficits and impairments:  Impaired UE functional use, Increased muscle spasms, Decreased activity tolerance, Pain, Improper body mechanics, Impaired flexibility, Decreased strength, Postural dysfunction, Impaired sensation  Visit Diagnosis: Cervicalgia  Other disturbances of skin sensation  Muscle weakness (generalized)  Abnormal posture     Problem List Patient Active Problem List   Diagnosis Date Noted  . Carpal tunnel syndrome 11/10/2016  . Allergic rhinitis 11/10/2016  . Encounter for routine gynecological examination 03/10/2016  . Left flank pain 01/13/2016  . Left hip pain 01/12/2016  . Low back pain 01/12/2016  . Screening for HIV (human immunodeficiency virus) 03/07/2015  . Essential hypertension 10/28/2014  . PCOS (polycystic ovarian syndrome) 10/28/2014  . Stress reaction 03/01/2014  . Amenorrhea 05/26/2012  . Other screening  mammogram 02/18/2012  . Plantar fasciitis of left foot 02/18/2012  . Obesity 10/20/2011  . Elevated blood-pressure reading without diagnosis of hypertension 10/20/2011  . Routine general medical examination at a health care facility 02/06/2011  . Depression 11/04/2010  . HIDRADENITIS SUPPURATIVA 05/12/2010  . 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 THROMBOCYTOSIS 11/30/2006  . KELOID SCAR 11/29/2006  . FOLLICULITIS 66/44/0347   Keithon Mccoin C. Ericha Whittingham PT, DPT 03/29/17 4:26 PM   [facility] [address] [place], [place], [address] Phone: [phone]   Fax:  [phone]  Name: Ashley Mercado MRN: [id number] Date of Birth: 11-30-1970

## 2017-03-30 ENCOUNTER — Telehealth: Payer: Self-pay | Admitting: Family Medicine

## 2017-03-30 DIAGNOSIS — Z0000 Encounter for general adult medical examination without abnormal findings: Secondary | ICD-10-CM

## 2017-03-30 DIAGNOSIS — I10 Essential (primary) hypertension: Secondary | ICD-10-CM

## 2017-03-30 DIAGNOSIS — E282 Polycystic ovarian syndrome: Secondary | ICD-10-CM

## 2017-03-30 NOTE — Telephone Encounter (Signed)
-----   Message from Ellamae Sia sent at 03/21/2017  2:42 PM EDT ----- Regarding: Lab orders for Friday, 9.28.18 Patient is scheduled for CPX labs, please order future labs, Thanks , Karna Christmas

## 2017-03-31 ENCOUNTER — Ambulatory Visit: Payer: BLUE CROSS/BLUE SHIELD | Admitting: Physical Therapy

## 2017-03-31 ENCOUNTER — Encounter: Payer: Self-pay | Admitting: Physical Therapy

## 2017-03-31 DIAGNOSIS — R293 Abnormal posture: Secondary | ICD-10-CM | POA: Diagnosis not present

## 2017-03-31 DIAGNOSIS — M542 Cervicalgia: Secondary | ICD-10-CM

## 2017-03-31 DIAGNOSIS — R208 Other disturbances of skin sensation: Secondary | ICD-10-CM | POA: Diagnosis not present

## 2017-03-31 DIAGNOSIS — M6281 Muscle weakness (generalized): Secondary | ICD-10-CM | POA: Diagnosis not present

## 2017-03-31 NOTE — Therapy (Signed)
[place], [place], [address] Phone: [phone]   Fax:  [phone]  Physical Therapy Treatment  Patient Details  Name: Ashley Mercado MRN: [id number] Date of Birth: Jul 06, 1970 Referring Provider: Alda Berthold, DO  Encounter Date: 03/31/2017      PT End of Session - 03/31/17 1631    Visit Number 3   Number of Visits 13   Date for PT Re-Evaluation 04/22/17   Authorization Type BCBS   PT Start Time 1631   PT Stop Time 1711   PT Time Calculation (min) 40 min   Activity Tolerance Patient tolerated treatment well   Behavior During Therapy [facility] for tasks assessed/performed      Past Medical History:  Diagnosis Date  . Folliculitis   . HSV infection   . Hydradenitis   . Hypertension   . Keloid scar   . Menorrhagia     History reviewed. No pertinent surgical history.  There were no vitals filed for this visit.      Subjective Assessment - 03/31/17 1631    Subjective Reports feeling a little better, denies pain today.    Currently in Pain? No/denies                         [facility] Adult PT Treatment/Exercise - 03/31/17 0001      Shoulder Exercises: Supine   Other Supine Exercises cervical retraction + head lift     Shoulder Exercises: Prone   Retraction Limitations on elbows   Flexion Limitations qped cervical retraction + GHJ flx   Horizontal ABduction 1 Limitations qped cervical retraction + horiz abd   Other Prone Exercises 2# row to triceps kick x20 each   Other Prone Exercises superman x10 5s holds     Shoulder Exercises: ROM/Strengthening   UBE (Upper Arm Bike) retro 4 min L2     Shoulder Exercises: Stretch   Other Shoulder Stretches upper trap & levator stretch     Manual Therapy   Joint Mobilization gross cervical lateral mobilizations   Soft tissue mobilization bilateral upper traps, levator, cervical paraspinals   Manual Traction manual cervical traction                   PT Short Term Goals - 03/29/17 1546      PT SHORT TERM GOAL #1   Title Pt will be able to utilize postural alignment changes to decrease N/T in hands   Baseline has been able since eval   Status Achieved           PT Long Term Goals - 03/09/17 1722      PT LONG TERM GOAL #1   Title GHJ ext rotation strength to 5/5 bilat to indicate appropraite strength to support posture   Baseline 4/5 bilat at eval   Time 6   Period Weeks   Status New   Target Date 04/22/17     PT LONG TERM GOAL #2   Title Pt will be able to lift/carry with R hand without losing grip strength   Baseline happens frequently at eval   Time 6   Period Weeks   Status New   Target Date 04/22/17     PT LONG TERM GOAL #3   Title pt will verbalize improvement in HA pain by utilizing postural alignment   Baseline frequent tension HA at eval   Time 6   Period Weeks   Status New  Target Date 04/22/17     PT LONG TERM GOAL #4   Title FOTO to 35% limitation to indicate significant improvement in functional ability   Baseline 44% limitation at eval   Time 6   Period Weeks   Status New   Target Date 04/22/17               Plan - 03/31/17 1712    Clinical Impression Statement Fatigued in exercises to challenge endurance of postural extensors. discussed habitual changes such as setting a timer to move every hour, she has obtained a standing desk. Notable trigger points reduced with manual treatment today and pt verbalized a decrease in concordant pain.    PT Treatment/Interventions ADLs/Self Care Home Management;Cryotherapy;Electrical Stimulation;Iontophoresis 4mg /ml Dexamethasone;Moist Heat;Traction;Ultrasound;Functional mobility training;Patient/family education;Therapeutic exercise;Therapeutic activities;Manual techniques;Passive range of motion;Dry needling;Taping   PT Next Visit Plan postural extensor endurance   PT Home Exercise Plan scapular & cervical retraction, door  pec stretch, upper trap & levator stretch; prone I,Y,T, GHJ ER; prone superman   Consulted and Agree with Plan of Care Patient      Patient will benefit from skilled therapeutic intervention in order to improve the following deficits and impairments:  Impaired UE functional use, Increased muscle spasms, Decreased activity tolerance, Pain, Improper body mechanics, Impaired flexibility, Decreased strength, Postural dysfunction, Impaired sensation  Visit Diagnosis: Cervicalgia  Other disturbances of skin sensation  Muscle weakness (generalized)  Abnormal posture     Problem List Patient Active Problem List   Diagnosis Date Noted  . Carpal tunnel syndrome 11/10/2016  . Allergic rhinitis 11/10/2016  . Encounter for routine gynecological examination 03/10/2016  . Left flank pain 01/13/2016  . Left hip pain 01/12/2016  . Low back pain 01/12/2016  . Screening for HIV (human immunodeficiency virus) 03/07/2015  . Essential hypertension 10/28/2014  . PCOS (polycystic ovarian syndrome) 10/28/2014  . Stress reaction 03/01/2014  . Amenorrhea 05/26/2012  . Other screening mammogram 02/18/2012  . Plantar fasciitis of left foot 02/18/2012  . Obesity 10/20/2011  . Elevated blood-pressure reading without diagnosis of hypertension 10/20/2011  . Routine general medical examination at a health care facility 02/06/2011  . Depression 11/04/2010  . HIDRADENITIS SUPPURATIVA 05/12/2010  . 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 THROMBOCYTOSIS 11/30/2006  . KELOID SCAR 11/29/2006  . FOLLICULITIS 40/02/6760    Ashley Mercado C. Nikolus Marczak PT, DPT 03/31/17 5:17 PM   [facility] [address] [place], [place], [address] Phone: [phone]   Fax:  [phone]  Name: Ashley Mercado MRN: [id number] Date of Birth: Oct 17, 1970

## 2017-04-01 ENCOUNTER — Other Ambulatory Visit: Payer: Self-pay | Admitting: Family Medicine

## 2017-04-01 ENCOUNTER — Other Ambulatory Visit (INDEPENDENT_AMBULATORY_CARE_PROVIDER_SITE_OTHER): Payer: BLUE CROSS/BLUE SHIELD

## 2017-04-01 DIAGNOSIS — E282 Polycystic ovarian syndrome: Secondary | ICD-10-CM | POA: Diagnosis not present

## 2017-04-01 DIAGNOSIS — I10 Essential (primary) hypertension: Secondary | ICD-10-CM | POA: Diagnosis not present

## 2017-04-01 DIAGNOSIS — Z1231 Encounter for screening mammogram for malignant neoplasm of breast: Secondary | ICD-10-CM

## 2017-04-01 DIAGNOSIS — Z0000 Encounter for general adult medical examination without abnormal findings: Secondary | ICD-10-CM | POA: Diagnosis not present

## 2017-04-01 DIAGNOSIS — J3089 Other allergic rhinitis: Secondary | ICD-10-CM | POA: Diagnosis not present

## 2017-04-01 LAB — CBC WITH DIFFERENTIAL/PLATELET
BASOS ABS: 0 10*3/uL (ref 0.0–0.1)
BASOS PCT: 0.8 % (ref 0.0–3.0)
EOS ABS: 0.1 10*3/uL (ref 0.0–0.7)
Eosinophils Relative: 1.6 % (ref 0.0–5.0)
HCT: 43.2 % (ref 36.0–46.0)
Hemoglobin: 14.2 g/dL (ref 12.0–15.0)
LYMPHS ABS: 1.6 10*3/uL (ref 0.7–4.0)
Lymphocytes Relative: 36.5 % (ref 12.0–46.0)
MCHC: 33 g/dL (ref 30.0–36.0)
MCV: 88 fl (ref 78.0–100.0)
MONOS PCT: 8.9 % (ref 3.0–12.0)
Monocytes Absolute: 0.4 10*3/uL (ref 0.1–1.0)
NEUTROS ABS: 2.3 10*3/uL (ref 1.4–7.7)
NEUTROS PCT: 52.2 % (ref 43.0–77.0)
PLATELETS: 458 10*3/uL — AB (ref 150.0–400.0)
RBC: 4.91 Mil/uL (ref 3.87–5.11)
RDW: 13.5 % (ref 11.5–15.5)
WBC: 4.3 10*3/uL (ref 4.0–10.5)

## 2017-04-01 LAB — COMPREHENSIVE METABOLIC PANEL
ALT: 12 U/L (ref 0–35)
AST: 13 U/L (ref 0–37)
Albumin: 3.8 g/dL (ref 3.5–5.2)
Alkaline Phosphatase: 76 U/L (ref 39–117)
BILIRUBIN TOTAL: 0.4 mg/dL (ref 0.2–1.2)
BUN: 9 mg/dL (ref 6–23)
CALCIUM: 9.2 mg/dL (ref 8.4–10.5)
CHLORIDE: 100 meq/L (ref 96–112)
CO2: 31 meq/L (ref 19–32)
Creatinine, Ser: 0.81 mg/dL (ref 0.40–1.20)
GFR: 96.88 mL/min (ref >60.00)
GLUCOSE: 99 mg/dL (ref 70–99)
Potassium: 4 mEq/L (ref 3.5–5.1)
Sodium: 136 mEq/L (ref 135–145)
Total Protein: 7.5 g/dL (ref 6.0–8.3)

## 2017-04-01 LAB — LIPID PANEL
CHOL/HDL RATIO: 4
CHOLESTEROL: 178 mg/dL (ref 0–200)
HDL: 41.4 mg/dL (ref >39.00)
LDL CALC: 123 mg/dL — AB (ref 0–99)
NonHDL: 136.15
TRIGLYCERIDES: 68 mg/dL (ref 0.0–149.0)
VLDL: 13.6 mg/dL (ref 0.0–40.0)

## 2017-04-01 LAB — TSH: TSH: 0.76 u[IU]/mL (ref 0.35–4.50)

## 2017-04-01 LAB — HEMOGLOBIN A1C: Hgb A1c MFr Bld: 6.1 % (ref 4.6–6.5)

## 2017-04-05 ENCOUNTER — Ambulatory Visit: Payer: BLUE CROSS/BLUE SHIELD | Attending: Family Medicine | Admitting: Physical Therapy

## 2017-04-05 ENCOUNTER — Encounter: Payer: Self-pay | Admitting: Physical Therapy

## 2017-04-05 DIAGNOSIS — R293 Abnormal posture: Secondary | ICD-10-CM | POA: Diagnosis not present

## 2017-04-05 DIAGNOSIS — M6281 Muscle weakness (generalized): Secondary | ICD-10-CM

## 2017-04-05 DIAGNOSIS — R208 Other disturbances of skin sensation: Secondary | ICD-10-CM

## 2017-04-05 DIAGNOSIS — M542 Cervicalgia: Secondary | ICD-10-CM

## 2017-04-05 NOTE — Therapy (Signed)
[place], [place], [address] Phone: [phone]   Fax:  [phone]  Physical Therapy Treatment  Patient Details  Name: Ashley Mercado MRN: [id number] Date of Birth: [birth year] Referring Provider: Alda Berthold, DO  Encounter Date: 04/05/2017      PT End of Session - 04/05/17 1734    Visit Number 4   Number of Visits 13   Date for PT Re-Evaluation 04/22/17   PT Start Time 9924   PT Stop Time 1725   PT Time Calculation (min) 50 min   Activity Tolerance Patient tolerated treatment well   Behavior During Therapy [facility] for tasks assessed/performed      Past Medical History:  Diagnosis Date  . Folliculitis   . HSV infection   . Hydradenitis   . Hypertension   . Keloid scar   . Menorrhagia     History reviewed. No pertinent surgical history.  There were no vitals filed for this visit.      Subjective Assessment - 04/05/17 1638    Subjective "when I leave here I feel pretty good then the tingling comes back"    Currently in Pain? Yes   Pain Score 3    Pain Location Neck   Pain Orientation Right;Left   Pain Type Chronic pain   Aggravating Factors  lifting, typing,    Pain Relieving Factors heat                         [facility] Adult PT Treatment/Exercise - 04/05/17 1730      Neck Exercises: Supine   Neck Retraction 10 reps;5 secs  chin tuck with head lift off table     Shoulder Exercises: Seated   Retraction Strengthening;Both;15 reps;Theraband  with bil External rotation   Theraband Level (Shoulder Retraction) Level 2 (Red)   Row Limitations --   Horizontal ABduction Strengthening;Both;10 reps;Theraband  x 2 sets    Theraband Level (Shoulder Horizontal ABduction) Level 2 (Red)   Other Seated Exercises thoracic extension 2 x 12 over back of the chair with hands behind head and elbows adducted   Other Seated Exercises lower trap strengthening 2 x 12 with red theraband   with elbows on bolster     Manual Therapy   Joint Mobilization R 1st rib mob grade 3 with pt deep breathing for mob with movement   Soft tissue mobilization IASTM over R upper trap/ levator scapulae/ scalenes     Neck Exercises: Stretches   Upper Trapezius Stretch 2 reps;30 seconds          Trigger Point Dry Needling - 04/05/17 1739    Consent Given? Yes   Education Handout Provided Yes   Muscles Treated Upper Body Upper trapezius  scalenes on R only   Upper Trapezius Response Twitch reponse elicited;Palpable increased muscle length  R only              PT Education - 04/05/17 1737    Education provided Yes   Education Details muscle anatomy and referral patterns. What TPDN is, benefits/what to expect and after care.    Person(s) Educated Patient   Methods Explanation;Verbal cues   Comprehension Verbalized understanding;Verbal cues required          PT Short Term Goals - 03/29/17 1546      PT SHORT TERM GOAL #1   Title Pt will be able to utilize postural alignment changes to decrease N/T in hands  Baseline has been able since eval   Status Achieved           PT Long Term Goals - 03/09/17 1722      PT LONG TERM GOAL #1   Title GHJ ext rotation strength to 5/5 bilat to indicate appropraite strength to support posture   Baseline 4/5 bilat at eval   Time 6   Period Weeks   Status New   Target Date 04/22/17     PT LONG TERM GOAL #2   Title Pt will be able to lift/carry with R hand without losing grip strength   Baseline happens frequently at eval   Time 6   Period Weeks   Status New   Target Date 04/22/17     PT LONG TERM GOAL #3   Title pt will verbalize improvement in HA pain by utilizing postural alignment   Baseline frequent tension HA at eval   Time 6   Period Weeks   Status New   Target Date 04/22/17     PT LONG TERM GOAL #4   Title FOTO to 35% limitation to indicate significant improvement in functional ability   Baseline 44%  limitation at eval   Time 6   Period Weeks   Status New   Target Date 04/22/17               Plan - 04/05/17 1735    Clinical Impression Statement continued working on posture utilizing extensors. Educated and performed TPDN over R upper trap/ scalenes. Soft tissue work and mobs for 1st rib mobility were performed. post session she declined modalities and reported soreness from DN but decreased N/T in the R hand.    PT Treatment/Interventions ADLs/Self Care Home Management;Cryotherapy;Electrical Stimulation;Iontophoresis 4mg /ml Dexamethasone;Moist Heat;Traction;Ultrasound;Functional mobility training;Patient/family education;Therapeutic exercise;Therapeutic activities;Manual techniques;Passive range of motion;Dry needling;Taping   PT Next Visit Plan postural extensor endurance, How was DN, thoracic mobility   PT Home Exercise Plan scapular & cervical retraction, door pec stretch, upper trap & levator stretch; prone I,Y,T, GHJ ER; prone superman   Consulted and Agree with Plan of Care Patient      Patient will benefit from skilled therapeutic intervention in order to improve the following deficits and impairments:  Impaired UE functional use, Increased muscle spasms, Decreased activity tolerance, Pain, Improper body mechanics, Impaired flexibility, Decreased strength, Postural dysfunction, Impaired sensation  Visit Diagnosis: Cervicalgia  Other disturbances of skin sensation  Muscle weakness (generalized)  Abnormal posture     Problem List Patient Active Problem List   Diagnosis Date Noted  . Carpal tunnel syndrome 11/10/2016  . Allergic rhinitis 11/10/2016  . Encounter for routine gynecological examination 03/10/2016  . Left flank pain 01/13/2016  . Left hip pain 01/12/2016  . Low back pain 01/12/2016  . Screening for HIV (human immunodeficiency virus) 03/07/2015  . Essential hypertension 10/28/2014  . PCOS (polycystic ovarian syndrome) 10/28/2014  . Stress reaction  03/01/2014  . Amenorrhea 05/26/2012  . Other screening mammogram 02/18/2012  . Plantar fasciitis of left foot 02/18/2012  . Obesity 10/20/2011  . Elevated blood-pressure reading without diagnosis of hypertension 10/20/2011  . Routine general medical examination at a health care facility 02/06/2011  . Depression 11/04/2010  . HIDRADENITIS SUPPURATIVA 05/12/2010  . 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 THROMBOCYTOSIS 11/30/2006  . KELOID SCAR 11/29/2006  . FOLLICULITIS 42/68/3419    Starr Lake PT, DPT, LAT, ATC  04/05/17  5:40 PM      [place]  [place], [place], [address] Phone: [phone]   Fax:  [phone]  Name: Ivianna Notch MRN: [id number] Date of Birth: [birth year]

## 2017-04-06 ENCOUNTER — Ambulatory Visit (INDEPENDENT_AMBULATORY_CARE_PROVIDER_SITE_OTHER): Payer: BLUE CROSS/BLUE SHIELD | Admitting: Family Medicine

## 2017-04-06 ENCOUNTER — Encounter: Payer: Self-pay | Admitting: Family Medicine

## 2017-04-06 VITALS — BP 120/78 | HR 88 | Temp 98.8°F | Ht 66.5 in | Wt 260.0 lb

## 2017-04-06 DIAGNOSIS — Z0000 Encounter for general adult medical examination without abnormal findings: Secondary | ICD-10-CM | POA: Diagnosis not present

## 2017-04-06 DIAGNOSIS — R7303 Prediabetes: Secondary | ICD-10-CM

## 2017-04-06 DIAGNOSIS — L723 Sebaceous cyst: Secondary | ICD-10-CM | POA: Diagnosis not present

## 2017-04-06 DIAGNOSIS — R7989 Other specified abnormal findings of blood chemistry: Secondary | ICD-10-CM | POA: Diagnosis not present

## 2017-04-06 DIAGNOSIS — E282 Polycystic ovarian syndrome: Secondary | ICD-10-CM

## 2017-04-06 DIAGNOSIS — Z23 Encounter for immunization: Secondary | ICD-10-CM | POA: Diagnosis not present

## 2017-04-06 DIAGNOSIS — E119 Type 2 diabetes mellitus without complications: Secondary | ICD-10-CM | POA: Insufficient documentation

## 2017-04-06 DIAGNOSIS — I10 Essential (primary) hypertension: Secondary | ICD-10-CM | POA: Diagnosis not present

## 2017-04-06 MED ORDER — DICLOFENAC SODIUM 1 % TD GEL: 2.0000 g | Freq: Four times a day (QID) | TRANSDERMAL | 3 refills | Status: DC | PRN

## 2017-04-06 MED ORDER — SPIRONOLACTONE 50 MG PO TABS: ORAL_TABLET | ORAL | 11 refills | Status: DC

## 2017-04-06 NOTE — Patient Instructions (Signed)
Try to get most of your carbohydrates from produce (with the exception of white potatoes)  Eat less bread/pasta/rice/snack foods/cereals/sweets and other items from the middle of the grocery store (processed carbs) For cholesterol :   Avoid red meat/ fried foods/ egg yolks/ fatty breakfast meats/ butter, cheese and high fat dairy/ and shellfish   Keep working on healthy diet and exercise and weight   Tdap and flu vaccines today   Take care of yourself

## 2017-04-06 NOTE — Progress Notes (Signed)
Subjective:    Patient ID: Ashley Mercado, female    DOB: 1970-09-27, 46 y.o.   MRN: [id number]  HPI  Here for health maintenance exam and to review chronic medical problems    She started astelin nasal spray and claims it made her gain weight  Wt is up  Did the mediterranian diet and walking and still could not loose weight   She stopped it when she got up to 270 and then started loosing weight   Saw allergist and was started on dymista instead    Wt Readings from Last 3 Encounters:  04/06/17 260 lb (117.9 kg)  01/25/17 259 lb 8 oz (117.7 kg)  11/10/16 251 lb 4 oz (114 kg)  now eating better /loosing weight and walking  41.34 kg/m  Tetanus (Td) 08  Flu shot -will get today   Mammogram 9/17 nl - has it scheduled for the 9th  Self breast exam - no lumps   Pap 9/17 negative with neg HPV test  No new partners  Has h/o pcos  Lab Results  Component Value Date   HGBA1C 6.1 04/01/2017  already working on diet and weight loss  Family hist of DM -parents    Post menopausal-no menses  No periods has night hot flashes- not every day      bp is stable today  No cp or palpitations or headaches or edema  No side effects to medicines  BP Readings from Last 3 Encounters:  04/06/17 120/78  01/25/17 120/80  11/10/16 116/78     Labs: Results for orders placed or performed in visit on 04/01/17  CBC with Differential/Platelet  Result Value Ref Range   WBC 4.3 4.0 - 10.5 K/uL   RBC 4.91 3.87 - 5.11 Mil/uL   Hemoglobin 14.2 12.0 - 15.0 g/dL   HCT 43.2 36.0 - 46.0 %   MCV 88.0 78.0 - 100.0 fl   MCHC 33.0 30.0 - 36.0 g/dL   RDW 13.5 11.5 - 15.5 %   Platelets 458.0 (H) 150.0 - 400.0 K/uL   Neutrophils Relative % 52.2 43.0 - 77.0 %   Lymphocytes Relative 36.5 12.0 - 46.0 %   Monocytes Relative 8.9 3.0 - 12.0 %   Eosinophils Relative 1.6 0.0 - 5.0 %   Basophils Relative 0.8 0.0 - 3.0 %   Neutro Abs 2.3 1.4 - 7.7 K/uL   Lymphs Abs 1.6 0.7 - 4.0 K/uL   Monocytes Absolute  0.4 0.1 - 1.0 K/uL   Eosinophils Absolute 0.1 0.0 - 0.7 K/uL   Basophils Absolute 0.0 0.0 - 0.1 K/uL  Comprehensive metabolic panel  Result Value Ref Range   Sodium 136 135 - 145 mEq/L   Potassium 4.0 3.5 - 5.1 mEq/L   Chloride 100 96 - 112 mEq/L   CO2 31 19 - 32 mEq/L   Glucose, Bld 99 70 - 99 mg/dL   BUN 9 6 - 23 mg/dL   Creatinine, Ser 0.81 0.40 - 1.20 mg/dL   Total Bilirubin 0.4 0.2 - 1.2 mg/dL   Alkaline Phosphatase 76 39 - 117 U/L   AST 13 0 - 37 U/L   ALT 12 0 - 35 U/L   Total Protein 7.5 6.0 - 8.3 g/dL   Albumin 3.8 3.5 - 5.2 g/dL   Calcium 9.2 8.4 - 10.5 mg/dL   GFR 96.88 >60.00 mL/min  Hemoglobin A1c  Result Value Ref Range   Hgb A1c MFr Bld 6.1 4.6 - 6.5 %  Lipid panel  Result  Value Ref Range   Cholesterol 178 0 - 200 mg/dL   Triglycerides 68.0 0.0 - 149.0 mg/dL   HDL 41.40 >39.00 mg/dL   VLDL 13.6 0.0 - 40.0 mg/dL   LDL Cholesterol 123 (H) 0 - 99 mg/dL   Total CHOL/HDL Ratio 4    NonHDL 136.15   TSH  Result Value Ref Range   TSH 0.76 0.35 - 4.50 uIU/mL    No h/o blood clots No bleeding or bruising excessively  H/o elevated platelets   Cholesterol- the week of the test- bad diet and a lot of fat and baked goods  Want to keep an eye on this    Patient Active Problem List   Diagnosis Date Noted  . Prediabetes 04/06/2017  . Sebaceous cyst 04/06/2017  . Carpal tunnel syndrome 11/10/2016  . Allergic rhinitis 11/10/2016  . Encounter for routine gynecological examination 03/10/2016  . Screening for HIV (human immunodeficiency virus) 03/07/2015  . Essential hypertension 10/28/2014  . PCOS (polycystic ovarian syndrome) 10/28/2014  . Stress reaction 03/01/2014  . Amenorrhea 05/26/2012  . Other screening mammogram 02/18/2012  . Plantar fasciitis of left foot 02/18/2012  . Morbid obesity ([place]) 10/20/2011  . Elevated blood-pressure reading without diagnosis of hypertension 10/20/2011  . Routine general medical examination at a health care facility 02/06/2011    . Depression 11/04/2010  . HIDRADENITIS SUPPURATIVA 05/12/2010  . 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 Elevated platelet count 11/30/2006  . KELOID SCAR 11/29/2006  . FOLLICULITIS 56/31/4970   Past Medical History:  Diagnosis Date  . Folliculitis   . HSV infection   . Hydradenitis   . Hypertension   . Keloid scar   . Menorrhagia    No past surgical history on file. Social History  Substance Use Topics  . Smoking status: Never Smoker  . Smokeless tobacco: Never Used  . Alcohol use 0.0 oz/week     Comment: rare   Family History  Problem Relation Age of Onset  . Hypertension Father   . Diabetes Father   . Hypertension Mother   . Diabetes Mother   . Cancer Maternal Uncle        lymphoma  . Cancer Maternal Grandmother        ovarian   Allergies  Allergen Reactions  . Other     artificial sweeteners  . Sulfa Antibiotics Rash   Current Outpatient Prescriptions on File Prior to Visit  Medication Sig Dispense Refill  . DYMISTA 137-50 MCG/ACT SUSP     . EPIPEN 2-PAK 0.3 MG/0.3ML SOAJ injection     . levocetirizine (XYZAL) 5 MG tablet Take 2.5 mg by mouth.     . meloxicam (MOBIC) 15 MG tablet TAKE 1 TABLET (15 MG TOTAL) BY MOUTH DAILY AS NEEDED FOR PAIN. WITH A MEAL 30 tablet 3  . montelukast (SINGULAIR) 10 MG tablet     . Multiple Vitamins-Minerals (MULTIVITAMIN GUMMIES ADULT PO) Take 2 each by mouth daily.     No current facility-administered medications on file prior to visit.      Review of Systems  Constitutional: Negative for activity change, appetite change, fatigue, fever and unexpected weight change.  HENT: Negative for congestion, ear pain, rhinorrhea, sinus pressure and sore throat.   Eyes: Negative for pain, redness and visual disturbance.  Respiratory: Negative for cough, shortness of breath and wheezing.   Cardiovascular: Negative for chest pain and palpitations.  Gastrointestinal: Negative for abdominal pain, blood in stool, constipation and diarrhea.  Endocrine:  Negative for polydipsia and polyuria.  Genitourinary: Negative for dysuria, frequency and urgency.  Musculoskeletal: Negative for arthralgias, back pain and myalgias.  Skin: Negative for pallor and rash.  Allergic/Immunologic: Negative for environmental allergies.  Neurological: Negative for dizziness, syncope and headaches.  Hematological: Negative for adenopathy. Does not bruise/bleed easily.  Psychiatric/Behavioral: Negative for decreased concentration and dysphoric mood. The patient is not nervous/anxious.        Objective:   Physical Exam  Constitutional: She appears well-developed and well-nourished. No distress.  obese and well appearing   HENT:  Head: Normocephalic and atraumatic.  Right Ear: External ear normal.  Left Ear: External ear normal.  Mouth/Throat: Oropharynx is clear and moist.  Boggy nares   Eyes: Pupils are equal, round, and reactive to light. Conjunctivae and EOM are normal. No scleral icterus.  Neck: Normal range of motion. Neck supple. No JVD present. Carotid bruit is not present. No thyromegaly present.  Cardiovascular: Normal rate, regular rhythm, normal heart sounds and intact distal pulses.  Exam reveals no gallop.   Pulmonary/Chest: Effort normal and breath sounds normal. No respiratory distress. She has no wheezes. She exhibits no tenderness.  Abdominal: Soft. Bowel sounds are normal. She exhibits no distension, no abdominal bruit and no mass. There is no tenderness.  Genitourinary: No breast swelling, tenderness, discharge or bleeding.  Genitourinary Comments: Breast exam: No mass, nodules, thickening, tenderness, bulging, retraction, inflamation, nipple discharge or skin changes noted.  No axillary or clavicular LA.      Musculoskeletal: Normal range of motion. She exhibits no edema or tenderness.  Lymphadenopathy:    She has no cervical adenopathy.  Neurological: She is alert. She has normal reflexes. No cranial nerve deficit. She exhibits normal  muscle tone. Coordination normal.  Skin: Skin is warm and dry. No rash noted. No erythema. No pallor.  1 cm sebaceous cyst R upper back No redness nt  Directly under bra strap line   Psychiatric: She has a normal mood and affect.          Assessment & Plan:   Problem List Items Addressed This Visit      Cardiovascular and Mediastinum   Essential hypertension    bp in fair control at this time  BP Readings from Last 1 Encounters:  04/06/17 120/78   No changes needed Disc lifstyle change with low sodium diet and exercise  Labs reviewed  Wt loss enc      Relevant Medications   spironolactone (ALDACTONE) 50 MG tablet     Endocrine   PCOS (polycystic ovarian syndrome)    Lab Results  Component Value Date   HGBA1C 6.1 04/01/2017   Continue watching carbs in diet and working on wt loss Continue aldactone          Musculoskeletal and Integument   Sebaceous cyst    Bothersome - R upper back  Will ref to dermatology for removal       Relevant Orders   Ambulatory referral to Dermatology     Other   Elevated platelet count    Ongoing  Today 458 No bruising /bleeding or hx of clots Will continue to monitor       Morbid obesity ([place])    Discussed how this problem influences overall health and the risks it imposes  Reviewed plan for weight loss with lower calorie diet (via better food choices and also portion control or program like weight watchers) and exercise building up to or more than 30 minutes 5 days per week including some aerobic activity  Enc her to keep working on it  Per pt -wt gain listed as side eff of astelin ns       Prediabetes    Glucose intolerance as part of her PCOS Lab Results  Component Value Date   HGBA1C 6.1 04/01/2017   Doing well with diet and wt loss disc imp of low glycemic diet and wt loss to prevent DM2  Handouts given       Routine general medical examination at a health care facility - Primary    Reviewed health  habits including diet and exercise and skin cancer prevention Reviewed appropriate screening tests for age  Also reviewed health mt list, fam hx and immunization status , as well as social and family history   See HPI Flu and Tdap vaccines today  Ref to derm for seb cyst  Disc self care Labs reviewed  She will have mammogram on the 9th       Other Visit Diagnoses    Need for influenza vaccination       Relevant Orders   Flu Vaccine QUAD 36+ mos IM (Completed)   Need for Tdap vaccination       Relevant Orders   Tdap vaccine greater than or equal to 7yo IM (Completed)

## 2017-04-07 ENCOUNTER — Ambulatory Visit: Payer: BLUE CROSS/BLUE SHIELD | Admitting: Physical Therapy

## 2017-04-07 ENCOUNTER — Encounter: Payer: Self-pay | Admitting: Physical Therapy

## 2017-04-07 DIAGNOSIS — R293 Abnormal posture: Secondary | ICD-10-CM | POA: Diagnosis not present

## 2017-04-07 DIAGNOSIS — R208 Other disturbances of skin sensation: Secondary | ICD-10-CM | POA: Diagnosis not present

## 2017-04-07 DIAGNOSIS — M6281 Muscle weakness (generalized): Secondary | ICD-10-CM | POA: Diagnosis not present

## 2017-04-07 DIAGNOSIS — M542 Cervicalgia: Secondary | ICD-10-CM

## 2017-04-07 NOTE — Assessment & Plan Note (Signed)
Lab Results  Component Value Date   HGBA1C 6.1 04/01/2017   Continue watching carbs in diet and working on wt loss Continue aldactone

## 2017-04-07 NOTE — Assessment & Plan Note (Signed)
Glucose intolerance as part of her PCOS Lab Results  Component Value Date   HGBA1C 6.1 04/01/2017   Doing well with diet and wt loss disc imp of low glycemic diet and wt loss to prevent DM2  Handouts given

## 2017-04-07 NOTE — Assessment & Plan Note (Signed)
Discussed how this problem influences overall health and the risks it imposes  Reviewed plan for weight loss with lower calorie diet (via better food choices and also portion control or program like weight watchers) and exercise building up to or more than 30 minutes 5 days per week including some aerobic activity   Enc her to keep working on it  Per pt -wt gain listed as side eff of astelin ns

## 2017-04-07 NOTE — Therapy (Signed)
[place], [place], [address] Phone: [phone]   Fax:  [phone]  Physical Therapy Treatment  Patient Details  Name: Ashley Mercado MRN: [id number] Date of Birth: [birth year] Referring Provider: Alda Berthold, DO  Encounter Date: 04/07/2017      PT End of Session - 04/07/17 1632    Visit Number 5   Number of Visits 13   Date for PT Re-Evaluation 04/22/17   Authorization Type [employer]   PT Start Time 1632   PT Stop Time 1711   PT Time Calculation (min) 39 min   Activity Tolerance Patient tolerated treatment well   Behavior During Therapy [facility] for tasks assessed/performed      Past Medical History:  Diagnosis Date  . Folliculitis   . HSV infection   . Hydradenitis   . Hypertension   . Keloid scar   . Menorrhagia     History reviewed. No pertinent surgical history.  There were no vitals filed for this visit.      Subjective Assessment - 04/07/17 1632    Subjective pt reports soreness after DN but resolved N/T in UE. Has not been at work for a couple of days so she has not been on a computer.    Currently in Pain? No/denies                         [facility] Adult PT Treatment/Exercise - 04/07/17 0001      Shoulder Exercises: Supine   Horizontal ABduction 15 reps   Theraband Level (Shoulder Horizontal ABduction) Level 3 (Green)   External Rotation 20 reps   Theraband Level (Shoulder External Rotation) Level 3 (Green)   Other Supine Exercises lat pull down green tband, PT holding band overhead     Shoulder Exercises: ROM/Strengthening   UBE (Upper Arm Bike) retro 4 min L2     Shoulder Exercises: Stretch   Other Shoulder Stretches door stretch     Manual Therapy   Joint Mobilization prone thoracic & rib cage mobility, supine first rib mobs   Soft tissue mobilization bil rhomboids, upper traps, R scalenes   Manual Traction manual cervical traction with suboccipital release                   PT Short Term Goals - 03/29/17 1546      PT SHORT TERM GOAL #1   Title Pt will be able to utilize postural alignment changes to decrease N/T in hands   Baseline has been able since eval   Status Achieved           PT Long Term Goals - 03/09/17 1722      PT LONG TERM GOAL #1   Title GHJ ext rotation strength to 5/5 bilat to indicate appropraite strength to support posture   Baseline 4/5 bilat at eval   Time 6   Period Weeks   Status New   Target Date 04/22/17     PT LONG TERM GOAL #2   Title Pt will be able to lift/carry with R hand without losing grip strength   Baseline happens frequently at eval   Time 6   Period Weeks   Status New   Target Date 04/22/17     PT LONG TERM GOAL #3   Title pt will verbalize improvement in HA pain by utilizing postural alignment   Baseline frequent tension HA at eval   Time 6   Period  Weeks   Status New   Target Date 04/22/17     PT LONG TERM GOAL #4   Title FOTO to 35% limitation to indicate significant improvement in functional ability   Baseline 44% limitation at eval   Time 6   Period Weeks   Status New   Target Date 04/22/17               Plan - 04/07/17 1713    Clinical Impression Statement manual therapy to improve thoracic and rib cage mobility. Denies N/T over last few days but has not been on computer. Has obtained a standing desk and is going to try to move her monitors closer to her to avoid leaning fwd.    PT Treatment/Interventions ADLs/Self Care Home Management;Cryotherapy;Electrical Stimulation;Iontophoresis 4mg /ml Dexamethasone;Moist Heat;Traction;Ultrasound;Functional mobility training;Patient/family education;Therapeutic exercise;Therapeutic activities;Manual techniques;Passive range of motion;Dry needling;Taping   PT Next Visit Plan postural extensor endurance, DN prn, thoracic mobility   PT Home Exercise Plan scapular & cervical retraction, door pec stretch, upper trap &  levator stretch; prone I,Y,T, GHJ ER; prone superman   Consulted and Agree with Plan of Care Patient      Patient will benefit from skilled therapeutic intervention in order to improve the following deficits and impairments:  Impaired UE functional use, Increased muscle spasms, Decreased activity tolerance, Pain, Improper body mechanics, Impaired flexibility, Decreased strength, Postural dysfunction, Impaired sensation  Visit Diagnosis: Cervicalgia  Other disturbances of skin sensation  Muscle weakness (generalized)  Abnormal posture     Problem List Patient Active Problem List   Diagnosis Date Noted  . Prediabetes 04/06/2017  . Sebaceous cyst 04/06/2017  . Carpal tunnel syndrome 11/10/2016  . Allergic rhinitis 11/10/2016  . Encounter for routine gynecological examination 03/10/2016  . Screening for HIV (human immunodeficiency virus) 03/07/2015  . Essential hypertension 10/28/2014  . PCOS (polycystic ovarian syndrome) 10/28/2014  . Stress reaction 03/01/2014  . Amenorrhea 05/26/2012  . Other screening mammogram 02/18/2012  . Plantar fasciitis of left foot 02/18/2012  . Morbid obesity ([place]) 10/20/2011  . Elevated blood-pressure reading without diagnosis of hypertension 10/20/2011  . Routine general medical examination at a health care facility 02/06/2011  . Depression 11/04/2010  . HIDRADENITIS SUPPURATIVA 05/12/2010  . 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 Elevated platelet count 11/30/2006  . KELOID SCAR 11/29/2006  . FOLLICULITIS 17/51/0258    Ugochukwu Chichester C. Jaymarie Yeakel PT, DPT 04/07/17 5:22 PM   [facility] [address] [place], [place], [address] Phone: [phone]   Fax:  [phone]  Name: Ashley Mercado MRN: [id number] Date of Birth: 02/14/1971

## 2017-04-07 NOTE — Assessment & Plan Note (Addendum)
Reviewed health habits including diet and exercise and skin cancer prevention Reviewed appropriate screening tests for age  Also reviewed health mt list, fam hx and immunization status , as well as social and family history   See HPI Flu and Tdap vaccines today  Ref to derm for seb cyst  Disc self care Labs reviewed  She will have mammogram on the 9th

## 2017-04-07 NOTE — Assessment & Plan Note (Signed)
Ongoing  Today 458 No bruising /bleeding or hx of clots Will continue to monitor

## 2017-04-07 NOTE — Assessment & Plan Note (Signed)
bp in fair control at this time  BP Readings from Last 1 Encounters:  04/06/17 120/78   No changes needed Disc lifstyle change with low sodium diet and exercise  Labs reviewed  Wt loss enc

## 2017-04-07 NOTE — Assessment & Plan Note (Signed)
Bothersome - R upper back  Will ref to dermatology for removal

## 2017-04-08 DIAGNOSIS — J3089 Other allergic rhinitis: Secondary | ICD-10-CM | POA: Diagnosis not present

## 2017-04-11 ENCOUNTER — Encounter: Payer: Self-pay | Admitting: Family Medicine

## 2017-04-12 ENCOUNTER — Encounter: Payer: Self-pay | Admitting: Physical Therapy

## 2017-04-12 ENCOUNTER — Ambulatory Visit: Payer: BLUE CROSS/BLUE SHIELD | Admitting: Physical Therapy

## 2017-04-12 ENCOUNTER — Ambulatory Visit
Admission: RE | Admit: 2017-04-12 | Discharge: 2017-04-12 | Disposition: A | Discharge status: Home / Self Care | Payer: BLUE CROSS/BLUE SHIELD | Source: Ambulatory Visit | Attending: Family Medicine | Admitting: Family Medicine

## 2017-04-12 DIAGNOSIS — R208 Other disturbances of skin sensation: Secondary | ICD-10-CM

## 2017-04-12 DIAGNOSIS — R293 Abnormal posture: Secondary | ICD-10-CM | POA: Diagnosis not present

## 2017-04-12 DIAGNOSIS — Z1231 Encounter for screening mammogram for malignant neoplasm of breast: Secondary | ICD-10-CM | POA: Diagnosis not present

## 2017-04-12 DIAGNOSIS — M6281 Muscle weakness (generalized): Secondary | ICD-10-CM | POA: Diagnosis not present

## 2017-04-12 DIAGNOSIS — M542 Cervicalgia: Secondary | ICD-10-CM | POA: Diagnosis not present

## 2017-04-12 NOTE — Therapy (Addendum)
[place], [place], [address] Phone: [phone]   Fax:  [phone]  Physical Therapy Treatment/Discharge Summary  Patient Details  Name: Ahsha Hinsley MRN: [id number] Date of Birth: March 03, 1971 Referring Provider: Alda Berthold, DO  Encounter Date: 04/12/2017      PT End of Session - 04/12/17 1736    Visit Number 6   Number of Visits 13   Date for PT Re-Evaluation 04/22/17   Authorization Type BC[employer]   PT Start Time 1631   PT Stop Time 1716   PT Time Calculation (min) 45 min   Activity Tolerance Patient tolerated treatment well   Behavior During Therapy [facility] for tasks assessed/performed      Past Medical History:  Diagnosis Date  . Folliculitis   . HSV infection   . Hydradenitis   . Hypertension   . Keloid scar   . Menorrhagia     History reviewed. No pertinent surgical history.  There were no vitals filed for this visit.      Subjective Assessment - 04/12/17 1630    Subjective "Just alittle tingling in the thumb,    Currently in Pain? No/denies   Aggravating Factors  N/A                         [facility] Adult PT Treatment/Exercise - 04/12/17 1652      Neck Exercises: Supine   Neck Retraction 5 reps;10 secs  with head lift     Shoulder Exercises: Supine   Other Supine Exercises supine scapular series: horizontal abduction with flexion/ extension 1 x 10 with narrow grip, 1 x 10 with wide grip. scapular retraction with ER 1 x 10, D1/D2 1 x 12 ea   with green theraband     Shoulder Exercises: Sidelying   Other Sidelying Exercises book opening 2 x 10     Shoulder Exercises: Standing   Other Standing Exercises standing lower trap activation with in Y position with lift off 1 x 12  tactile cues to avoid upper trap activation     Manual Therapy   Joint Mobilization 1st rib mob grade 3 with pt deep breathing   self mobs added to HEP   Soft tissue mobilization IASTM  over the R scalene     Neck Exercises: Stretches   Upper Trapezius Stretch 2 reps;30 seconds          Trigger Point Dry Needling - 04/12/17 1734    Consent Given? Yes   Education Handout Provided Yes   Muscles Treated Upper Body --  scalenes on R                PT Short Term Goals - 03/29/17 1546      PT SHORT TERM GOAL #1   Title Pt will be able to utilize postural alignment changes to decrease N/T in hands   Baseline has been able since eval   Status Achieved           PT Long Term Goals - 03/09/17 1722      PT LONG TERM GOAL #1   Title GHJ ext rotation strength to 5/5 bilat to indicate appropraite strength to support posture   Baseline 4/5 bilat at eval   Time 6   Period Weeks   Status New   Target Date 04/22/17     PT LONG TERM GOAL #2   Title Pt will be able to lift/carry with R hand  without losing grip strength   Baseline happens frequently at eval   Time 6   Period Weeks   Status New   Target Date 04/22/17     PT LONG TERM GOAL #3   Title pt will verbalize improvement in HA pain by utilizing postural alignment   Baseline frequent tension HA at eval   Time 6   Period Weeks   Status New   Target Date 04/22/17     PT LONG TERM GOAL #4   Title FOTO to 35% limitation to indicate significant improvement in functional ability   Baseline 44% limitation at eval   Time 6   Period Weeks   Status New   Target Date 04/22/17               Plan - 04/12/17 1736    Clinical Impression Statement pt reported decreased pain in the neck and decreased incidence of HA. continued N/T in the thumb but very minimally. reports she has changed her desk set-up and overall has been doing well. DN today over R scalened and continued extensor strengthening, with pt reporting fatigue only.    PT Treatment/Interventions ADLs/Self Care Home Management;Cryotherapy;Electrical Stimulation;Iontophoresis 63m/ml Dexamethasone;Moist Heat;Traction;Ultrasound;Functional  mobility training;Patient/family education;Therapeutic exercise;Therapeutic activities;Manual techniques;Passive range of motion;Dry needling;Taping   PT Next Visit Plan postural extensor endurance, DN prn, thoracic mobility   PT Home Exercise Plan scapular & cervical retraction, door pec stretch, upper trap & levator stretch; prone I,Y,T, GHJ ER; prone superman   Consulted and Agree with Plan of Care Patient      Patient will benefit from skilled therapeutic intervention in order to improve the following deficits and impairments:  Impaired UE functional use, Increased muscle spasms, Decreased activity tolerance, Pain, Improper body mechanics, Impaired flexibility, Decreased strength, Postural dysfunction, Impaired sensation  Visit Diagnosis: Cervicalgia  Other disturbances of skin sensation  Muscle weakness (generalized)  Abnormal posture     Problem List Patient Active Problem List   Diagnosis Date Noted  . Prediabetes 04/06/2017  . Sebaceous cyst 04/06/2017  . Carpal tunnel syndrome 11/10/2016  . Allergic rhinitis 11/10/2016  . Encounter for routine gynecological examination 03/10/2016  . Screening for HIV (human immunodeficiency virus) 03/07/2015  . Essential hypertension 10/28/2014  . PCOS (polycystic ovarian syndrome) 10/28/2014  . Stress reaction 03/01/2014  . Amenorrhea 05/26/2012  . Other screening mammogram 02/18/2012  . Plantar fasciitis of left foot 02/18/2012  . [place] 10/20/2011  . Elevated blood-pressure reading without diagnosis of hypertension 10/20/2011  . Routine general medical examination at a health care facility 02/06/2011  . Depression 11/04/2010  . HIDRADENITIS SUPPURATIVA 05/12/2010  . 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 Elevated platelet count 11/30/2006  . KELOID SCAR 11/29/2006  . FOLLICULITIS 001/75/1025  KStarr LakePT, DPT, LAT, ATC  04/12/17  5:42 PM      [facility] [address]Phone: [phone]  Fax:  [phone] Name: NVrinda HeckstallMRN: [id number] of Birth: 410-01-1971 PHYSICAL THERAPY DISCHARGE SUMMARY  Visits from Start of Care: 6  Current functional level related to goals / functional outcomes: See above   Remaining deficits: See above   Education / Equipment: Anatomy of condition, POC, HEP, exercise form/rationale  Plan: Patient agrees to discharge.  Patient goals were not met. Patient is being discharged due to a change in medical status.  ?????    Pt called to cancel appointments due to having surgery. Will require new  referral to return to [place]. [place] PT, DPT 05/10/17 5:55 PM

## 2017-04-13 DIAGNOSIS — L731 Pseudofolliculitis barbae: Secondary | ICD-10-CM | POA: Diagnosis not present

## 2017-04-13 DIAGNOSIS — L02413 Cutaneous abscess of right upper limb: Secondary | ICD-10-CM | POA: Diagnosis not present

## 2017-04-13 DIAGNOSIS — L810 Postinflammatory hyperpigmentation: Secondary | ICD-10-CM | POA: Diagnosis not present

## 2017-04-14 ENCOUNTER — Encounter: Payer: Self-pay | Admitting: Physical Therapy

## 2017-04-15 DIAGNOSIS — J3089 Other allergic rhinitis: Secondary | ICD-10-CM | POA: Diagnosis not present

## 2017-04-19 ENCOUNTER — Encounter: Payer: Self-pay | Admitting: Physical Therapy

## 2017-04-20 DIAGNOSIS — L731 Pseudofolliculitis barbae: Secondary | ICD-10-CM | POA: Diagnosis not present

## 2017-04-20 DIAGNOSIS — L02212 Cutaneous abscess of back [any part, except buttock]: Secondary | ICD-10-CM | POA: Diagnosis not present

## 2017-04-20 DIAGNOSIS — Z22322 Carrier or suspected carrier of Methicillin resistant Staphylococcus aureus: Secondary | ICD-10-CM | POA: Diagnosis not present

## 2017-04-22 DIAGNOSIS — J3089 Other allergic rhinitis: Secondary | ICD-10-CM | POA: Diagnosis not present

## 2017-04-29 DIAGNOSIS — J3089 Other allergic rhinitis: Secondary | ICD-10-CM | POA: Diagnosis not present

## 2017-05-06 DIAGNOSIS — J3089 Other allergic rhinitis: Secondary | ICD-10-CM | POA: Diagnosis not present

## 2017-05-13 DIAGNOSIS — J3089 Other allergic rhinitis: Secondary | ICD-10-CM | POA: Diagnosis not present

## 2017-05-20 DIAGNOSIS — J3089 Other allergic rhinitis: Secondary | ICD-10-CM | POA: Diagnosis not present

## 2017-05-24 DIAGNOSIS — J3089 Other allergic rhinitis: Secondary | ICD-10-CM | POA: Diagnosis not present

## 2017-06-07 DIAGNOSIS — J3089 Other allergic rhinitis: Secondary | ICD-10-CM | POA: Diagnosis not present

## 2017-06-07 DIAGNOSIS — R05 Cough: Secondary | ICD-10-CM | POA: Diagnosis not present

## 2017-06-07 DIAGNOSIS — H1045 Other chronic allergic conjunctivitis: Secondary | ICD-10-CM | POA: Diagnosis not present

## 2017-06-10 DIAGNOSIS — J3089 Other allergic rhinitis: Secondary | ICD-10-CM | POA: Diagnosis not present

## 2017-06-30 ENCOUNTER — Telehealth: Payer: Self-pay | Admitting: *Deleted

## 2017-06-30 NOTE — Telephone Encounter (Signed)
Received another request to do PA for diclofenac gel, it was declined in May but I did submit the PA again at [email or web address], I will await a response, but in the past if it's denied again pt just has been paying out of pocket for med

## 2017-07-01 DIAGNOSIS — J3089 Other allergic rhinitis: Secondary | ICD-10-CM | POA: Diagnosis not present

## 2017-07-01 NOTE — Telephone Encounter (Addendum)
PA was denied, pt has just been paying out of pocket for this med, pharmacy notified and letter given to Dr. Glori Bickers to sign and send for scanning

## 2017-07-15 DIAGNOSIS — J3089 Other allergic rhinitis: Secondary | ICD-10-CM | POA: Diagnosis not present

## 2017-12-06 DIAGNOSIS — R05 Cough: Secondary | ICD-10-CM | POA: Diagnosis not present

## 2017-12-06 DIAGNOSIS — J3089 Other allergic rhinitis: Secondary | ICD-10-CM | POA: Diagnosis not present

## 2017-12-06 DIAGNOSIS — H1045 Other chronic allergic conjunctivitis: Secondary | ICD-10-CM | POA: Diagnosis not present

## 2017-12-06 DIAGNOSIS — K219 Gastro-esophageal reflux disease without esophagitis: Secondary | ICD-10-CM | POA: Diagnosis not present

## 2018-01-28 DIAGNOSIS — M65871 Other synovitis and tenosynovitis, right ankle and foot: Secondary | ICD-10-CM | POA: Diagnosis not present

## 2018-03-01 ENCOUNTER — Other Ambulatory Visit: Payer: Self-pay | Admitting: Sports Medicine

## 2018-03-01 ENCOUNTER — Ambulatory Visit (INDEPENDENT_AMBULATORY_CARE_PROVIDER_SITE_OTHER): Payer: BLUE CROSS/BLUE SHIELD | Admitting: Sports Medicine

## 2018-03-01 ENCOUNTER — Encounter: Payer: Self-pay | Admitting: Sports Medicine

## 2018-03-01 ENCOUNTER — Ambulatory Visit (INDEPENDENT_AMBULATORY_CARE_PROVIDER_SITE_OTHER): Payer: BLUE CROSS/BLUE SHIELD

## 2018-03-01 DIAGNOSIS — M76821 Posterior tibial tendinitis, right leg: Secondary | ICD-10-CM

## 2018-03-01 DIAGNOSIS — M79671 Pain in right foot: Secondary | ICD-10-CM

## 2018-03-01 NOTE — Progress Notes (Signed)
Subjective: Ashley Mercado is a 47 y.o. female patient who presents to office for evaluation of right foot pain. Patient complains of progressive pain especially over the last 2 months that is stabbing and achy in nature worse with walking standing and driving states that sometimes even when she sitting she can feel a light throb along the instep of her right foot.  Patient has tried ibuprofen, icing, gentle range of motion and Epson salt.  Patient reports that sometimes with certain motions the pain will increase.  Patient does not recall any type of injury or trauma to the area.  Ranks pain 8 out of 10.  Patient denies any other pedal complaints at this time.  Review of Systems  Musculoskeletal: Positive for joint pain.  All other systems reviewed and are negative.    Patient Active Problem List   Diagnosis Date Noted  . Prediabetes 04/06/2017  . Sebaceous cyst 04/06/2017  . Carpal tunnel syndrome 11/10/2016  . Allergic rhinitis 11/10/2016  . Encounter for routine gynecological examination 03/10/2016  . Screening for HIV (human immunodeficiency virus) 03/07/2015  . Essential hypertension 10/28/2014  . PCOS (polycystic ovarian syndrome) 10/28/2014  . Stress reaction 03/01/2014  . Amenorrhea 05/26/2012  . Other screening mammogram 02/18/2012  . Plantar fasciitis of left foot 02/18/2012  . Morbid obesity ([place]) 10/20/2011  . Elevated blood-pressure reading without diagnosis of hypertension 10/20/2011  . Routine general medical examination at a health care facility 02/06/2011  . Depression 11/04/2010  . HIDRADENITIS SUPPURATIVA 05/12/2010  . 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 Elevated platelet count 11/30/2006  . KELOID SCAR 11/29/2006  . FOLLICULITIS 24/40/1027    Current Outpatient Medications on File Prior to Visit  Medication Sig Dispense Refill  . diclofenac sodium (VOLTAREN) 1 % GEL Apply 2 g topically 4 (four) times daily as needed. To affected areas 1 Tube 3  . DYMISTA 137-50 MCG/ACT SUSP      . EPIPEN 2-PAK 0.3 MG/0.3ML SOAJ injection     . levocetirizine (XYZAL) 5 MG tablet Take 2.5 mg by mouth.     . montelukast (SINGULAIR) 10 MG tablet     . Multiple Vitamins-Minerals (MULTIVITAMIN GUMMIES ADULT PO) Take 2 each by mouth daily.    Marland Kitchen spironolactone (ALDACTONE) 50 MG tablet TAKE 1 TABLET (50 MG TOTAL) BY MOUTH DAILY. 30 tablet 11   No current facility-administered medications on file prior to visit.     Allergies  Allergen Reactions  . Other     artificial sweeteners  . Sulfa Antibiotics Rash    Objective:  General: Alert and oriented x3 in no acute distress  Dermatology: No open lesions bilateral lower extremities, no webspace macerations, no ecchymosis bilateral, all nails x 10 are well manicured.  Vascular: Dorsalis Pedis and Posterior Tibial pedal pulses palpable, Capillary Fill Time 3 seconds,(+) pedal hair growth bilateral, no edema bilateral lower extremities, Temperature gradient within normal limits.  Neurology: Johney Maine sensation intact via light touch bilateral.  Musculoskeletal: Mild tenderness with palpation at right foot along the posterior tibial tendon course t,No pain with calf compression bilateral. There is decreased ankle rom with knee extending  vs flexed resembling gastroc equnius bilateral, Subtalar joint range of motion is within normal limits, there is no 1st ray hypermobility noted bilateral, decreased 1st MPJ rom Right>Left with functional limitus noted on weightbearing exam with midtarsal collapse supportive of pes planus. Strength within normal limits in all groups bilateral.   Gait: Antalgic gait  Xrays  Right foot   Impression: Normal osseous mineralization,  there is significant midtarsal breech supportive of pes planus deformity.  No other acute findings.  Assessment and Plan: Problem List Items Addressed This Visit    None    Visit Diagnoses    Right foot pain    -  Primary   Relevant Orders   DG Foot Complete Right   Posterior  tibial tendinitis of right leg          -Complete examination performed -Xrays reviewed -Discussed treatement options for tendinitis -After oral consent and aseptic prep, injected a mixture containing 1 ml of 2%  plain lidocaine, 1 ml 0.5% plain marcaine, 0.5 ml of kenalog 10 and 0.5 ml of dexamethasone phosphate into R PT tendon course without complication. Post-injection care discussed with patient.  -Dispensed Tri-Lock brace and ice pack advised patient to continue with ibuprofen -Recommend good supportive shoes -Patient to return to office 3 weeks or sooner if condition worsens.  Landis Martins, DPM

## 2018-03-22 ENCOUNTER — Ambulatory Visit: Payer: BLUE CROSS/BLUE SHIELD | Admitting: Sports Medicine

## 2018-04-02 ENCOUNTER — Telehealth: Payer: Self-pay | Admitting: Family Medicine

## 2018-04-02 DIAGNOSIS — I10 Essential (primary) hypertension: Secondary | ICD-10-CM

## 2018-04-02 DIAGNOSIS — E282 Polycystic ovarian syndrome: Secondary | ICD-10-CM

## 2018-04-02 NOTE — Telephone Encounter (Signed)
-----   Message from Lendon Collar, RT sent at 03/27/2018 10:01 AM EDT ----- Regarding: Lab orders for Friday 04/07/18 Please enter CPE lab orders for 04/07/18. Thanks!

## 2018-04-05 ENCOUNTER — Encounter: Payer: Self-pay | Admitting: Sports Medicine

## 2018-04-05 ENCOUNTER — Ambulatory Visit (INDEPENDENT_AMBULATORY_CARE_PROVIDER_SITE_OTHER): Payer: BLUE CROSS/BLUE SHIELD | Admitting: Sports Medicine

## 2018-04-05 DIAGNOSIS — M79671 Pain in right foot: Secondary | ICD-10-CM | POA: Diagnosis not present

## 2018-04-05 DIAGNOSIS — M76821 Posterior tibial tendinitis, right leg: Secondary | ICD-10-CM | POA: Diagnosis not present

## 2018-04-05 DIAGNOSIS — M2141 Flat foot [pes planus] (acquired), right foot: Secondary | ICD-10-CM | POA: Diagnosis not present

## 2018-04-05 DIAGNOSIS — M2142 Flat foot [pes planus] (acquired), left foot: Secondary | ICD-10-CM

## 2018-04-05 MED ORDER — DICLOFENAC SODIUM 75 MG PO TBEC: 75.0000 mg | DELAYED_RELEASE_TABLET | Freq: Two times a day (BID) | ORAL | 0 refills | Status: DC

## 2018-04-05 NOTE — Progress Notes (Signed)
Subjective: Ashley Mercado is a 47 y.o. female patient who presents to office for follow-up evaluation of right foot and ankle pain patient reports that pain is better by 50% now 4 out of 10 not as bad as it was but still has some small aching to her foot especially throughout the day that gets worse at the end.  Patient states that she does not wear her Tri-Lock brace sometimes because it causes her foot to swell states that she has been consistent with ibuprofen icing and elevation.  Patient denies any other pedal complaints at this time.    Patient Active Problem List   Diagnosis Date Noted  . Prediabetes 04/06/2017  . Sebaceous cyst 04/06/2017  . Carpal tunnel syndrome 11/10/2016  . Allergic rhinitis 11/10/2016  . Encounter for routine gynecological examination 03/10/2016  . Screening for HIV (human immunodeficiency virus) 03/07/2015  . Essential hypertension 10/28/2014  . PCOS (polycystic ovarian syndrome) 10/28/2014  . Stress reaction 03/01/2014  . Amenorrhea 05/26/2012  . Other screening mammogram 02/18/2012  . Plantar fasciitis of left foot 02/18/2012  . Morbid obesity ([place]) 10/20/2011  . Elevated blood-pressure reading without diagnosis of hypertension 10/20/2011  . Routine general medical examination at a health care facility 02/06/2011  . Depression 11/04/2010  . HIDRADENITIS SUPPURATIVA 05/12/2010  . 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 Elevated platelet count 11/30/2006  . KELOID SCAR 11/29/2006  . FOLLICULITIS 88/50/2774    Current Outpatient Medications on File Prior to Visit  Medication Sig Dispense Refill  . diclofenac sodium (VOLTAREN) 1 % GEL Apply 2 g topically 4 (four) times daily as needed. To affected areas 1 Tube 3  . DYMISTA 137-50 MCG/ACT SUSP     . EPIPEN 2-PAK 0.3 MG/0.3ML SOAJ injection     . levocetirizine (XYZAL) 5 MG tablet Take 2.5 mg by mouth.     . montelukast (SINGULAIR) 10 MG tablet     . Multiple Vitamins-Minerals (MULTIVITAMIN GUMMIES ADULT PO) Take 2 each by  mouth daily.    Marland Kitchen spironolactone (ALDACTONE) 50 MG tablet TAKE 1 TABLET (50 MG TOTAL) BY MOUTH DAILY. 30 tablet 11   No current facility-administered medications on file prior to visit.     Allergies  Allergen Reactions  . Other     artificial sweeteners  . Sulfa Antibiotics Rash    Objective:  General: Alert and oriented x3 in no acute distress  Dermatology: No open lesions bilateral lower extremities, no webspace macerations, no ecchymosis bilateral, all nails x 10 are well manicured.  Vascular: Dorsalis Pedis and Posterior Tibial pedal pulses palpable, Capillary Fill Time 3 seconds,(+) pedal hair growth bilateral, no edema bilateral lower extremities, Temperature gradient within normal limits.  Neurology: Johney Maine sensation intact via light touch bilateral.  Musculoskeletal: Decreased tenderness with palpation at right foot along the posterior tibial tendon course,No pain with calf compression bilateral. There is decreased ankle rom with knee extending  vs flexed resembling gastroc equnius bilateral, Subtalar joint range of motion is within normal limits, there is no 1st ray hypermobility noted bilateral, decreased 1st MPJ rom Right>Left with functional limitus noted on weightbearing exam with midtarsal collapse supportive of pes planus. Strength within normal limits in all groups bilateral.   Assessment and Plan: Problem List Items Addressed This Visit    None    Visit Diagnoses    Posterior tibial tendinitis of right leg    -  Primary   Relevant Medications   diclofenac (VOLTAREN) 75 MG EC tablet   Right foot pain  Pes planus of both feet          -Complete examination performed -Discussed continued care for tendinitis and pes planus -Prescribed Voltaren to take by mouth until completed -Advised patient to continue with supportive shoes and bracing and may need to adjust bracing if she feels like there is swelling going on underneath the brace -Advised patient to  continue with icing and elevating and fluid long-term patient may benefit from custom molded foot orthotics versus AFO -Patient to return to office 6 weeks to discuss orthotics if pain is better or sooner if condition worsens.  Landis Martins, DPM

## 2018-04-07 ENCOUNTER — Other Ambulatory Visit (INDEPENDENT_AMBULATORY_CARE_PROVIDER_SITE_OTHER): Payer: BLUE CROSS/BLUE SHIELD

## 2018-04-07 DIAGNOSIS — E282 Polycystic ovarian syndrome: Secondary | ICD-10-CM

## 2018-04-07 DIAGNOSIS — I10 Essential (primary) hypertension: Secondary | ICD-10-CM | POA: Diagnosis not present

## 2018-04-07 LAB — COMPREHENSIVE METABOLIC PANEL
ALBUMIN: 3.9 g/dL (ref 3.5–5.2)
ALT: 12 U/L (ref 0–35)
AST: 14 U/L (ref 0–37)
Alkaline Phosphatase: 83 U/L (ref 39–117)
BUN: 11 mg/dL (ref 6–23)
CHLORIDE: 103 meq/L (ref 96–112)
CO2: 30 mEq/L (ref 19–32)
Calcium: 9.3 mg/dL (ref 8.4–10.5)
Creatinine, Ser: 0.85 mg/dL (ref 0.40–1.20)
GFR: 91.25 mL/min (ref >60.00)
GLUCOSE: 92 mg/dL (ref 70–99)
Potassium: 4.2 mEq/L (ref 3.5–5.1)
SODIUM: 140 meq/L (ref 135–145)
TOTAL PROTEIN: 7.8 g/dL (ref 6.0–8.3)
Total Bilirubin: 0.4 mg/dL (ref 0.2–1.2)

## 2018-04-07 LAB — LIPID PANEL
CHOLESTEROL: 157 mg/dL (ref 0–200)
HDL: 45.1 mg/dL (ref >39.00)
LDL Cholesterol: 101 mg/dL — ABNORMAL HIGH (ref 0–99)
NonHDL: 112.17
TRIGLYCERIDES: 57 mg/dL (ref 0.0–149.0)
Total CHOL/HDL Ratio: 3
VLDL: 11.4 mg/dL (ref 0.0–40.0)

## 2018-04-07 LAB — CBC WITH DIFFERENTIAL/PLATELET
Basophils Absolute: 0 10*3/uL (ref 0.0–0.1)
Basophils Relative: 0.7 % (ref 0.0–3.0)
Eosinophils Absolute: 0 10*3/uL (ref 0.0–0.7)
Eosinophils Relative: 0.4 % (ref 0.0–5.0)
HCT: 42.4 % (ref 36.0–46.0)
Hemoglobin: 14.1 g/dL (ref 12.0–15.0)
Lymphocytes Relative: 33.4 % (ref 12.0–46.0)
Lymphs Abs: 1.8 10*3/uL (ref 0.7–4.0)
MCHC: 33.4 g/dL (ref 30.0–36.0)
MCV: 88.2 fl (ref 78.0–100.0)
Monocytes Absolute: 0.4 10*3/uL (ref 0.1–1.0)
Monocytes Relative: 6.7 % (ref 3.0–12.0)
Neutro Abs: 3.2 10*3/uL (ref 1.4–7.7)
Neutrophils Relative %: 58.8 % (ref 43.0–77.0)
Platelets: 460 10*3/uL — ABNORMAL HIGH (ref 150.0–400.0)
RBC: 4.8 Mil/uL (ref 3.87–5.11)
RDW: 14 % (ref 11.5–15.5)
WBC: 5.5 10*3/uL (ref 4.0–10.5)

## 2018-04-07 LAB — HEMOGLOBIN A1C: Hgb A1c MFr Bld: 5.9 % (ref 4.6–6.5)

## 2018-04-07 LAB — TSH: TSH: 1.1 u[IU]/mL (ref 0.35–4.50)

## 2018-04-12 ENCOUNTER — Encounter: Payer: Self-pay | Admitting: Family Medicine

## 2018-04-12 ENCOUNTER — Ambulatory Visit (INDEPENDENT_AMBULATORY_CARE_PROVIDER_SITE_OTHER): Payer: BLUE CROSS/BLUE SHIELD | Admitting: Family Medicine

## 2018-04-12 VITALS — BP 126/78 | HR 87 | Temp 98.5°F | Ht 66.5 in | Wt 251.5 lb

## 2018-04-12 DIAGNOSIS — R7989 Other specified abnormal findings of blood chemistry: Secondary | ICD-10-CM | POA: Diagnosis not present

## 2018-04-12 DIAGNOSIS — E282 Polycystic ovarian syndrome: Secondary | ICD-10-CM | POA: Diagnosis not present

## 2018-04-12 DIAGNOSIS — Z0000 Encounter for general adult medical examination without abnormal findings: Secondary | ICD-10-CM

## 2018-04-12 DIAGNOSIS — I10 Essential (primary) hypertension: Secondary | ICD-10-CM | POA: Diagnosis not present

## 2018-04-12 DIAGNOSIS — Z23 Encounter for immunization: Secondary | ICD-10-CM

## 2018-04-12 DIAGNOSIS — R300 Dysuria: Secondary | ICD-10-CM

## 2018-04-12 DIAGNOSIS — R7303 Prediabetes: Secondary | ICD-10-CM

## 2018-04-12 LAB — POC URINALSYSI DIPSTICK (AUTOMATED)
Bilirubin, UA: NEGATIVE
GLUCOSE UA: NEGATIVE
Ketones, UA: NEGATIVE
NITRITE UA: NEGATIVE
PH UA: 6 (ref 5.0–8.0)
Protein, UA: NEGATIVE
SPEC GRAV UA: 1.025 (ref 1.010–1.025)
UROBILINOGEN UA: 0.2 U/dL

## 2018-04-12 MED ORDER — CEPHALEXIN 250 MG PO CAPS: 250.0000 mg | ORAL_CAPSULE | Freq: Two times a day (BID) | ORAL | 0 refills | Status: DC

## 2018-04-12 MED ORDER — SPIRONOLACTONE 50 MG PO TABS: ORAL_TABLET | ORAL | 11 refills | Status: DC

## 2018-04-12 NOTE — Assessment & Plan Note (Signed)
In the setting of PCOS Commended wt loss Lab Results  Component Value Date   HGBA1C 5.9 04/07/2018   Improved  disc imp of low glycemic diet and wt loss to prevent DM2

## 2018-04-12 NOTE — Assessment & Plan Note (Signed)
bp in fair control at this time  BP Readings from Last 1 Encounters:  04/12/18 126/78   No changes needed Most recent labs reviewed  Disc lifstyle change with low sodium diet and exercise

## 2018-04-12 NOTE — Assessment & Plan Note (Signed)
Stable  No bleeding or bruising Continue to monitor

## 2018-04-12 NOTE — Assessment & Plan Note (Signed)
Mildly pos UA with uti symptoms  inst to inc water  Px keflex 250 bid  Pending culture Disc ways to prevent utis Update if not starting to improve in a week or if worsening

## 2018-04-12 NOTE — Assessment & Plan Note (Signed)
Lab Results  Component Value Date   HGBA1C 5.9 04/07/2018   disc imp of low glycemic diet and wt loss to prevent DM2  Continue aldactone-doing well

## 2018-04-12 NOTE — Progress Notes (Signed)
Subjective:    Patient ID: Ashley Mercado, female    DOB: 1971-01-02, 47 y.o.   MRN: [id number]  HPI Here for health maintenance exam and to review chronic medical problems    Also had urinary symptoms Started a few weeks ago  Tried cranberry juice-improved a bit  Burning or frequency  No vaginal d/c  No blood in urine but had some odor  Not drinking enough water   Trace bld /leuk on ua  Results for orders placed or performed in visit on 04/12/18  POCT Urinalysis Dipstick (Automated)  Result Value Ref Range   Color, UA Light Yellow    Clarity, UA Clear    Glucose, UA Negative Negative   Bilirubin, UA Negative    Ketones, UA Negative    Spec Grav, UA 1.025 1.010 - 1.025   Blood, UA Trace    pH, UA 6.0 5.0 - 8.0   Protein, UA Negative Negative   Urobilinogen, UA 0.2 0.2 or 1.0 E.U./dL   Nitrite, UA Negative    Leukocytes, UA Trace (A) Negative     Got married - and cooks more at home  Is moving - will have somewhere to walk more   Wt Readings from Last 3 Encounters:  04/12/18 251 lb 8 oz (114.1 kg)  04/06/17 260 lb (117.9 kg)  01/25/17 259 lb 8 oz (117.7 kg)  working on weight loss  39.99 kg/m   Flu vaccine -wants today   Mammogram 10/18- has not scheduled it -she will do it  Self breast exam -no lumps   Pap 9/17 -neg with neg HPV screen  No gyn symptoms   Tetanus shot 9/17   Blood pressure BP Readings from Last 3 Encounters:  04/12/18 126/78  04/06/17 120/78  01/25/17 120/80   Pulse Readings from Last 3 Encounters:  04/12/18 87  04/06/17 88  01/25/17 91    Hx of elevated platelets Lab Results  Component Value Date   WBC 5.5 04/07/2018   HGB 14.1 04/07/2018   HCT 42.4 04/07/2018   MCV 88.2 04/07/2018   PLT 460.0 (H) 04/07/2018   no excessive bleeding or bruising   Cholesterol  Lab Results  Component Value Date   CHOL 157 04/07/2018   CHOL 178 04/01/2017   CHOL 182 03/05/2016   Lab Results  Component Value Date   HDL 45.10 04/07/2018    HDL 41.40 04/01/2017   HDL 52.70 03/05/2016   Lab Results  Component Value Date   LDLCALC 101 (H) 04/07/2018   LDLCALC 123 (H) 04/01/2017   LDLCALC 116 (H) 03/05/2016   Lab Results  Component Value Date   TRIG 57.0 04/07/2018   TRIG 68.0 04/01/2017   TRIG 64.0 03/05/2016   Lab Results  Component Value Date   CHOLHDL 3 04/07/2018   CHOLHDL 4 04/01/2017   CHOLHDL 3 03/05/2016   No results found for: LDLDIRECT  Improved LDL at 101 with better eating   Prediabetes  Lab Results  Component Value Date   HGBA1C 5.9 04/07/2018  this is down also   Patient Active Problem List   Diagnosis Date Noted  . Dysuria 04/12/2018  . Prediabetes 04/06/2017  . Sebaceous cyst 04/06/2017  . Carpal tunnel syndrome 11/10/2016  . Allergic rhinitis 11/10/2016  . Encounter for routine gynecological examination 03/10/2016  . Screening for HIV (human immunodeficiency virus) 03/07/2015  . Essential hypertension 10/28/2014  . PCOS (polycystic ovarian syndrome) 10/28/2014  . Stress reaction 03/01/2014  . Amenorrhea 05/26/2012  . Other  screening mammogram 02/18/2012  . Plantar fasciitis of left foot 02/18/2012  . Obesity (BMI 30-39.9) 10/20/2011  . Elevated blood-pressure reading without diagnosis of hypertension 10/20/2011  . Routine general medical examination at a health care facility 02/06/2011  . Depression 11/04/2010  . HIDRADENITIS SUPPURATIVA 05/12/2010  . 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 Elevated platelet count 11/30/2006  . KELOID SCAR 11/29/2006  . FOLLICULITIS 09/73/5329   Past Medical History:  Diagnosis Date  . Folliculitis   . HSV infection   . Hydradenitis   . Hypertension   . Keloid scar   . Menorrhagia    History reviewed. No pertinent surgical history. Social History   Tobacco Use  . Smoking status: Never Smoker  . Smokeless tobacco: Never Used  Substance Use Topics  . Alcohol use: Yes    Alcohol/week: 0.0 standard drinks    Comment: rare  . Drug use: No   Family  History  Problem Relation Age of Onset  . Hypertension Father   . Diabetes Father   . Hypertension Mother   . Diabetes Mother   . Cancer Maternal Uncle        lymphoma  . Cancer Maternal Grandmother        ovarian   Allergies  Allergen Reactions  . Other     artificial sweeteners  . Sulfa Antibiotics Rash   Current Outpatient Medications on File Prior to Visit  Medication Sig Dispense Refill  . diclofenac (VOLTAREN) 75 MG EC tablet Take 1 tablet (75 mg total) by mouth 2 (two) times daily. 30 tablet 0  . diclofenac sodium (VOLTAREN) 1 % GEL Apply 2 g topically 4 (four) times daily as needed. To affected areas 1 Tube 3  . DYMISTA 137-50 MCG/ACT SUSP     . EPIPEN 2-PAK 0.3 MG/0.3ML SOAJ injection     . levocetirizine (XYZAL) 5 MG tablet Take 2.5 mg by mouth.     . montelukast (SINGULAIR) 10 MG tablet     . Multiple Vitamins-Minerals (MULTIVITAMIN GUMMIES ADULT PO) Take 2 each by mouth daily.     No current facility-administered medications on file prior to visit.     Review of Systems  Constitutional: Positive for fatigue. Negative for activity change, appetite change and fever.  HENT: Negative for congestion and sore throat.   Eyes: Negative for itching and visual disturbance.  Respiratory: Negative for cough and shortness of breath.   Cardiovascular: Negative for leg swelling.  Gastrointestinal: Negative for abdominal distention, abdominal pain, constipation, diarrhea and nausea.  Endocrine: Negative for cold intolerance and polydipsia.  Genitourinary: Positive for dysuria, frequency and urgency. Negative for difficulty urinating, flank pain and hematuria.  Musculoskeletal: Negative for myalgias.  Skin: Negative for rash.  Allergic/Immunologic: Negative for immunocompromised state.  Neurological: Negative for dizziness and weakness.  Hematological: Negative for adenopathy.       Objective:   Physical Exam  Constitutional: She appears well-developed and well-nourished.  No distress.  obese and well appearing   HENT:  Head: Normocephalic and atraumatic.  Right Ear: External ear normal.  Left Ear: External ear normal.  Mouth/Throat: Oropharynx is clear and moist.  Eyes: Pupils are equal, round, and reactive to light. Conjunctivae and EOM are normal. No scleral icterus.  Neck: Normal range of motion. Neck supple. No JVD present. Carotid bruit is not present. No thyromegaly present.  Cardiovascular: Normal rate, regular rhythm, normal heart sounds and intact distal pulses. Exam reveals no gallop.  Pulmonary/Chest: Effort normal and breath sounds normal. No respiratory distress. She  has no wheezes. She has no rales. She exhibits no tenderness. No breast tenderness, discharge or bleeding.  Abdominal: Soft. Bowel sounds are normal. She exhibits no distension, no abdominal bruit and no mass. There is no tenderness.  Genitourinary: No breast tenderness, discharge or bleeding.  Genitourinary Comments: Breast exam: No mass, nodules, thickening, tenderness, bulging, retraction, inflamation, nipple discharge or skin changes noted.  No axillary or clavicular LA.      Musculoskeletal: Normal range of motion. She exhibits no edema or tenderness.  Lymphadenopathy:    She has no cervical adenopathy.  Neurological: She is alert. She has normal reflexes. She displays normal reflexes. No cranial nerve deficit. She exhibits normal muscle tone. Coordination normal.  Skin: Skin is warm and dry. No rash noted. No erythema. No pallor.  Several scattered keloid scars  No cysts today  Psychiatric: She has a normal mood and affect.  Pleasant           Assessment & Plan:   Problem List Items Addressed This Visit      Cardiovascular and Mediastinum   Essential hypertension    bp in fair control at this time  BP Readings from Last 1 Encounters:  04/12/18 126/78   No changes needed Most recent labs reviewed  Disc lifstyle change with low sodium diet and exercise         Relevant Medications   spironolactone (ALDACTONE) 50 MG tablet     Endocrine   PCOS (polycystic ovarian syndrome)    Lab Results  Component Value Date   HGBA1C 5.9 04/07/2018   disc imp of low glycemic diet and wt loss to prevent DM2  Continue aldactone-doing well         Other   Dysuria    Mildly pos UA with uti symptoms  inst to inc water  Px keflex 250 bid  Pending culture Disc ways to prevent utis Update if not starting to improve in a week or if worsening        Relevant Orders   POCT Urinalysis Dipstick (Automated) (Completed)   Urine Culture   Elevated platelet count    Stable  No bleeding or bruising Continue to monitor      Prediabetes    In the setting of PCOS Commended wt loss Lab Results  Component Value Date   HGBA1C 5.9 04/07/2018   Improved  disc imp of low glycemic diet and wt loss to prevent DM2       Routine general medical examination at a health care facility - Primary    Reviewed health habits including diet and exercise and skin cancer prevention Reviewed appropriate screening tests for age  Also reviewed health mt list, fam hx and immunization status , as well as social and family history   See HPI Labs reviewed  Improved glucose control and cholesterol  Enc further wt loss  Flu vaccine today  Pt plans to schedule mammogram this month        Other Visit Diagnoses    Need for influenza vaccination       Relevant Orders   Flu Vaccine QUAD 6+ mos PF IM (Fluarix Quad PF) (Completed)

## 2018-04-12 NOTE — Patient Instructions (Addendum)
Don't forget to schedule your mammogram   Flu shot today   Aim for 64 oz of fluid daily /mostly water  Take the keflex as directed for suspected uti Update if not starting to improve in a week or if worsening   We will contact you with a urine culture result when it returns

## 2018-04-12 NOTE — Assessment & Plan Note (Signed)
Reviewed health habits including diet and exercise and skin cancer prevention Reviewed appropriate screening tests for age  Also reviewed health mt list, fam hx and immunization status , as well as social and family history   See HPI Labs reviewed  Improved glucose control and cholesterol  Enc further wt loss  Flu vaccine today  Pt plans to schedule mammogram this month

## 2018-04-14 ENCOUNTER — Other Ambulatory Visit: Payer: Self-pay | Admitting: Family Medicine

## 2018-04-14 DIAGNOSIS — Z1231 Encounter for screening mammogram for malignant neoplasm of breast: Secondary | ICD-10-CM

## 2018-04-14 LAB — URINE CULTURE
MICRO NUMBER:: 91214558
SPECIMEN QUALITY:: ADEQUATE

## 2018-05-17 ENCOUNTER — Ambulatory Visit (INDEPENDENT_AMBULATORY_CARE_PROVIDER_SITE_OTHER): Payer: BLUE CROSS/BLUE SHIELD | Admitting: Sports Medicine

## 2018-05-17 ENCOUNTER — Telehealth: Payer: Self-pay | Admitting: *Deleted

## 2018-05-17 ENCOUNTER — Encounter: Payer: Self-pay | Admitting: Sports Medicine

## 2018-05-17 DIAGNOSIS — T148XXA Other injury of unspecified body region, initial encounter: Secondary | ICD-10-CM

## 2018-05-17 DIAGNOSIS — M2141 Flat foot [pes planus] (acquired), right foot: Secondary | ICD-10-CM

## 2018-05-17 DIAGNOSIS — M2142 Flat foot [pes planus] (acquired), left foot: Secondary | ICD-10-CM

## 2018-05-17 DIAGNOSIS — M76821 Posterior tibial tendinitis, right leg: Secondary | ICD-10-CM | POA: Diagnosis not present

## 2018-05-17 DIAGNOSIS — M79671 Pain in right foot: Secondary | ICD-10-CM | POA: Diagnosis not present

## 2018-05-17 MED ORDER — DICLOFENAC SODIUM 75 MG PO TBEC: 75.0000 mg | DELAYED_RELEASE_TABLET | Freq: Two times a day (BID) | ORAL | 0 refills | Status: DC

## 2018-05-17 NOTE — Progress Notes (Signed)
Subjective: Ashley Mercado is a 47 y.o. female patient who returns to office for follow-up evaluation of right foot and ankle pain patient reports that pain is worse states that the diclofenac medication help the pain but when she completed that the pain returned and feels like it is going of her leg and feels like it is pulling with sometimes spasming in the calf states that the pain is sharp 7 out of 10 and states that by the end of the day she has to remove her brace because of the swelling that she is experiencing in her foot and ankle states that she has noticed that she is walking with a limp because of the pain and is unable to do some of her daily activities because of the discomfort.  Patient denies any other pedal complaints at this time.   Patient Active Problem List   Diagnosis Date Noted  . Dysuria 04/12/2018  . Prediabetes 04/06/2017  . Sebaceous cyst 04/06/2017  . Carpal tunnel syndrome 11/10/2016  . Allergic rhinitis 11/10/2016  . Encounter for routine gynecological examination 03/10/2016  . Screening for HIV (human immunodeficiency virus) 03/07/2015  . Essential hypertension 10/28/2014  . PCOS (polycystic ovarian syndrome) 10/28/2014  . Stress reaction 03/01/2014  . Other screening mammogram 02/18/2012  . Plantar fasciitis of left foot 02/18/2012  . Obesity (BMI 30-39.9) 10/20/2011  . Elevated blood-pressure reading without diagnosis of hypertension 10/20/2011  . Routine general medical examination at a health care facility 02/06/2011  . Depression 11/04/2010  . HIDRADENITIS SUPPURATIVA 05/12/2010  . 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 Elevated platelet count 11/30/2006  . KELOID SCAR 11/29/2006  . FOLLICULITIS 20/94/7096    Current Outpatient Medications on File Prior to Visit  Medication Sig Dispense Refill  . cephALEXin (KEFLEX) 250 MG capsule Take 1 capsule (250 mg total) by mouth 2 (two) times daily. 14 capsule 0  . diclofenac (VOLTAREN) 75 MG EC tablet Take 1 tablet (75 mg total)  by mouth 2 (two) times daily. 30 tablet 0  . diclofenac sodium (VOLTAREN) 1 % GEL Apply 2 g topically 4 (four) times daily as needed. To affected areas 1 Tube 3  . DYMISTA 137-50 MCG/ACT SUSP     . EPIPEN 2-PAK 0.3 MG/0.3ML SOAJ injection     . levocetirizine (XYZAL) 5 MG tablet Take 2.5 mg by mouth.     . montelukast (SINGULAIR) 10 MG tablet     . Multiple Vitamins-Minerals (MULTIVITAMIN GUMMIES ADULT PO) Take 2 each by mouth daily.    Marland Kitchen spironolactone (ALDACTONE) 50 MG tablet TAKE 1 TABLET (50 MG TOTAL) BY MOUTH DAILY. 30 tablet 11   No current facility-administered medications on file prior to visit.     Allergies  Allergen Reactions  . Other     artificial sweeteners  . Sulfa Antibiotics Rash    Objective:  General: Alert and oriented x3 in no acute distress  Dermatology: No open lesions bilateral lower extremities, no webspace macerations, no ecchymosis bilateral, all nails x 10 are well manicured.  Vascular: Dorsalis Pedis and Posterior Tibial pedal pulses palpable, Capillary Fill Time 3 seconds,(+) pedal hair growth bilateral, no edema bilateral lower extremities, Temperature gradient within normal limits.  No frank edema noted bilateral.  Neurology: Gross sensation intact via light touch bilateral.  Musculoskeletal: There is tenderness with palpation at right foot along the posterior tibial tendon course,No pain with calf compression bilateral. There is decreased ankle rom with knee extending  vs flexed resembling gastroc equnius bilateral, Subtalar joint range of  motion is within normal limits, there pes planus foot type bilateral. Strength within normal limits in all groups bilateral.   Assessment and Plan: Problem List Items Addressed This Visit    None    Visit Diagnoses    Posterior tibial tendinitis of right leg    -  Primary   Right foot pain       Pes planus of both feet          -Complete examination performed -Discussed continued care for tendinitis and pes  planus with now possible partial posterior tibial tendon tear in the setting of worsening pain -Dispense cam boot for patient to wear at any time she is attempting to ambulate -Ordered MRI for further evaluation of the posterior tibial tendon -Refilled diclofenac  -Recommend rest ice elevation protection and topical pain creams or rubs as needed -Patient to return to office after MRI or sooner if condition worsens.  Landis Martins, DPM

## 2018-05-17 NOTE — Telephone Encounter (Signed)
-----   Message from Ashley Mercado, [place] sent at 05/17/2018  8:29 AM EST ----- Regarding: MRI right ankle and foot Evaluate posterior tibial tendon pain that has worsened over the last month sharp in nature pulling going up the leg concerning for tear of the posterior tibial tendon

## 2018-05-17 NOTE — Telephone Encounter (Signed)
Orders to J. Quintana, RN for pre-cert. 

## 2018-05-23 NOTE — Telephone Encounter (Signed)
[facility] scheduled pt for MRI right ankle [id number] on 05/29/2018 arrive 5:15pm for 5:30pm imaging. Faxed order to [facility].

## 2018-05-23 NOTE — Telephone Encounter (Signed)
Left message informing pt of [facility] appt 05/29/2018.

## 2018-05-24 NOTE — Telephone Encounter (Signed)
I called pt and she states she was able to finally get through on the [phone] scheduling and she was able to get an appt in [place] for 05/27/2018 at [facility].

## 2018-05-24 NOTE — Addendum Note (Signed)
Addended by: Harriett Sine D on: 05/24/2018 11:07 AM   Modules accepted: Orders

## 2018-05-24 NOTE — Telephone Encounter (Signed)
Pt called for the scheduling number to [facility].

## 2018-05-24 NOTE — Telephone Encounter (Signed)
Pt states she will be on vacation the week of 05/29/2018, and would like to have the MRI in [place].

## 2018-05-24 NOTE — Telephone Encounter (Signed)
Faxed orders to [facility]. Faxed Cancellation to [facility].

## 2018-05-27 ENCOUNTER — Ambulatory Visit (HOSPITAL_COMMUNITY)
Admission: RE | Admit: 2018-05-27 | Discharge: 2018-05-27 | Disposition: A | Discharge status: Home / Self Care | Payer: BLUE CROSS/BLUE SHIELD | Source: Ambulatory Visit | Attending: Sports Medicine | Admitting: Sports Medicine

## 2018-05-27 DIAGNOSIS — S96811A Strain of other specified muscles and tendons at ankle and foot level, right foot, initial encounter: Secondary | ICD-10-CM | POA: Insufficient documentation

## 2018-05-27 DIAGNOSIS — M79671 Pain in right foot: Secondary | ICD-10-CM

## 2018-05-27 DIAGNOSIS — M76821 Posterior tibial tendinitis, right leg: Secondary | ICD-10-CM | POA: Diagnosis not present

## 2018-05-27 DIAGNOSIS — T148XXA Other injury of unspecified body region, initial encounter: Secondary | ICD-10-CM

## 2018-05-27 DIAGNOSIS — M2142 Flat foot [pes planus] (acquired), left foot: Secondary | ICD-10-CM

## 2018-05-27 DIAGNOSIS — M659 Synovitis and tenosynovitis, unspecified: Secondary | ICD-10-CM | POA: Insufficient documentation

## 2018-05-27 DIAGNOSIS — X58XXXA Exposure to other specified factors, initial encounter: Secondary | ICD-10-CM | POA: Insufficient documentation

## 2018-05-27 DIAGNOSIS — M2141 Flat foot [pes planus] (acquired), right foot: Secondary | ICD-10-CM | POA: Diagnosis not present

## 2018-05-27 DIAGNOSIS — M19071 Primary osteoarthritis, right ankle and foot: Secondary | ICD-10-CM | POA: Diagnosis not present

## 2018-05-29 ENCOUNTER — Telehealth: Payer: Self-pay | Admitting: *Deleted

## 2018-05-29 ENCOUNTER — Ambulatory Visit
Admission: RE | Admit: 2018-05-29 | Discharge: 2018-05-29 | Disposition: A | Discharge status: Home / Self Care | Payer: BLUE CROSS/BLUE SHIELD | Source: Ambulatory Visit | Attending: Family Medicine | Admitting: Family Medicine

## 2018-05-29 DIAGNOSIS — M76821 Posterior tibial tendinitis, right leg: Secondary | ICD-10-CM

## 2018-05-29 DIAGNOSIS — Z1231 Encounter for screening mammogram for malignant neoplasm of breast: Secondary | ICD-10-CM | POA: Diagnosis not present

## 2018-05-29 MED ORDER — DICLOFENAC SODIUM 75 MG PO TBEC: 75.0000 mg | DELAYED_RELEASE_TABLET | Freq: Two times a day (BID) | ORAL | 0 refills | Status: DC

## 2018-05-29 NOTE — Telephone Encounter (Signed)
I informed pt of Dr. Leeanne Rio review of MRI results and orders. Pt states understanding and I transferred her to schedulers.

## 2018-05-29 NOTE — Telephone Encounter (Signed)
-----   Message from Landis Martins, [place] sent at 05/29/2018 11:28 AM EST ----- Regarding: MRI Results Will you please let patient know that her MRI is negative for a tear however it does show inflammation at posterior tibial tendon. Recommend her to keep using CAM boot for 1 more week then she can transition back to a tennis shoe and to avoid excess activity like exercise that can cause the tendon to flare back up. We can also send a refill of the diclofenac to her pharmacy and have her to make a 3 week appointment for follow up. Thanks Dr. Cannon Kettle

## 2018-06-21 ENCOUNTER — Ambulatory Visit (INDEPENDENT_AMBULATORY_CARE_PROVIDER_SITE_OTHER): Payer: BLUE CROSS/BLUE SHIELD | Admitting: Sports Medicine

## 2018-06-21 ENCOUNTER — Encounter: Payer: Self-pay | Admitting: Sports Medicine

## 2018-06-21 DIAGNOSIS — M76821 Posterior tibial tendinitis, right leg: Secondary | ICD-10-CM

## 2018-06-21 DIAGNOSIS — M2141 Flat foot [pes planus] (acquired), right foot: Secondary | ICD-10-CM

## 2018-06-21 DIAGNOSIS — M79671 Pain in right foot: Secondary | ICD-10-CM | POA: Diagnosis not present

## 2018-06-21 DIAGNOSIS — M2142 Flat foot [pes planus] (acquired), left foot: Secondary | ICD-10-CM

## 2018-06-21 MED ORDER — TRIAMCINOLONE ACETONIDE 10 MG/ML IJ SUSP
10.0000 mg | Freq: Once | INTRAMUSCULAR | Status: AC
  Administered 2018-06-21: 10 mg

## 2018-06-21 NOTE — Progress Notes (Signed)
Subjective: Ashley Mercado is a 47 y.o. female patient who returns to office for follow-up evaluation of right foot and ankle pain.  Patient reports that pain is better states that she wore her cam boot for 1 week and has been taking diclofenac states that the pain is a achy sensation in her foot and ankle at the medial aspect 3-4 out of 10.  Patient is also here for discussion of MRI results.  Patient denies any other pedal complaints at this time.   Patient Active Problem List   Diagnosis Date Noted  . Dysuria 04/12/2018  . Prediabetes 04/06/2017  . Sebaceous cyst 04/06/2017  . Carpal tunnel syndrome 11/10/2016  . Allergic rhinitis 11/10/2016  . Encounter for routine gynecological examination 03/10/2016  . Screening for HIV (human immunodeficiency virus) 03/07/2015  . Essential hypertension 10/28/2014  . PCOS (polycystic ovarian syndrome) 10/28/2014  . Stress reaction 03/01/2014  . Other screening mammogram 02/18/2012  . Plantar fasciitis of left foot 02/18/2012  . Obesity (BMI 30-39.9) 10/20/2011  . Elevated blood-pressure reading without diagnosis of hypertension 10/20/2011  . Routine general medical examination at a health care facility 02/06/2011  . Depression 11/04/2010  . HIDRADENITIS SUPPURATIVA 05/12/2010  . 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 Elevated platelet count 11/30/2006  . KELOID SCAR 11/29/2006  . FOLLICULITIS 52/84/1324    Current Outpatient Medications on File Prior to Visit  Medication Sig Dispense Refill  . diclofenac (VOLTAREN) 75 MG EC tablet Take 1 tablet (75 mg total) by mouth 2 (two) times daily. 50 tablet 0  . DYMISTA 137-50 MCG/ACT SUSP     . EPIPEN 2-PAK 0.3 MG/0.3ML SOAJ injection     . levocetirizine (XYZAL) 5 MG tablet Take 2.5 mg by mouth.     . montelukast (SINGULAIR) 10 MG tablet     . Multiple Vitamins-Minerals (MULTIVITAMIN GUMMIES ADULT PO) Take 2 each by mouth daily.    Marland Kitchen spironolactone (ALDACTONE) 50 MG tablet TAKE 1 TABLET (50 MG TOTAL) BY MOUTH DAILY. 30  tablet 11   No current facility-administered medications on file prior to visit.     Allergies  Allergen Reactions  . Other     artificial sweeteners  . Sulfa Antibiotics Rash    Objective:  General: Alert and oriented x3 in no acute distress  Dermatology: No open lesions bilateral lower extremities, no webspace macerations, no ecchymosis bilateral, all nails x 10 are well manicured.  Vascular: Dorsalis Pedis and Posterior Tibial pedal pulses palpable, Capillary Fill Time 3 seconds,(+) pedal hair growth bilateral, no edema bilateral lower extremities, Temperature gradient within normal limits.  No frank edema noted bilateral.  Neurology: Gross sensation intact via light touch bilateral.  Musculoskeletal: There is mild pinpoint tenderness with palpation at right foot along the posterior tibial tendon course,No pain with calf compression bilateral. There is decreased ankle rom with knee extending  vs flexed resembling gastroc equnius bilateral, Subtalar joint range of motion is within normal limits, there is mild limitation with range of motion at the first metatarsophalangeal joint, pes planus foot type bilateral. Strength within normal limits in all groups bilateral.   Assessment and Plan: Problem List Items Addressed This Visit    None    Visit Diagnoses    Posterior tibial tendinitis of right leg    -  Primary   Relevant Medications   triamcinolone acetonide (KENALOG) 10 MG/ML injection 10 mg (Completed) (Start on 06/21/2018 11:30 AM)   Right foot pain       Pes planus of both feet          -  Complete examination performed -Re-Discussed continued care for tendinitis  -MRI results reviewed that reveals tendinitis at the posterior tibial tendon and arthritis at the first metatarsophalangeal joint and second tarsometatarsal joint -After oral consent and aseptic prep, injected a mixture containing 1 ml of 2%  plain lidocaine, 1 ml 0.5% plain marcaine, 0.5 ml of kenalog 10 and 0.5  ml of dexamethasone phosphate along the posterior tibial tendon course without complication. Post-injection care discussed with patient.  -Advised patient to continue with her supportive brace especially if she is going to be doing a lot of standing or walking in the upcoming days or if pain flares may use cam boot and to call office to inform us that her pain has flared up to get her in sooner otherwise if she is doing good continue with good supportive shoes, rest, ice, topical pain creams and elevation as needed -Continue with diclofenac as needed -Patient to return to office and 3 weeks or sooner if condition worsens.  Advised patient at next appointment if pain continues to be doing better will have patient casted for UCBL type orthotic.  Landis Martins, DPM

## 2018-06-26 ENCOUNTER — Other Ambulatory Visit: Payer: Self-pay | Admitting: Sports Medicine

## 2018-06-26 DIAGNOSIS — M76821 Posterior tibial tendinitis, right leg: Secondary | ICD-10-CM

## 2018-07-13 ENCOUNTER — Ambulatory Visit (INDEPENDENT_AMBULATORY_CARE_PROVIDER_SITE_OTHER): Payer: BLUE CROSS/BLUE SHIELD | Admitting: Sports Medicine

## 2018-07-13 ENCOUNTER — Encounter: Payer: Self-pay | Admitting: Sports Medicine

## 2018-07-13 DIAGNOSIS — M79671 Pain in right foot: Secondary | ICD-10-CM

## 2018-07-13 DIAGNOSIS — M76821 Posterior tibial tendinitis, right leg: Secondary | ICD-10-CM

## 2018-07-13 DIAGNOSIS — M722 Plantar fascial fibromatosis: Secondary | ICD-10-CM

## 2018-07-13 DIAGNOSIS — M2141 Flat foot [pes planus] (acquired), right foot: Secondary | ICD-10-CM | POA: Diagnosis not present

## 2018-07-13 DIAGNOSIS — M2142 Flat foot [pes planus] (acquired), left foot: Secondary | ICD-10-CM

## 2018-07-13 NOTE — Progress Notes (Signed)
Subjective: Ashley Mercado is a 48 y.o. female patient who returns to office for follow-up evaluation of right foot and ankle pain.  Patient reports that pain is better and states that she thinks about her foot less and it does not interrupt her day anymore.  Pain is 1 out of 10.  Patient reports that her brace helps and that she has completed her diclofenac with no problems or issues.  Patient denies redness warmth swelling or any other constitutional symptoms at this time.  Patient denies any other pedal complaints at this time.   Patient Active Problem List   Diagnosis Date Noted  . Dysuria 04/12/2018  . Prediabetes 04/06/2017  . Sebaceous cyst 04/06/2017  . Carpal tunnel syndrome 11/10/2016  . Allergic rhinitis 11/10/2016  . Encounter for routine gynecological examination 03/10/2016  . Screening for HIV (human immunodeficiency virus) 03/07/2015  . Essential hypertension 10/28/2014  . PCOS (polycystic ovarian syndrome) 10/28/2014  . Stress reaction 03/01/2014  . Other screening mammogram 02/18/2012  . Plantar fasciitis of left foot 02/18/2012  . Obesity (BMI 30-39.9) 10/20/2011  . Elevated blood-pressure reading without diagnosis of hypertension 10/20/2011  . Routine general medical examination at a health care facility 02/06/2011  . Depression 11/04/2010  . HIDRADENITIS SUPPURATIVA 05/12/2010  . 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 Elevated platelet count 11/30/2006  . KELOID SCAR 11/29/2006  . FOLLICULITIS 10/62/6948    Current Outpatient Medications on File Prior to Visit  Medication Sig Dispense Refill  . diclofenac (VOLTAREN) 75 MG EC tablet TAKE 1 TABLET BY MOUTH TWICE A DAY 50 tablet 0  . DYMISTA 137-50 MCG/ACT SUSP     . EPIPEN 2-PAK 0.3 MG/0.3ML SOAJ injection     . levocetirizine (XYZAL) 5 MG tablet Take 2.5 mg by mouth.     . montelukast (SINGULAIR) 10 MG tablet     . Multiple Vitamins-Minerals (MULTIVITAMIN GUMMIES ADULT PO) Take 2 each by mouth daily.    Marland Kitchen spironolactone (ALDACTONE)  50 MG tablet TAKE 1 TABLET (50 MG TOTAL) BY MOUTH DAILY. 30 tablet 11   No current facility-administered medications on file prior to visit.     Allergies  Allergen Reactions  . Other     artificial sweeteners  . Sulfa Antibiotics Rash    Objective:  General: Alert and oriented x3 in no acute distress  Dermatology: No open lesions bilateral lower extremities, no webspace macerations, no ecchymosis bilateral, all nails x 10 are well manicured.  Vascular: Dorsalis Pedis and Posterior Tibial pedal pulses palpable, Capillary Fill Time 3 seconds,(+) pedal hair growth bilateral, no edema bilateral lower extremities, Temperature gradient within normal limits.  No frank edema noted bilateral.  Neurology: Gross sensation intact via light touch bilateral.  Musculoskeletal: There is no tenderness with palpation at right foot along the posterior tibial tendon course,No pain with palpation to medial arch or plantar fascial insertion on the right foot.  No pain with calf compression bilateral. There is decreased ankle rom with knee extending  vs flexed resembling gastroc equnius bilateral, Subtalar joint range of motion is within normal limits, there is mild limitation with range of motion at the first metatarsophalangeal joint, pes planus foot type bilateral. Strength within normal limits in all groups bilateral.   Assessment and Plan: Problem List Items Addressed This Visit    None    Visit Diagnoses    Posterior tibial tendinitis of right leg    -  Primary   Right foot pain       Pes planus of  both feet       Plantar fasciitis          -Complete examination performed -Re-Discussed with patient long-term care for tendinitis -Continue with ankle brace -Recommend long-term custom functional foot orthotics patient was casted today for a UCBL type of orthotic and this prescription will be sent to [facility] once benefits are discussed and if patient agrees  -Advised patient to continue with  good supportive shoes, rest, ice, topical pain creams and elevation as needed -Patient to return to office pickup orthotics or sooner if problems or issues arise.  Landis Martins, DPM

## 2018-09-12 ENCOUNTER — Ambulatory Visit (INDEPENDENT_AMBULATORY_CARE_PROVIDER_SITE_OTHER): Payer: BLUE CROSS/BLUE SHIELD | Admitting: Family Medicine

## 2018-09-12 ENCOUNTER — Encounter: Payer: Self-pay | Admitting: Family Medicine

## 2018-09-12 VITALS — BP 148/94 | HR 88 | Temp 98.2°F | Ht 66.5 in | Wt 256.6 lb

## 2018-09-12 DIAGNOSIS — K2970 Gastritis, unspecified, without bleeding: Secondary | ICD-10-CM | POA: Insufficient documentation

## 2018-09-12 DIAGNOSIS — K2900 Acute gastritis without bleeding: Secondary | ICD-10-CM

## 2018-09-12 DIAGNOSIS — R079 Chest pain, unspecified: Secondary | ICD-10-CM | POA: Insufficient documentation

## 2018-09-12 DIAGNOSIS — I10 Essential (primary) hypertension: Secondary | ICD-10-CM | POA: Diagnosis not present

## 2018-09-12 DIAGNOSIS — F430 Acute stress reaction: Secondary | ICD-10-CM

## 2018-09-12 MED ORDER — OMEPRAZOLE 20 MG PO CPDR: 20.0000 mg | DELAYED_RELEASE_CAPSULE | Freq: Every day | ORAL | 3 refills | Status: DC

## 2018-09-12 NOTE — Assessment & Plan Note (Signed)
Suspect this is causing epigastric and chest pain (in response to stress)  Disc need for life changes/ ie job Px omeprazole 20 mg daily  Bland diet -do not skip meals  inst to alert [place] if worse or not improved  Off work the rest of the week F/u 2 wk

## 2018-09-12 NOTE — Progress Notes (Signed)
Subjective:    Patient ID: Ashley Mercado, female    DOB: Aug 31, 1970, 48 y.o.   MRN: [id number]  HPI Here for c/o abdominal and chest pain   Wt Readings from Last 3 Encounters:  09/12/18 256 lb 9 oz (116.4 kg)  04/12/18 251 lb 8 oz (114.1 kg)  04/06/17 260 lb (117.9 kg)   40.79 kg/m   Symptoms off and on 1-2 times per week (longer than a month)  Stomach pain / into lower/anterior chest  Epigastric area  occ nagging /fleeting pains lower  Perhaps a bit worse on the L  Sharp at times /no pressure  Almost feels like muscle pain  Not exertional at all  Thought it was gas  Rad to jaw line- a little tight along with shoulders /neck  Felt nausea once- did not vomit  No cold sweats  No sob  No palpitations (heart beats hard at times)   No virus of fever   Stress  Her job is bad right - feels overwhelmed  She has to manage more than other people in her position  Works in [employer], materials (in plants)  Her superiors do not understand - cause she does so well   Anxiety- her symptoms include  Nervousness  Exhausted  No time for exercise   No time to job search   EKG NSR with rate of 86 and low voltage in precordial leads       Lab Results  Component Value Date   WBC 5.5 04/07/2018   HGB 14.1 04/07/2018   HCT 42.4 04/07/2018   MCV 88.2 04/07/2018   PLT 460.0 (H) 04/07/2018   Lab Results  Component Value Date   HGBA1C 5.9 04/07/2018    Patient Active Problem List   Diagnosis Date Noted  . Gastritis 09/12/2018  . Chest pain 09/12/2018  . Dysuria 04/12/2018  . Prediabetes 04/06/2017  . Sebaceous cyst 04/06/2017  . Carpal tunnel syndrome 11/10/2016  . Allergic rhinitis 11/10/2016  . Encounter for routine gynecological examination 03/10/2016  . Screening for HIV (human immunodeficiency virus) 03/07/2015  . Essential hypertension 10/28/2014  . PCOS (polycystic ovarian syndrome) 10/28/2014  . Stress reaction 03/01/2014  . Other screening mammogram 02/18/2012  .  Plantar fasciitis of left foot 02/18/2012  . Morbid obesity ([place]) 10/20/2011  . Elevated blood-pressure reading without diagnosis of hypertension 10/20/2011  . Routine general medical examination at a health care facility 02/06/2011  . Depression 11/04/2010  . HIDRADENITIS SUPPURATIVA 05/12/2010  . 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 Elevated platelet count 11/30/2006  . KELOID SCAR 11/29/2006  . FOLLICULITIS 52/84/1324   Past Medical History:  Diagnosis Date  . Folliculitis   . HSV infection   . Hydradenitis   . Hypertension   . Keloid scar   . Menorrhagia    No past surgical history on file. Social History   Tobacco Use  . Smoking status: Never Smoker  . Smokeless tobacco: Never Used  Substance Use Topics  . Alcohol use: Yes    Alcohol/week: 0.0 standard drinks    Comment: rare  . Drug use: No   Family History  Problem Relation Age of Onset  . Hypertension Father   . Diabetes Father   . Hypertension Mother   . Diabetes Mother   . Cancer Maternal Uncle        lymphoma  . Cancer Maternal Grandmother        ovarian   Allergies  Allergen Reactions  . Other     artificial sweeteners  .  Sulfa Antibiotics Rash   Current Outpatient Medications on File Prior to Visit  Medication Sig Dispense Refill  . diclofenac (VOLTAREN) 75 MG EC tablet TAKE 1 TABLET BY MOUTH TWICE A DAY 50 tablet 0  . DYMISTA 137-50 MCG/ACT SUSP     . EPIPEN 2-PAK 0.3 MG/0.3ML SOAJ injection     . levocetirizine (XYZAL) 5 MG tablet Take 2.5 mg by mouth.     . montelukast (SINGULAIR) 10 MG tablet     . Multiple Vitamins-Minerals (MULTIVITAMIN GUMMIES ADULT PO) Take 2 each by mouth daily.    Marland Kitchen spironolactone (ALDACTONE) 50 MG tablet TAKE 1 TABLET (50 MG TOTAL) BY MOUTH DAILY. 30 tablet 11   No current facility-administered medications on file prior to visit.     Review of Systems  Constitutional: Positive for fatigue. Negative for activity change, appetite change, fever and unexpected weight change.  HENT:  Negative for congestion, ear pain, rhinorrhea, sinus pressure and sore throat.   Eyes: Negative for pain, redness and visual disturbance.  Respiratory: Negative for cough, shortness of breath and wheezing.   Cardiovascular: Positive for chest pain. Negative for palpitations and leg swelling.  Gastrointestinal: Positive for abdominal pain and nausea. Negative for abdominal distention, anal bleeding, blood in stool, constipation, diarrhea and vomiting.  Endocrine: Negative for polydipsia and polyuria.  Genitourinary: Negative for dysuria, frequency and urgency.  Musculoskeletal: Negative for arthralgias, back pain and myalgias.  Skin: Negative for pallor and rash.  Allergic/Immunologic: Negative for environmental allergies.  Neurological: Negative for dizziness, syncope and headaches.  Hematological: Negative for adenopathy. Does not bruise/bleed easily.  Psychiatric/Behavioral: Positive for decreased concentration and dysphoric mood. Negative for self-injury and suicidal ideas. The patient is nervous/anxious.        Objective:   Physical Exam Constitutional:      General: She is not in acute distress.    Appearance: She is well-developed. She is obese. She is not ill-appearing or diaphoretic.  HENT:     Head: Normocephalic and atraumatic.     Mouth/Throat:     Mouth: Mucous membranes are moist.     Pharynx: Oropharynx is clear.  Eyes:     General: No scleral icterus.    Conjunctiva/sclera: Conjunctivae normal.     Pupils: Pupils are equal, round, and reactive to light.  Neck:     Musculoskeletal: Normal range of motion and neck supple.  Cardiovascular:     Rate and Rhythm: Normal rate and regular rhythm.     Heart sounds: Normal heart sounds.  Pulmonary:     Effort: Pulmonary effort is normal. No respiratory distress.     Breath sounds: Normal breath sounds. No wheezing or rales.  Abdominal:     General: Abdomen is flat. Bowel sounds are normal. There is no distension or  abdominal bruit.     Palpations: Abdomen is soft. There is no shifting dullness, fluid wave, hepatomegaly, splenomegaly, mass or pulsatile mass.     Tenderness: There is abdominal tenderness in the epigastric area. There is no right CVA tenderness, left CVA tenderness, guarding or rebound. Negative signs include Murphy's sign and McBurney's sign.     Hernia: No hernia is present.  Musculoskeletal:     Right lower leg: No edema.     Left lower leg: No edema.  Lymphadenopathy:     Cervical: No cervical adenopathy.  Skin:    General: Skin is warm and dry.     Capillary Refill: Capillary refill takes less than 2 seconds.  Coloration: Skin is not pale.     Findings: No erythema.  Neurological:     Mental Status: She is alert. Mental status is at baseline.     Coordination: Coordination normal.     Deep Tendon Reflexes: Reflexes normal.  Psychiatric:        Mood and Affect: Mood is depressed. Affect is blunt.        Speech: Speech normal.        Behavior: Behavior is slowed. Behavior is not withdrawn.        Thought Content: Thought content normal. Thought content is not delusional. Thought content does not include suicidal plan.        Cognition and Memory: Cognition normal.     Comments: Seems anxious and depressed            Assessment & Plan:   Problem List Items Addressed This Visit      Cardiovascular and Mediastinum   Essential hypertension    bp is elevated due to stress today likely  Will re check at 2 wk flu         Digestive   Gastritis - Primary    Suspect this is causing epigastric and chest pain (in response to stress)  Disc need for life changes/ ie job Px omeprazole 20 mg daily  Bland diet -do not skip meals  inst to alert [place] if worse or not improved  Off work the rest of the week F/u 2 wk        Other   Morbid obesity ([place])    Wt may be up due to stressors and lack of time for self care  Will address again at f/u      Stress reaction     Long discussion re: work abuse causing anxiety with mental as well as physical symptoms (gastritis and cp)  Will be off work until Monday and then urged her to make a plan to schedule vacation  In the meantime she will begin (husband to help) job search  Enc her to get rest  Enc family to discuss what they can do to help her  She declines counseling right now No SI  F/u 2 wk       Chest pain    Atypical/non exertional with reassuring eKG Suspect due to gastritis  Also to extreme stressors Will tx this and f/u  inst to call if no improvement or if worse or new symptoms       Relevant Orders   EKG 12-Lead (Completed)

## 2018-09-12 NOTE — Assessment & Plan Note (Addendum)
Long discussion re: work abuse causing anxiety with mental as well as physical symptoms (gastritis and cp)  Will be off work until Monday and then urged her to make a plan to schedule vacation  In the meantime she will begin (husband to help) job search  Enc her to get rest  Enc family to discuss what they can do to help her  She declines counseling right now No SI  F/u 2 wk

## 2018-09-12 NOTE — Patient Instructions (Addendum)
Start omeprazole once daily - first dose today  Then each am about 15-30 min before breakfast   Avoid excessive caffeine and spicy food Also acidic foods and beverages   Out of work the rest of the week  Use that time to rest  Also to make a game plan - perhaps start a job search or make game plan to start a job search (family meeting)   Plan you 1 week vacation very soon   Follow up in 2 weeks If worse- earlier (and let me know)

## 2018-09-12 NOTE — Assessment & Plan Note (Signed)
bp is elevated due to stress today likely  Will re check at 2 wk flu

## 2018-09-12 NOTE — Assessment & Plan Note (Signed)
Atypical/non exertional with reassuring eKG Suspect due to gastritis  Also to extreme stressors Will tx this and f/u  inst to call if no improvement or if worse or new symptoms

## 2018-09-12 NOTE — Assessment & Plan Note (Signed)
Wt may be up due to stressors and lack of time for self care  Will address again at f/u

## 2018-09-25 ENCOUNTER — Other Ambulatory Visit: Payer: Self-pay

## 2018-09-25 ENCOUNTER — Ambulatory Visit (INDEPENDENT_AMBULATORY_CARE_PROVIDER_SITE_OTHER): Payer: BLUE CROSS/BLUE SHIELD | Admitting: Family Medicine

## 2018-09-25 VITALS — BP 116/87

## 2018-09-25 DIAGNOSIS — I10 Essential (primary) hypertension: Secondary | ICD-10-CM

## 2018-09-25 DIAGNOSIS — F430 Acute stress reaction: Secondary | ICD-10-CM

## 2018-09-25 DIAGNOSIS — K2900 Acute gastritis without bleeding: Secondary | ICD-10-CM

## 2018-09-25 NOTE — Assessment & Plan Note (Signed)
Improved Job is improved- able to work 2 d per week from home and cut travel between facilities  Also may get and Chiropractor  Reassuring  Also family members are helping with cooking etc Will continue to follow

## 2018-09-25 NOTE — Progress Notes (Signed)
Virtual Visit via Telephone Note  I connected with Ashley Mercado on 09/25/18 at  3:15 PM EDT by telephone and verified that I am speaking with the correct person using two identifiers.   I discussed the limitations, risks, security and privacy concerns of performing an evaluation and management service by telephone and the availability of in person appointments. I also discussed with the patient that there may be a patient responsible charge related to this service. The patient expressed understanding and agreed to proceed.   History of Present Illness: Needs f/u for HTN   Last visit bp was elevated BP Readings from Last 3 Encounters:  09/12/18 (!) 148/94  04/12/18 126/78  04/06/17 120/78   Her bp came down significantly at home- was 3/11 116/87!   She was stressed on that day   Also had symptoms of gastritis  Started on omeprazole  Is improved /still has just a little pain  Can tell If she misses it  No heartburn and chest pain is better also  Diet is limited by what she can get at the store  Eating more regularly  Mother is there cooking as well as her husband   Job/stress Had a conversation with her boss  Will work out 2 days per week work from home  Also cut travel between facilities  Plans to find admin asst to help her also   Picked up a cold from her step son     Observations/Objective: Pt sounds more positive today on the phone/ less fatigued and down    Assessment and Plan:  Problem List Items Addressed This Visit      Cardiovascular and Mediastinum   Essential hypertension - Primary    Now with less stress bp is improved At home husband is checking it -was 116/87 Feels better  Trying to eat healthy         Digestive   Gastritis    Much improved with omeprazole Will continue for 3 mo and then check in with [place] re: how she is doing  Stress is improved Cp and reflux are better as well  Trying to watch diet         Other   Stress reaction   Improved Job is improved- able to work 2 d per week from home and cut travel between facilities  Also may get and Chiropractor  Reassuring  Also family members are helping with cooking etc Will continue to follow            Follow Up Instructions: Continue the omeprazole for the 3 months prescribed and let us know if symptoms do not continue to improve Let me know if stress/mood do not continue to improve with work changes I'm glad blood pressure is better Take care of yourself    I discussed the assessment and treatment plan with the patient. The patient was provided an opportunity to ask questions and all were answered. The patient agreed with the plan and demonstrated an understanding of the instructions.   The patient was advised to call back or seek an in-person evaluation if the symptoms worsen or if the condition fails to improve as anticipated.  I provided 17 minutes of non-face-to-face time during this encounter.   Loura Pardon, MD

## 2018-09-25 NOTE — Assessment & Plan Note (Signed)
Now with less stress bp is improved At home husband is checking it -was 116/87 Feels better  Trying to eat healthy

## 2018-09-25 NOTE — Assessment & Plan Note (Signed)
Much improved with omeprazole Will continue for 3 mo and then check in with [place] re: how she is doing  Stress is improved Cp and reflux are better as well  Trying to watch diet

## 2018-10-04 ENCOUNTER — Other Ambulatory Visit: Payer: Self-pay

## 2018-10-04 ENCOUNTER — Encounter: Payer: Self-pay | Admitting: Sports Medicine

## 2018-10-04 ENCOUNTER — Ambulatory Visit: Payer: BLUE CROSS/BLUE SHIELD | Admitting: *Deleted

## 2018-10-04 DIAGNOSIS — M2141 Flat foot [pes planus] (acquired), right foot: Secondary | ICD-10-CM

## 2018-10-04 DIAGNOSIS — M722 Plantar fascial fibromatosis: Secondary | ICD-10-CM

## 2018-10-04 DIAGNOSIS — M2142 Flat foot [pes planus] (acquired), left foot: Secondary | ICD-10-CM

## 2018-10-04 NOTE — Patient Instructions (Signed)

## 2018-10-04 NOTE — Progress Notes (Unsigned)
Pt came in office today 10/04/2018 to pick up a pair of fitted orthotics. Pt. Tried on the orthotics and stated the they felt good but it kind of "pushed up" on her right arch and they seemed a little tight with her shoes.  I advised the patient to try the orthotics with a wider shoe at home and and if it still gives her discomfort to give [place] a call back. I reviewed instructions with patient and she stated she understood. Pt. Was told to call back if she had any problems with her orthotics.

## 2018-10-05 NOTE — Progress Notes (Signed)
Patient ID: Ashley Mercado, female   DOB: 06-01-1971, 48 y.o.   MRN: [id number]   Pt came in office today 10/04/2018 to pick up a pair of fitted orthotics. Pt. Tried on the orthotics and stated the they felt good but it kind of "pushed up" on her right arch and they seemed a little tight with her shoes.  I advised the patient to try the orthotics with a wider shoe at home and and if it still gives her discomfort to give [place] a call back. I reviewed instructions with patient and she stated she understood. Pt. Was told to call back if she had any problems with her orthotics.

## 2018-12-07 DIAGNOSIS — K219 Gastro-esophageal reflux disease without esophagitis: Secondary | ICD-10-CM | POA: Diagnosis not present

## 2018-12-07 DIAGNOSIS — H1045 Other chronic allergic conjunctivitis: Secondary | ICD-10-CM | POA: Diagnosis not present

## 2018-12-07 DIAGNOSIS — R05 Cough: Secondary | ICD-10-CM | POA: Diagnosis not present

## 2018-12-07 DIAGNOSIS — J3089 Other allergic rhinitis: Secondary | ICD-10-CM | POA: Diagnosis not present

## 2018-12-22 DIAGNOSIS — Z20828 Contact with and (suspected) exposure to other viral communicable diseases: Secondary | ICD-10-CM | POA: Diagnosis not present

## 2019-04-05 ENCOUNTER — Other Ambulatory Visit: Payer: Self-pay | Admitting: Sports Medicine

## 2019-04-05 DIAGNOSIS — M779 Enthesopathy, unspecified: Secondary | ICD-10-CM

## 2019-04-06 ENCOUNTER — Other Ambulatory Visit: Payer: Self-pay

## 2019-04-06 ENCOUNTER — Ambulatory Visit (INDEPENDENT_AMBULATORY_CARE_PROVIDER_SITE_OTHER): Payer: BC Managed Care – PPO | Admitting: Sports Medicine

## 2019-04-06 ENCOUNTER — Ambulatory Visit (INDEPENDENT_AMBULATORY_CARE_PROVIDER_SITE_OTHER): Payer: BC Managed Care – PPO

## 2019-04-06 DIAGNOSIS — M7661 Achilles tendinitis, right leg: Secondary | ICD-10-CM | POA: Diagnosis not present

## 2019-04-06 DIAGNOSIS — M76821 Posterior tibial tendinitis, right leg: Secondary | ICD-10-CM

## 2019-04-06 DIAGNOSIS — M2141 Flat foot [pes planus] (acquired), right foot: Secondary | ICD-10-CM

## 2019-04-06 DIAGNOSIS — M79671 Pain in right foot: Secondary | ICD-10-CM

## 2019-04-06 DIAGNOSIS — M2142 Flat foot [pes planus] (acquired), left foot: Secondary | ICD-10-CM

## 2019-04-06 DIAGNOSIS — M779 Enthesopathy, unspecified: Secondary | ICD-10-CM

## 2019-04-06 NOTE — Progress Notes (Signed)
Subjective: Ashley Mercado is a 48 y.o. female patient who returns to office for follow-up evaluation of right foot and ankle pain.  Patient reports that pain is back and over the last month has been really worse.  Patient reports that she cannot walk for exercise had to start riding a bike.  Pain is 7 out of 10.  Patient reports that there is a sharp and achy pain at the medial foot and ankle that radiates to the toes has been occasionally taking Advil and using custom orthotics resting icing elevating as tolerated and using her ankle brace.  Patient denies any new injury to the right foot or ankle.  Patient denies redness warmth swelling or any other constitutional symptoms at this time.  Patient denies any other pedal complaints at this time.    Patient Active Problem List   Diagnosis Date Noted  . Gastritis 09/12/2018  . Prediabetes 04/06/2017  . Sebaceous cyst 04/06/2017  . Carpal tunnel syndrome 11/10/2016  . Allergic rhinitis 11/10/2016  . Encounter for routine gynecological examination 03/10/2016  . Screening for HIV (human immunodeficiency virus) 03/07/2015  . Essential hypertension 10/28/2014  . PCOS (polycystic ovarian syndrome) 10/28/2014  . Stress reaction 03/01/2014  . Other screening mammogram 02/18/2012  . Plantar fasciitis of left foot 02/18/2012  . Morbid obesity ([place]) 10/20/2011  . Elevated blood-pressure reading without diagnosis of hypertension 10/20/2011  . Routine general medical examination at a health care facility 02/06/2011  . Depression 11/04/2010  . HIDRADENITIS SUPPURATIVA 05/12/2010  . 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 Elevated platelet count 11/30/2006  . KELOID SCAR 11/29/2006  . FOLLICULITIS [id number]    Current Outpatient Medications on File Prior to Visit  Medication Sig Dispense Refill  . diclofenac (VOLTAREN) 75 MG EC tablet TAKE 1 TABLET BY MOUTH TWICE A DAY 50 tablet 0  . DYMISTA 137-50 MCG/ACT SUSP     . EPIPEN 2-PAK 0.3 MG/0.3ML SOAJ injection     .  levocetirizine (XYZAL) 5 MG tablet Take 2.5 mg by mouth.     . montelukast (SINGULAIR) 10 MG tablet     . Multiple Vitamins-Minerals (MULTIVITAMIN GUMMIES ADULT PO) Take 2 each by mouth daily.    Marland Kitchen omeprazole (PRILOSEC) 20 MG capsule Take 1 capsule (20 mg total) by mouth daily. 30 capsule 3  . spironolactone (ALDACTONE) 50 MG tablet TAKE 1 TABLET (50 MG TOTAL) BY MOUTH DAILY. 30 tablet 11   No current facility-administered medications on file prior to visit.     Allergies  Allergen Reactions  . Other     artificial sweeteners  . Sulfa Antibiotics Rash    Social History   Socioeconomic History  . Marital status: Married    Spouse name: Not on file  . Number of children: 0  . Years of education: masters  . Highest education level: Not on file  Occupational History  . Occupation: Building services engineer, Technical sales engineer  Social Needs  . Financial resource strain: Not on file  . Food insecurity    Worry: Not on file    Inability: Not on file  . Transportation needs    Medical: Not on file    Non-medical: Not on file  Tobacco Use  . Smoking status: Never Smoker  . Smokeless tobacco: Never Used  Substance and Sexual Activity  . Alcohol use: Yes    Alcohol/week: 0.0 standard drinks    Comment: rare  . Drug use: No  . Sexual activity: Not on file  Lifestyle  . Physical activity  Days per week: Not on file    Minutes per session: Not on file  . Stress: Not on file  Relationships  . Social Herbalist on phone: Not on file    Gets together: Not on file    Attends religious service: Not on file    Active member of club or organization: Not on file    Attends meetings of clubs or organizations: Not on file    Relationship status: Not on file  Other Topics Concern  . Not on file  Social History Narrative   Private cell: [phone].  Designated party form signed on 01/26/10 appointing no one.  May leave message on cell phone.  Lives with mom in a one story home.  No  children.  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  Education: Masters    Family History  Problem Relation Age of Onset  . Hypertension Father   . Diabetes Father   . Hypertension Mother   . Diabetes Mother   . Cancer Maternal Uncle        lymphoma  . Cancer Maternal Grandmother        ovarian    No past surgical history on file.  Objective:  General: Alert and oriented x3 in no acute distress  Dermatology: No open lesions bilateral lower extremities, no webspace macerations, no ecchymosis bilateral, all nails x 10 are well manicured.  Vascular: Dorsalis Pedis and Posterior Tibial pedal pulses palpable, Capillary Fill Time 3 seconds,(+) pedal hair growth bilateral, no edema bilateral lower extremities, Temperature gradient within normal limits.  No frank edema noted bilateral.  Neurology: Gross sensation intact via light touch bilateral.  Musculoskeletal: There is tenderness with palpation at right foot along the posterior tibial tendon course, there is mild pain to the insertion of the Achilles on the right.  No pain with palpation to medial arch or plantar fascial insertion on the right foot.  No pain with calf compression bilateral. There is decreased ankle rom with knee extending  vs flexed resembling gastroc equnius bilateral, Subtalar joint range of motion is within normal limits, there is mild limitation with range of motion at the first metatarsophalangeal joint, pes planus foot type bilateral. Strength within normal limits in all groups bilateral.   X-rays right foot normal osseous mineralization, midtarsal breech supportive of pes planus deformity.  No other acute findings.  Assessment and Plan: Problem List Items Addressed This Visit    None    Visit Diagnoses    Posterior tibial tendinitis of right leg    -  Primary   Right foot pain       Pes planus of both feet       Tendonitis, Achilles, right          -Complete examination performed -Re-Discussed tendinitis  with pes planus deformity -Patient opt for surgical management. Consent obtained for posterior tibial tendon repair with suture anchor and Achilles tendon release if needed on right. Pre and Post op course explained. Risks, benefits, alternatives explained. No guarantees given or implied. Surgical booking slip submitted and provided patient with Surgical packet and info for [facility] -To dispense crutches at surgical center patient aware to be nonweightbearing 3 to 4 weeks -Ordered knee scooter patient will be nonweightbearing for 4 weeks this is medically necessary to assist with postoperative healing -Meanwhile continue with use of orthotics, good supportive shoes rest ice elevation and over-the-counter anti-inflammatories -Patient to return to office after surgery or sooner if problems or  issues arise.  Landis Martins, DPM

## 2019-04-06 NOTE — Patient Instructions (Signed)
Pre-Operative Instructions  Congratulations, you have decided to take an important step towards improving your quality of life.  You can be assured that the doctors and staff at [facility] will be with you every step of the way.  Here are some important things you should know:  1. Plan to be at the surgery center/hospital at least 1 (one) hour prior to your scheduled time, unless otherwise directed by the surgical center/hospital staff.  You must have a responsible adult accompany you, remain during the surgery and drive you home.  Make sure you have directions to the surgical center/hospital to ensure you arrive on time. 2. If you are having surgery at [facility] or [facility] hospitals, you will need a copy of your medical history and physical form from your family physician within one month prior to the date of surgery. We will give you a form for your primary physician to complete.  3. We make every effort to accommodate the date you request for surgery.  However, there are times where surgery dates or times have to be moved.  We will contact you as soon as possible if a change in schedule is required.   4. No aspirin/ibuprofen for one week before surgery.  If you are on aspirin, any non-steroidal anti-inflammatory medications (Mobic, Aleve, Ibuprofen) should not be taken seven (7) days prior to your surgery.  You make take Tylenol for pain prior to surgery.  5. Medications - If you are taking daily heart and blood pressure medications, seizure, reflux, allergy, asthma, anxiety, pain or diabetes medications, make sure you notify the surgery center/hospital before the day of surgery so they can tell you which medications you should take or avoid the day of surgery. 6. No food or drink after midnight the night before surgery unless directed otherwise by surgical center/hospital staff. 7. No alcoholic beverages 24-hours prior to surgery.  No smoking 24-hours prior or 24-hours after  surgery. 8. Wear loose pants or shorts. They should be loose enough to fit over bandages, boots, and casts. 9. Don't wear slip-on shoes. Sneakers are preferred. 10. Bring your boot with you to the surgery center/hospital.  Also bring crutches or a walker if your physician has prescribed it for you.  If you do not have this equipment, it will be provided for you after surgery. 11. If you have not been contacted by the surgery center/hospital by the day before your surgery, call to confirm the date and time of your surgery. 12. Leave-time from work may vary depending on the type of surgery you have.  Appropriate arrangements should be made prior to surgery with your employer. 13. Prescriptions will be provided immediately following surgery by your doctor.  Fill these as soon as possible after surgery and take the medication as directed. Pain medications will not be refilled on weekends and must be approved by the doctor. 14. Remove nail polish on the operative foot and avoid getting pedicures prior to surgery. 15. Wash the night before surgery.  The night before surgery wash the foot and leg well with water and the antibacterial soap provided. Be sure to pay special attention to beneath the toenails and in between the toes.  Wash for at least three (3) minutes. Rinse thoroughly with water and dry well with a towel.  Perform this wash unless told not to do so by your physician.  Enclosed: 1 Ice pack (please put in freezer the night before surgery)   1 Hibiclens skin cleaner     Pre-op instructions  If you have any questions regarding the instructions, please do not hesitate to call our office.  [place]: [address], [place], [place] [address] -- [phone]  [place]: [address] [place], [place] [address] -- [phone]  [place]: [address]  [place],  [address] -- [phone]   Website: [email or web address] 

## 2019-04-09 ENCOUNTER — Telehealth: Payer: Self-pay | Admitting: *Deleted

## 2019-04-09 NOTE — Telephone Encounter (Signed)
Entered in error, she has two charts.

## 2019-04-09 NOTE — Telephone Encounter (Signed)
"  I'm calling to schedule my surgery with Dr. Cannon Kettle."  Do you have a date that you like?  "I'd like to do it this month."  Dr. Cannon Kettle can do it on October 19 or 26, 2020.  "Let's schedule it for the 19th."  I'll get it scheduled for 04/23/2019.  You need to go online and register with the surgical center, instructions are in the brochure that we gave you.  "Okay, I have that.  I'll take care of it."

## 2019-04-10 ENCOUNTER — Other Ambulatory Visit: Payer: Self-pay | Admitting: Sports Medicine

## 2019-04-10 ENCOUNTER — Telehealth: Payer: Self-pay | Admitting: Sports Medicine

## 2019-04-10 DIAGNOSIS — M76821 Posterior tibial tendinitis, right leg: Secondary | ICD-10-CM

## 2019-04-10 NOTE — Telephone Encounter (Addendum)
DOS: 04/23/2019 SURGICAL PROCEDURES: Repair Post Tibial Tendon/Secondary Rt, Tendo-Achilles Length Rt, Tarsal Exostectomy/Kidner Procedure Rt  CPT CODES: 37628, [phone], [phone] DX CODES: M76.60, Q66.50, M65.879  Member Information   Member Number: [id number]  Policy Effective : [id number]  -  07/04/9998   Name: Ashley Mercado  Date of Birth: 06/19/1971  Member Liability Summary       In-Network   Max Per Benefit Period Year-to-Date Remaining     CoInsurance         Deductible $1,500.00 $468.75     Out-Of-Pocket 3 $6,000.00 $4,927.89      Out-of-Network   Max Per Benefit Period Year-to-Date Remaining     CoInsurance         Deductible $4,500.00 $4,500.00     Out-Of-Pocket 3 $12,000.00 $12,000.00     [facility]      In-Network Copay Coinsurance Authorization Required Not Applicable [id number]  See Messages Utilization Management Organization: [employer] Telephone:  [phone]      Out of Network Copay Coinsurance Authorization Required Not Applicable [id number]  See Messages  Per Alfred Levins, no prior authorization is required Ref# [id number].

## 2019-04-12 ENCOUNTER — Telehealth: Payer: Self-pay | Admitting: Family Medicine

## 2019-04-12 DIAGNOSIS — I10 Essential (primary) hypertension: Secondary | ICD-10-CM

## 2019-04-12 DIAGNOSIS — R7989 Other specified abnormal findings of blood chemistry: Secondary | ICD-10-CM

## 2019-04-12 DIAGNOSIS — R7303 Prediabetes: Secondary | ICD-10-CM

## 2019-04-12 DIAGNOSIS — E282 Polycystic ovarian syndrome: Secondary | ICD-10-CM

## 2019-04-12 NOTE — Telephone Encounter (Signed)
-----   Message from Ellamae Sia sent at 04/06/2019  9:58 AM EDT ----- Regarding: Lab orders for Friday, 10.9.20 Patient is scheduled for CPX labs, please order future labs, Thanks , Karna Christmas

## 2019-04-13 ENCOUNTER — Other Ambulatory Visit (INDEPENDENT_AMBULATORY_CARE_PROVIDER_SITE_OTHER): Payer: BC Managed Care – PPO

## 2019-04-13 ENCOUNTER — Other Ambulatory Visit: Payer: Self-pay

## 2019-04-13 DIAGNOSIS — I10 Essential (primary) hypertension: Secondary | ICD-10-CM | POA: Diagnosis not present

## 2019-04-13 DIAGNOSIS — R7303 Prediabetes: Secondary | ICD-10-CM | POA: Diagnosis not present

## 2019-04-13 DIAGNOSIS — R7989 Other specified abnormal findings of blood chemistry: Secondary | ICD-10-CM | POA: Diagnosis not present

## 2019-04-13 LAB — HEMOGLOBIN A1C: Hgb A1c MFr Bld: 6.1 % (ref 4.6–6.5)

## 2019-04-13 LAB — LIPID PANEL
Cholesterol: 188 mg/dL (ref 0–200)
HDL: 41.1 mg/dL (ref >39.00)
LDL Cholesterol: 131 mg/dL — ABNORMAL HIGH (ref 0–99)
NonHDL: 146.51
Total CHOL/HDL Ratio: 5
Triglycerides: 76 mg/dL (ref 0.0–149.0)
VLDL: 15.2 mg/dL (ref 0.0–40.0)

## 2019-04-13 LAB — COMPREHENSIVE METABOLIC PANEL
ALT: 14 U/L (ref 0–35)
AST: 14 U/L (ref 0–37)
Albumin: 4 g/dL (ref 3.5–5.2)
Alkaline Phosphatase: 82 U/L (ref 39–117)
BUN: 10 mg/dL (ref 6–23)
CO2: 27 mEq/L (ref 19–32)
Calcium: 9.3 mg/dL (ref 8.4–10.5)
Chloride: 103 mEq/L (ref 96–112)
Creatinine, Ser: 0.81 mg/dL (ref 0.40–1.20)
GFR: 90.4 mL/min (ref >60.00)
Glucose, Bld: 86 mg/dL (ref 70–99)
Potassium: 3.7 mEq/L (ref 3.5–5.1)
Sodium: 139 mEq/L (ref 135–145)
Total Bilirubin: 0.5 mg/dL (ref 0.2–1.2)
Total Protein: 7.9 g/dL (ref 6.0–8.3)

## 2019-04-13 LAB — CBC WITH DIFFERENTIAL/PLATELET
Basophils Absolute: 0 10*3/uL (ref 0.0–0.1)
Basophils Relative: 0.9 % (ref 0.0–3.0)
Eosinophils Absolute: 0 10*3/uL (ref 0.0–0.7)
Eosinophils Relative: 0.6 % (ref 0.0–5.0)
HCT: 43.4 % (ref 36.0–46.0)
Hemoglobin: 14.4 g/dL (ref 12.0–15.0)
Lymphocytes Relative: 35.2 % (ref 12.0–46.0)
Lymphs Abs: 1.4 10*3/uL (ref 0.7–4.0)
MCHC: 33.3 g/dL (ref 30.0–36.0)
MCV: 87.9 fl (ref 78.0–100.0)
Monocytes Absolute: 0.3 10*3/uL (ref 0.1–1.0)
Monocytes Relative: 7.6 % (ref 3.0–12.0)
Neutro Abs: 2.2 10*3/uL (ref 1.4–7.7)
Neutrophils Relative %: 55.7 % (ref 43.0–77.0)
Platelets: 446 10*3/uL — ABNORMAL HIGH (ref 150.0–400.0)
RBC: 4.93 Mil/uL (ref 3.87–5.11)
RDW: 13.7 % (ref 11.5–15.5)
WBC: 4 10*3/uL (ref 4.0–10.5)

## 2019-04-13 LAB — TSH: TSH: 1.28 u[IU]/mL (ref 0.35–4.50)

## 2019-04-16 LAB — PATHOLOGIST SMEAR REVIEW

## 2019-04-17 ENCOUNTER — Encounter: Payer: Self-pay | Admitting: Family Medicine

## 2019-04-17 ENCOUNTER — Ambulatory Visit (INDEPENDENT_AMBULATORY_CARE_PROVIDER_SITE_OTHER): Payer: BC Managed Care – PPO | Admitting: Family Medicine

## 2019-04-17 ENCOUNTER — Other Ambulatory Visit: Payer: Self-pay

## 2019-04-17 ENCOUNTER — Other Ambulatory Visit (HOSPITAL_COMMUNITY)
Admission: RE | Admit: 2019-04-17 | Discharge: 2019-04-17 | Disposition: A | Discharge status: Home / Self Care | Payer: BC Managed Care – PPO | Source: Ambulatory Visit | Attending: Family Medicine | Admitting: Family Medicine

## 2019-04-17 VITALS — BP 122/76 | HR 87 | Temp 98.1°F | Ht 66.5 in | Wt 258.4 lb

## 2019-04-17 DIAGNOSIS — R7989 Other specified abnormal findings of blood chemistry: Secondary | ICD-10-CM

## 2019-04-17 DIAGNOSIS — I10 Essential (primary) hypertension: Secondary | ICD-10-CM | POA: Diagnosis not present

## 2019-04-17 DIAGNOSIS — Z01419 Encounter for gynecological examination (general) (routine) without abnormal findings: Secondary | ICD-10-CM | POA: Insufficient documentation

## 2019-04-17 DIAGNOSIS — Z1211 Encounter for screening for malignant neoplasm of colon: Secondary | ICD-10-CM

## 2019-04-17 DIAGNOSIS — E282 Polycystic ovarian syndrome: Secondary | ICD-10-CM | POA: Diagnosis not present

## 2019-04-17 DIAGNOSIS — Z0000 Encounter for general adult medical examination without abnormal findings: Secondary | ICD-10-CM

## 2019-04-17 DIAGNOSIS — Z23 Encounter for immunization: Secondary | ICD-10-CM

## 2019-04-17 DIAGNOSIS — Z1159 Encounter for screening for other viral diseases: Secondary | ICD-10-CM | POA: Diagnosis not present

## 2019-04-17 DIAGNOSIS — R7303 Prediabetes: Secondary | ICD-10-CM

## 2019-04-17 MED ORDER — SPIRONOLACTONE 50 MG PO TABS: ORAL_TABLET | ORAL | 11 refills | Status: DC

## 2019-04-17 MED ORDER — OMEPRAZOLE 20 MG PO CPDR: 20.0000 mg | DELAYED_RELEASE_CAPSULE | Freq: Every day | ORAL | 11 refills | Status: DC

## 2019-04-17 NOTE — Assessment & Plan Note (Signed)
Reviewed health habits including diet and exercise and skin cancer prevention Reviewed appropriate screening tests for age  Also reviewed health mt list, fam hx and immunization status , as well as social and family history   See HPI Labs reviewed  Flu vaccine given  Ref done for screening colonoscopy  Pap /gyn exam done  Plans to check on coverage of shingles vaccine

## 2019-04-17 NOTE — Assessment & Plan Note (Signed)
Platelet ct 446 Rev path rev-thought to be reactive Possibly related to R foot tendon problem  Will continue to monitor

## 2019-04-17 NOTE — Assessment & Plan Note (Signed)
Lab Results  Component Value Date   HGBA1C 6.1 04/13/2019   disc imp of low glycemic diet and wt loss to prevent DM2

## 2019-04-17 NOTE — Assessment & Plan Note (Addendum)
Discussed how this problem influences overall health and the risks it imposes  Reviewed plan for weight loss with lower calorie diet (via better food choices and also portion control or program like weight watchers) and exercise building up to or more than 30 minutes 5 days per week including some aerobic activity   Pt is looking forward to exercise once foot is operated on and heals

## 2019-04-17 NOTE — Assessment & Plan Note (Signed)
Referral for first screening colonoscopy  Wants to have several weeks out from foot surgery if she is doing ok  Referral done

## 2019-04-17 NOTE — Assessment & Plan Note (Signed)
bp in fair control at this time  BP Readings from Last 1 Encounters:  04/17/19 122/76   No changes needed Most recent labs reviewed  Disc lifstyle change with low sodium diet and exercise

## 2019-04-17 NOTE — Assessment & Plan Note (Signed)
Pap and exam done today  No c/o

## 2019-04-17 NOTE — Assessment & Plan Note (Signed)
Lab Results  Component Value Date   HGBA1C 6.1 04/13/2019   Enc wt loss

## 2019-04-17 NOTE — Progress Notes (Signed)
Subjective:    Patient ID: Ashley Mercado, female    DOB: December 31, 1970, 48 y.o.   MRN: [id number]  HPI Here for health maintenance exam and to review chronic medical problems   Stressful at work - guarding against covid   Has surgery planned on her foot on Monday  May be out for 12 weeks  Tendon repair  Suffered for a long time    Wt Readings from Last 3 Encounters:  04/17/19 258 lb 6 oz (117.2 kg)  09/12/18 256 lb 9 oz (116.4 kg)  04/12/18 251 lb 8 oz (114.1 kg)  trying to take care of herself  Limited exercise due to foot  41.08 kg/m   Flu shot given today   Zoster status -interested in shingrix vaccine   Pt is interested in a colonoscopy   Pap was 9/17 -neg with neg HPV screen  No gyn problems  Menstrual hx -no periods   LMP- over a year  At night gets occ warm flashes    H/o pcos Lab Results  Component Value Date   HGBA1C 6.1 04/13/2019  up from 5.9  Insulin resistant/prediabetic  Craves sweets once in a while   Will be able to exercise when healed from surgery   Mammogram 11/19 -will schedule when ready  Self breast exam -no lumps   bp is stable today  No cp or palpitations or headaches or edema  No side effects to medicines  BP Readings from Last 3 Encounters:  04/17/19 122/76  09/25/18 116/87  09/12/18 (!) 148/94     Lab Results  Component Value Date   CREATININE 0.81 04/13/2019   BUN 10 04/13/2019   NA 139 04/13/2019   K 3.7 04/13/2019   CL 103 04/13/2019   CO2 27 04/13/2019   Lab Results  Component Value Date   ALT 14 04/13/2019   AST 14 04/13/2019   ALKPHOS 82 04/13/2019   BILITOT 0.5 04/13/2019    H/o elevated platelets Lab Results  Component Value Date   WBC 4.0 04/13/2019   HGB 14.4 04/13/2019   HCT 43.4 04/13/2019   MCV 87.9 04/13/2019   PLT 446.0 (H) 04/13/2019  path rev of smear- platelets elevated appears to be reactive   Lab Results  Component Value Date   TSH 1.28 04/13/2019    Cholesterol Lab Results   Component Value Date   CHOL 188 04/13/2019   CHOL 157 04/07/2018   CHOL 178 04/01/2017   Lab Results  Component Value Date   HDL 41.10 04/13/2019   HDL 45.10 04/07/2018   HDL 41.40 04/01/2017   Lab Results  Component Value Date   LDLCALC 131 (H) 04/13/2019   LDLCALC 101 (H) 04/07/2018   LDLCALC 123 (H) 04/01/2017   Lab Results  Component Value Date   TRIG 76.0 04/13/2019   TRIG 57.0 04/07/2018   TRIG 68.0 04/01/2017   Lab Results  Component Value Date   CHOLHDL 5 04/13/2019   CHOLHDL 3 04/07/2018   CHOLHDL 4 04/01/2017   No results found for: LDLDIRECT Too much fatty meat   Patient Active Problem List   Diagnosis Date Noted  . Colon cancer screening 04/17/2019  . Gastritis 09/12/2018  . Prediabetes 04/06/2017  . Carpal tunnel syndrome 11/10/2016  . Allergic rhinitis 11/10/2016  . Encounter for routine gynecological examination 03/10/2016  . Screening for HIV (human immunodeficiency virus) 03/07/2015  . Essential hypertension 10/28/2014  . PCOS (polycystic ovarian syndrome) 10/28/2014  . Stress reaction 03/01/2014  . Other  screening mammogram 02/18/2012  . Morbid obesity ([place]) 10/20/2011  . Elevated blood-pressure reading without diagnosis of hypertension 10/20/2011  . Routine general medical examination at a health care facility 02/06/2011  . Depression 11/04/2010  . HIDRADENITIS SUPPURATIVA 05/12/2010  . 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 Elevated platelet count 11/30/2006  . KELOID SCAR 11/29/2006  . FOLLICULITIS [id number]   Past Medical History:  Diagnosis Date  . Folliculitis   . HSV infection   . Hydradenitis   . Hypertension   . Keloid scar   . Menorrhagia    History reviewed. No pertinent surgical history. Social History   Tobacco Use  . Smoking status: Never Smoker  . Smokeless tobacco: Never Used  Substance Use Topics  . Alcohol use: Yes    Alcohol/week: 0.0 standard drinks    Comment: rare  . Drug use: No   Family History  Problem Relation Age  of Onset  . Hypertension Father   . Diabetes Father   . Hypertension Mother   . Diabetes Mother   . Cancer Maternal Uncle        lymphoma  . Cancer Maternal Grandmother        ovarian   Allergies  Allergen Reactions  . Dust Mite Extract Cough and Itching  . Fish Allergy Hives    *CATFISH*  . Other     artificial sweeteners  . Shrimp Extract Allergy Skin Test Itching and Swelling  . Sulfa Antibiotics Rash   Current Outpatient Medications on File Prior to Visit  Medication Sig Dispense Refill  . diclofenac (VOLTAREN) 75 MG EC tablet TAKE 1 TABLET BY MOUTH TWICE A DAY 50 tablet 0  . DYMISTA 137-50 MCG/ACT SUSP     . EPIPEN 2-PAK 0.3 MG/0.3ML SOAJ injection     . levocetirizine (XYZAL) 5 MG tablet Take 2.5 mg by mouth.     . montelukast (SINGULAIR) 10 MG tablet     . Multiple Vitamins-Minerals (MULTIVITAMIN GUMMIES ADULT PO) Take 2 each by mouth daily.     No current facility-administered medications on file prior to visit.     Review of Systems  Constitutional: Negative for activity change, appetite change, fatigue, fever and unexpected weight change.  HENT: Negative for congestion, ear pain, rhinorrhea, sinus pressure and sore throat.   Eyes: Negative for pain, redness and visual disturbance.  Respiratory: Negative for cough, shortness of breath and wheezing.   Cardiovascular: Negative for chest pain and palpitations.  Gastrointestinal: Negative for abdominal pain, blood in stool, constipation and diarrhea.  Endocrine: Negative for polydipsia and polyuria.  Genitourinary: Negative for dysuria, frequency and urgency.  Musculoskeletal: Negative for arthralgias, back pain and myalgias.       Acute on chronic R foot pain  Skin: Negative for pallor and rash.  Allergic/Immunologic: Negative for environmental allergies.  Neurological: Negative for dizziness, syncope and headaches.  Hematological: Negative for adenopathy. Does not bruise/bleed easily.  Psychiatric/Behavioral:  Negative for decreased concentration and dysphoric mood. The patient is not nervous/anxious.        Objective:   Physical Exam Constitutional:      General: She is not in acute distress.    Appearance: Normal appearance. She is well-developed. She is obese. She is not ill-appearing or diaphoretic.  HENT:     Head: Normocephalic and atraumatic.     Right Ear: Tympanic membrane, ear canal and external ear normal.     Left Ear: Tympanic membrane, ear canal and external ear normal.     Nose: Nose normal. No  congestion.     Mouth/Throat:     Mouth: Mucous membranes are moist.     Pharynx: Oropharynx is clear. No posterior oropharyngeal erythema.  Eyes:     General: No scleral icterus.    Extraocular Movements: Extraocular movements intact.     Conjunctiva/sclera: Conjunctivae normal.     Pupils: Pupils are equal, round, and reactive to light.  Neck:     Musculoskeletal: Normal range of motion and neck supple. No neck rigidity or muscular tenderness.     Thyroid: No thyromegaly.     Vascular: No carotid bruit or JVD.  Cardiovascular:     Rate and Rhythm: Normal rate and regular rhythm.     Pulses: Normal pulses.     Heart sounds: Normal heart sounds. No gallop.   Pulmonary:     Effort: Pulmonary effort is normal. No respiratory distress.     Breath sounds: Normal breath sounds. No wheezing.     Comments: Good air exch Chest:     Chest wall: No tenderness.  Abdominal:     General: Bowel sounds are normal. There is no distension or abdominal bruit.     Palpations: Abdomen is soft. There is no mass.     Tenderness: There is no abdominal tenderness.     Hernia: No hernia is present.  Genitourinary:    Comments: Breast exam: No mass, nodules, thickening, tenderness, bulging, retraction, inflamation, nipple discharge or skin changes noted.  No axillary or clavicular LA.              Anus appears normal w/o hemorrhoids or masses     External genitalia : nl appearance and hair  distribution/no lesions     Urethral meatus : nl size, no lesions or prolapse     Urethra: no masses, tenderness or scarring    Bladder : no masses or tenderness     Vagina: nl general appearance, no discharge or  Lesions, no significant cystocele  or rectocele     Cervix: no lesions/ discharge or friability    Uterus: nl size, contour, position, and mobility (not fixed) , non tender    Adnexa : no masses, tenderness, enlargement or nodularity       Musculoskeletal: Normal range of motion.        General: No tenderness.     Right lower leg: No edema.     Left lower leg: No edema.  Lymphadenopathy:     Cervical: No cervical adenopathy.  Skin:    General: Skin is warm and dry.     Coloration: Skin is not pale.     Findings: No erythema or rash.     Comments: Keloid scar R upper back   Neurological:     Mental Status: She is alert. Mental status is at baseline.     Cranial Nerves: No cranial nerve deficit.     Motor: No abnormal muscle tone.     Coordination: Coordination normal.     Gait: Gait normal.     Deep Tendon Reflexes: Reflexes are normal and symmetric. Reflexes normal.  Psychiatric:        Mood and Affect: Mood normal.        Cognition and Memory: Cognition and memory normal.           Assessment & Plan:   Problem List Items Addressed This Visit      Cardiovascular and Mediastinum   Essential hypertension    bp in fair control at this time  BP  Readings from Last 1 Encounters:  04/17/19 122/76   No changes needed Most recent labs reviewed  Disc lifstyle change with low sodium diet and exercise        Relevant Medications   spironolactone (ALDACTONE) 50 MG tablet     Endocrine   PCOS (polycystic ovarian syndrome)    Lab Results  Component Value Date   HGBA1C 6.1 04/13/2019   Enc wt loss         Other   Elevated platelet count    Platelet ct 446 Rev path rev-thought to be reactive Possibly related to R foot tendon problem   Will continue to monitor      Routine general medical examination at a health care facility - Primary    Reviewed health habits including diet and exercise and skin cancer prevention Reviewed appropriate screening tests for age  Also reviewed health mt list, fam hx and immunization status , as well as social and family history   See HPI Labs reviewed  Flu vaccine given  Ref done for screening colonoscopy  Pap /gyn exam done  Plans to check on coverage of shingles vaccine        Morbid obesity ([place])    Discussed how this problem influences overall health and the risks it imposes  Reviewed plan for weight loss with lower calorie diet (via better food choices and also portion control or program like weight watchers) and exercise building up to or more than 30 minutes 5 days per week including some aerobic activity   Pt is looking forward to exercise once foot is operated on and heals        Encounter for routine gynecological examination    Pap and exam done today  No c/o      Relevant Orders   Cytology - PAP()   Prediabetes    Lab Results  Component Value Date   HGBA1C 6.1 04/13/2019   disc imp of low glycemic diet and wt loss to prevent DM2       Colon cancer screening    Referral for first screening colonoscopy  Wants to have several weeks out from foot surgery if she is doing ok  Referral done       Relevant Orders   Ambulatory referral to Gastroenterology    Other Visit Diagnoses    Need for influenza vaccination       Relevant Orders   Flu Vaccine QUAD 6+ mos PF IM (Fluarix Quad PF) (Completed)

## 2019-04-17 NOTE — Patient Instructions (Addendum)
The shingrix vaccine is available to folks over 55  If your insurance covers it we can give it to you   I will do a GI referral for colonoscopy  Our office will call you to schedule that   For diabetes prevention Try to get most of your carbohydrates from produce (with the exception of white potatoes)  Eat less bread/pasta/rice/snack foods/cereals/sweets and other items from the middle of the grocery store (processed carbs)   For cholesterol Avoid red meat/ fried foods/ egg yolks/ fatty breakfast meats/ butter, cheese and high fat dairy/ and shellfish

## 2019-04-19 ENCOUNTER — Other Ambulatory Visit: Payer: Self-pay | Admitting: *Deleted

## 2019-04-19 ENCOUNTER — Telehealth: Payer: Self-pay

## 2019-04-19 ENCOUNTER — Other Ambulatory Visit: Payer: Self-pay

## 2019-04-19 DIAGNOSIS — M76821 Posterior tibial tendinitis, right leg: Secondary | ICD-10-CM

## 2019-04-19 DIAGNOSIS — M7661 Achilles tendinitis, right leg: Secondary | ICD-10-CM

## 2019-04-19 DIAGNOSIS — Z1211 Encounter for screening for malignant neoplasm of colon: Secondary | ICD-10-CM

## 2019-04-19 NOTE — Progress Notes (Signed)
Per Dr. Cannon Kettle, I placed an order for a knee scooter for Ms. Ashley Mercado.  I requested assistance from Jolee Ewing, from [facility], to get the knee scooter for Ms. Ashley Mercado.

## 2019-04-19 NOTE — Telephone Encounter (Signed)
Gastroenterology Pre-Procedure Review  Request Date: Monday 06/04/19 Requesting Physician: Dr. Vicente Males  PATIENT REVIEW QUESTIONS: The patient responded to the following health history questions as indicated:    1. Are you having any GI issues? no 2. Do you have a personal history of Polyps? no 3. Do you have a family history of Colon Cancer or Polyps? yes (Mother colon polyps) 4. Diabetes Mellitus? no 5. Joint replacements in the past 12 months?Foot surgery October 19th 2020 6. Major health problems in the past 3 months?no 7. Any artificial heart valves, MVP, or defibrillator?no    MEDICATIONS & ALLERGIES:    Patient reports the following regarding taking any anticoagulation/antiplatelet therapy:   Plavix, Coumadin, Eliquis, Xarelto, Lovenox, Pradaxa, Brilinta, or Effient? no Aspirin? no  Patient confirms/reports the following medications:  Current Outpatient Medications  Medication Sig Dispense Refill  . diclofenac (VOLTAREN) 75 MG EC tablet TAKE 1 TABLET BY MOUTH TWICE A DAY 50 tablet 0  . DYMISTA 137-50 MCG/ACT SUSP     . EPIPEN 2-PAK 0.3 MG/0.3ML SOAJ injection     . levocetirizine (XYZAL) 5 MG tablet Take 2.5 mg by mouth.     . montelukast (SINGULAIR) 10 MG tablet     . Multiple Vitamins-Minerals (MULTIVITAMIN GUMMIES ADULT PO) Take 2 each by mouth daily.    Marland Kitchen omeprazole (PRILOSEC) 20 MG capsule Take 1 capsule (20 mg total) by mouth daily. 30 capsule 11  . spironolactone (ALDACTONE) 50 MG tablet TAKE 1 TABLET (50 MG TOTAL) BY MOUTH DAILY. 30 tablet 11   No current facility-administered medications for this visit.     Patient confirms/reports the following allergies:  Allergies  Allergen Reactions  . Dust Mite Extract Cough and Itching  . Fish Allergy Hives    *CATFISH*  . Other     artificial sweeteners  . Shrimp Extract Allergy Skin Test Itching and Swelling  . Sulfa Antibiotics Rash    No orders of the defined types were placed in this encounter.   AUTHORIZATION  INFORMATION Primary Insurance: 1D#: Group #:  Secondary Insurance: 1D#: Group #:  SCHEDULE INFORMATION: Date: 11/30/20Time: Location:[facility]

## 2019-04-20 LAB — CYTOLOGY - PAP: Diagnosis: NEGATIVE

## 2019-04-22 ENCOUNTER — Other Ambulatory Visit: Payer: Self-pay | Admitting: Sports Medicine

## 2019-04-22 DIAGNOSIS — Z9889 Other specified postprocedural states: Secondary | ICD-10-CM

## 2019-04-22 NOTE — Progress Notes (Signed)
Post op meds entered 

## 2019-04-23 ENCOUNTER — Encounter: Payer: Self-pay | Admitting: Sports Medicine

## 2019-04-23 DIAGNOSIS — Q6651 Congenital pes planus, right foot: Secondary | ICD-10-CM | POA: Diagnosis not present

## 2019-04-23 DIAGNOSIS — M76821 Posterior tibial tendinitis, right leg: Secondary | ICD-10-CM | POA: Diagnosis not present

## 2019-04-23 DIAGNOSIS — M65871 Other synovitis and tenosynovitis, right ankle and foot: Secondary | ICD-10-CM | POA: Diagnosis not present

## 2019-04-23 DIAGNOSIS — M6701 Short Achilles tendon (acquired), right ankle: Secondary | ICD-10-CM | POA: Diagnosis not present

## 2019-04-23 MED ORDER — IBUPROFEN 800 MG PO TABS: 800.0000 mg | ORAL_TABLET | Freq: Three times a day (TID) | ORAL | 0 refills | Status: DC | PRN

## 2019-04-23 MED ORDER — HYDROCODONE-ACETAMINOPHEN 10-325 MG PO TABS: 1.0000 | ORAL_TABLET | Freq: Four times a day (QID) | ORAL | 0 refills | Status: AC | PRN

## 2019-04-23 MED ORDER — PROMETHAZINE HCL 25 MG PO TABS: 25.0000 mg | ORAL_TABLET | Freq: Three times a day (TID) | ORAL | 0 refills | Status: DC | PRN

## 2019-04-23 MED ORDER — DOCUSATE SODIUM 100 MG PO CAPS: 100.0000 mg | ORAL_CAPSULE | Freq: Two times a day (BID) | ORAL | 0 refills | Status: DC

## 2019-04-23 NOTE — Progress Notes (Signed)
Performed 04/23/19 at [facility] by Dr. Cannon Kettle and Dr. Posey Pronto assisting

## 2019-04-24 ENCOUNTER — Telehealth: Payer: Self-pay | Admitting: Sports Medicine

## 2019-04-24 NOTE — Telephone Encounter (Signed)
Post op check phone call made to patient. Patient reports that her leg is still numb but there is starting to be a little bit of tingling. Patient also admits to a small dry spot of blood on her dressing but no other active areas of bleeding or problems. Patient asked about her knee scooter. I advised patient that she should be getting a phone call today, if she does not to follow up with our surgical coordinator, patient expressed understanding. -Dr. Cannon Kettle

## 2019-04-25 ENCOUNTER — Telehealth: Payer: Self-pay | Admitting: *Deleted

## 2019-04-25 DIAGNOSIS — H1045 Other chronic allergic conjunctivitis: Secondary | ICD-10-CM | POA: Diagnosis not present

## 2019-04-25 DIAGNOSIS — M76821 Posterior tibial tendinitis, right leg: Secondary | ICD-10-CM | POA: Diagnosis not present

## 2019-04-25 DIAGNOSIS — J3089 Other allergic rhinitis: Secondary | ICD-10-CM | POA: Diagnosis not present

## 2019-04-25 DIAGNOSIS — R05 Cough: Secondary | ICD-10-CM | POA: Diagnosis not present

## 2019-04-25 DIAGNOSIS — M7661 Achilles tendinitis, right leg: Secondary | ICD-10-CM | POA: Diagnosis not present

## 2019-04-25 DIAGNOSIS — K219 Gastro-esophageal reflux disease without esophagitis: Secondary | ICD-10-CM | POA: Diagnosis not present

## 2019-04-25 NOTE — Telephone Encounter (Signed)
"  I still haven't received my knee scooter.  Do you know how I can go about getting one?"  I got a message this morning.  I think they have been trying to contact you.  The message says your order is at the retail store.  "Where do I go to get it?  Will I have to be there to pick it up?"  It's [place], previously [place], I think the address is [address], [place].  I don't think you need to be there to pick it up, someone else can go for you.  Let me contact Jolee Ewing and I will make sure I'm giving you the correct information.  Is it okay if I call you back so you will not be on hold for a long time?  "Yes, that will be fine."  I am returning your call.  The address I gave you of [address] is correct for [employer].  Their phone number is [phone].  "Did they say if I needed to be there to pick it up?"  I did not ask that question but you can ask when you call them.  "Thank you so much for your help."

## 2019-05-01 ENCOUNTER — Other Ambulatory Visit: Payer: Self-pay | Admitting: Family Medicine

## 2019-05-01 DIAGNOSIS — Z1231 Encounter for screening mammogram for malignant neoplasm of breast: Secondary | ICD-10-CM

## 2019-05-03 ENCOUNTER — Other Ambulatory Visit: Payer: Self-pay | Admitting: Sports Medicine

## 2019-05-03 ENCOUNTER — Ambulatory Visit (INDEPENDENT_AMBULATORY_CARE_PROVIDER_SITE_OTHER): Payer: BC Managed Care – PPO

## 2019-05-03 ENCOUNTER — Ambulatory Visit (INDEPENDENT_AMBULATORY_CARE_PROVIDER_SITE_OTHER): Payer: BC Managed Care – PPO | Admitting: Sports Medicine

## 2019-05-03 ENCOUNTER — Other Ambulatory Visit: Payer: Self-pay

## 2019-05-03 ENCOUNTER — Encounter: Payer: Self-pay | Admitting: Sports Medicine

## 2019-05-03 DIAGNOSIS — Z9889 Other specified postprocedural states: Secondary | ICD-10-CM

## 2019-05-03 DIAGNOSIS — M76821 Posterior tibial tendinitis, right leg: Secondary | ICD-10-CM

## 2019-05-03 DIAGNOSIS — M7661 Achilles tendinitis, right leg: Secondary | ICD-10-CM

## 2019-05-03 DIAGNOSIS — M79671 Pain in right foot: Secondary | ICD-10-CM

## 2019-05-03 NOTE — Progress Notes (Signed)
Subjective: Ashley Mercado is a 48 y.o. female patient seen today in office for POV #1 (DOS 04/23/2019), S/P right posterior tibial tendon debridement with Kidner and tendo Achilles lengthening. Patient admits some pain at surgical site but most pain at calf after she fell and bumped her leg reports that the pain is relieved with rest ice and her ibuprofen reports that sometimes when she takes the hydrocodone it makes her nauseous but the antinausea meds are helping.  Patient reports that she feels some tightness and itching sensation around the staples but otherwise doing okay.  No other issues noted.   Patient Active Problem List   Diagnosis Date Noted  . Colon cancer screening 04/17/2019  . Gastritis 09/12/2018  . Prediabetes 04/06/2017  . Carpal tunnel syndrome 11/10/2016  . Allergic rhinitis 11/10/2016  . Encounter for routine gynecological examination 03/10/2016  . Screening for HIV (human immunodeficiency virus) 03/07/2015  . Essential hypertension 10/28/2014  . PCOS (polycystic ovarian syndrome) 10/28/2014  . Stress reaction 03/01/2014  . Other screening mammogram 02/18/2012  . Morbid obesity ([place]) 10/20/2011  . Elevated blood-pressure reading without diagnosis of hypertension 10/20/2011  . Routine general medical examination at a health care facility 02/06/2011  . Depression 11/04/2010  . HIDRADENITIS SUPPURATIVA 05/12/2010  . 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 Elevated platelet count 11/30/2006  . KELOID SCAR 11/29/2006  . FOLLICULITIS [id number]    Current Outpatient Medications on File Prior to Visit  Medication Sig Dispense Refill  . diclofenac (VOLTAREN) 75 MG EC tablet TAKE 1 TABLET BY MOUTH TWICE A DAY 50 tablet 0  . docusate sodium (COLACE) 100 MG capsule Take 1 capsule (100 mg total) by mouth 2 (two) times daily. 10 capsule 0  . DYMISTA 137-50 MCG/ACT SUSP     . EPIPEN 2-PAK 0.3 MG/0.3ML SOAJ injection     . ibuprofen (ADVIL) 800 MG tablet Take 1 tablet (800 mg total) by mouth every  8 (eight) hours as needed. 30 tablet 0  . levocetirizine (XYZAL) 5 MG tablet Take 2.5 mg by mouth.     . montelukast (SINGULAIR) 10 MG tablet     . Multiple Vitamins-Minerals (MULTIVITAMIN GUMMIES ADULT PO) Take 2 each by mouth daily.    Marland Kitchen omeprazole (PRILOSEC) 20 MG capsule Take 1 capsule (20 mg total) by mouth daily. 30 capsule 11  . promethazine (PHENERGAN) 25 MG tablet Take 1 tablet (25 mg total) by mouth every 8 (eight) hours as needed for nausea or vomiting. 20 tablet 0  . spironolactone (ALDACTONE) 50 MG tablet TAKE 1 TABLET (50 MG TOTAL) BY MOUTH DAILY. 30 tablet 11   No current facility-administered medications on file prior to visit.     Allergies  Allergen Reactions  . Dust Mite Extract Cough and Itching  . Fish Allergy Hives    *CATFISH*  . Other     artificial sweeteners  . Shrimp Extract Allergy Skin Test Itching and Swelling  . Sulfa Antibiotics Rash    Objective: There were no vitals filed for this visit.  General: No acute distress, AAOx3  Right foot: Staples and sutures intact with no gapping or dehiscence at surgical site, mild swelling to right foot and lower leg, no erythema, no warmth, no drainage, no signs of infection noted, Capillary fill time <3 seconds in all digits, gross sensation present via light touch to right foot.  No pain or crepitation with range of motion right foot.  Mild pain with calf compression but no signs of DVT.   Post Op Xray,  Right foot, hardware intact. Soft tissue swelling within normal limits for post op status.   Assessment and Plan:  Problem List Items Addressed This Visit    None    Visit Diagnoses    S/P foot surgery, right    -  Primary   Posterior tibial tendinitis of right leg       Tendonitis, Achilles, right       Right foot pain           -Patient seen and evaluated -X-rays reviewed -Applied Unna boot and dry sterile dressing to surgical site right foot secured with ACE wrap and stockinet  -Advised patient to  make sure to keep dressings clean, dry, and intact to right surgical site -Advised patient to continue with the boot even when she is on the scooter to prevent from falling and causing trauma to her surgical leg -Continue withPRN meds -Advised patient to limit activity to necessity  -Advised patient to ice and elevate as necessary  -Will plan for possible staple and suture removal at next office visit. In the meantime, patient to call office if any issues or problems arise.   Landis Martins, DPM

## 2019-05-08 ENCOUNTER — Encounter: Payer: Self-pay | Admitting: Sports Medicine

## 2019-05-08 ENCOUNTER — Other Ambulatory Visit: Payer: Self-pay

## 2019-05-08 ENCOUNTER — Ambulatory Visit (INDEPENDENT_AMBULATORY_CARE_PROVIDER_SITE_OTHER): Payer: Self-pay | Admitting: Sports Medicine

## 2019-05-08 DIAGNOSIS — Z9889 Other specified postprocedural states: Secondary | ICD-10-CM

## 2019-05-08 DIAGNOSIS — M79671 Pain in right foot: Secondary | ICD-10-CM

## 2019-05-08 DIAGNOSIS — M76821 Posterior tibial tendinitis, right leg: Secondary | ICD-10-CM

## 2019-05-08 DIAGNOSIS — M7661 Achilles tendinitis, right leg: Secondary | ICD-10-CM

## 2019-05-08 MED ORDER — IBUPROFEN 800 MG PO TABS: 800.0000 mg | ORAL_TABLET | Freq: Three times a day (TID) | ORAL | 0 refills | Status: DC | PRN

## 2019-05-08 NOTE — Progress Notes (Signed)
Subjective: Ashley Mercado is a 48 y.o. female patient seen today in office for POV #2 (DOS 04/23/2019), S/P right posterior tibial tendon debridement with Kidner and tendo Achilles lengthening. Patient admits some pain at surgical site but controlled with Motrin.  Patient denies any other pedal complaints except some swelling.  No other issues noted.   Patient Active Problem List   Diagnosis Date Noted  . Colon cancer screening 04/17/2019  . Gastritis 09/12/2018  . Prediabetes 04/06/2017  . Carpal tunnel syndrome 11/10/2016  . Allergic rhinitis 11/10/2016  . Encounter for routine gynecological examination 03/10/2016  . Screening for HIV (human immunodeficiency virus) 03/07/2015  . Essential hypertension 10/28/2014  . PCOS (polycystic ovarian syndrome) 10/28/2014  . Stress reaction 03/01/2014  . Other screening mammogram 02/18/2012  . Morbid obesity ([place]) 10/20/2011  . Elevated blood-pressure reading without diagnosis of hypertension 10/20/2011  . Routine general medical examination at a health care facility 02/06/2011  . Depression 11/04/2010  . HIDRADENITIS SUPPURATIVA 05/12/2010  . 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 Elevated platelet count 11/30/2006  . KELOID SCAR 11/29/2006  . FOLLICULITIS 75/64/3329    Current Outpatient Medications on File Prior to Visit  Medication Sig Dispense Refill  . diclofenac (VOLTAREN) 75 MG EC tablet TAKE 1 TABLET BY MOUTH TWICE A DAY 50 tablet 0  . docusate sodium (COLACE) 100 MG capsule Take 1 capsule (100 mg total) by mouth 2 (two) times daily. 10 capsule 0  . DYMISTA 137-50 MCG/ACT SUSP     . EPIPEN 2-PAK 0.3 MG/0.3ML SOAJ injection     . levocetirizine (XYZAL) 5 MG tablet Take 2.5 mg by mouth.     . montelukast (SINGULAIR) 10 MG tablet     . Multiple Vitamins-Minerals (MULTIVITAMIN GUMMIES ADULT PO) Take 2 each by mouth daily.    Marland Kitchen omeprazole (PRILOSEC) 20 MG capsule Take 1 capsule (20 mg total) by mouth daily. 30 capsule 11  . promethazine (PHENERGAN) 25 MG  tablet Take 1 tablet (25 mg total) by mouth every 8 (eight) hours as needed for nausea or vomiting. 20 tablet 0  . spironolactone (ALDACTONE) 50 MG tablet TAKE 1 TABLET (50 MG TOTAL) BY MOUTH DAILY. 30 tablet 11  . SUPREP BOWEL PREP KIT 17.5-3.13-1.6 GM/177ML SOLN See admin instructions.     No current facility-administered medications on file prior to visit.     Allergies  Allergen Reactions  . Dust Mite Extract Cough and Itching  . Fish Allergy Hives    *CATFISH*  . Other     artificial sweeteners  . Shrimp Extract Allergy Skin Test Itching and Swelling  . Sulfa Antibiotics Rash    Objective: There were no vitals filed for this visit.  General: No acute distress, AAOx3  Right foot: Staples and sutures intact with no gapping or dehiscence at surgical site, mild swelling to right foot and lower leg, no erythema, no warmth, no drainage, no signs of infection noted, Capillary fill time <3 seconds in all digits, gross sensation present via light touch to right foot.  No pain or crepitation with range of motion right foot.  Mild pain with calf compression but no signs of DVT.   Assessment and Plan:  Problem List Items Addressed This Visit    None    Visit Diagnoses    S/P foot surgery, right    -  Primary   Posterior tibial tendinitis of right leg       Tendonitis, Achilles, right       Right foot pain           -  Patient seen and evaluated -A few sutures removed at the calf and then applied dry sterile dressing -Advised patient to make sure to keep dressings clean, dry, and intact to right surgical site -Advised patient to continue with the boot to protect foot even when on use of scooter -Continue with PRN meds refilled ibuprofen -Advised patient to limit activity to necessity  -Advised patient to ice and elevate as necessary  -Advised patient that we will continue with no work may have to adjust return to work may not be ready to return on December 1. -Will plan for possible  staple removal at next office visit. In the meantime, patient to call office if any issues or problems arise.   Landis Martins, DPM

## 2019-05-10 ENCOUNTER — Encounter: Payer: BC Managed Care – PPO | Admitting: Sports Medicine

## 2019-05-15 ENCOUNTER — Other Ambulatory Visit: Payer: Self-pay

## 2019-05-15 ENCOUNTER — Encounter: Payer: Self-pay | Admitting: Sports Medicine

## 2019-05-15 ENCOUNTER — Ambulatory Visit (INDEPENDENT_AMBULATORY_CARE_PROVIDER_SITE_OTHER): Payer: Self-pay | Admitting: Sports Medicine

## 2019-05-15 DIAGNOSIS — M7661 Achilles tendinitis, right leg: Secondary | ICD-10-CM

## 2019-05-15 DIAGNOSIS — M76821 Posterior tibial tendinitis, right leg: Secondary | ICD-10-CM

## 2019-05-15 DIAGNOSIS — M79671 Pain in right foot: Secondary | ICD-10-CM

## 2019-05-15 DIAGNOSIS — Z9889 Other specified postprocedural states: Secondary | ICD-10-CM

## 2019-05-15 NOTE — Progress Notes (Signed)
Subjective: Ashley Mercado is a 48 y.o. female patient seen today in office for POV #3 (DOS 04/23/2019), S/P right posterior tibial tendon debridement with Kidner and tendo Achilles lengthening. Patient admits some pain at surgical site and numbness at toes and leg. Reports that she feels pain at incision 5/10 but controlled with Motrin.  Patient denies any other pedal complaints except some swelling.  No other issues noted.   Patient Active Problem List   Diagnosis Date Noted  . Colon cancer screening 04/17/2019  . Gastritis 09/12/2018  . Prediabetes 04/06/2017  . Carpal tunnel syndrome 11/10/2016  . Allergic rhinitis 11/10/2016  . Encounter for routine gynecological examination 03/10/2016  . Screening for HIV (human immunodeficiency virus) 03/07/2015  . Essential hypertension 10/28/2014  . PCOS (polycystic ovarian syndrome) 10/28/2014  . Stress reaction 03/01/2014  . Other screening mammogram 02/18/2012  . Morbid obesity ([place]) 10/20/2011  . Elevated blood-pressure reading without diagnosis of hypertension 10/20/2011  . Routine general medical examination at a health care facility 02/06/2011  . Depression 11/04/2010  . HIDRADENITIS SUPPURATIVA 05/12/2010  . 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 Elevated platelet count 11/30/2006  . KELOID SCAR 11/29/2006  . FOLLICULITIS 37/54/3606    Current Outpatient Medications on File Prior to Visit  Medication Sig Dispense Refill  . diclofenac (VOLTAREN) 75 MG EC tablet TAKE 1 TABLET BY MOUTH TWICE A DAY 50 tablet 0  . docusate sodium (COLACE) 100 MG capsule Take 1 capsule (100 mg total) by mouth 2 (two) times daily. 10 capsule 0  . DYMISTA 137-50 MCG/ACT SUSP     . EPIPEN 2-PAK 0.3 MG/0.3ML SOAJ injection     . ibuprofen (ADVIL) 800 MG tablet Take 1 tablet (800 mg total) by mouth every 8 (eight) hours as needed. 30 tablet 0  . levocetirizine (XYZAL) 5 MG tablet Take 2.5 mg by mouth.     . montelukast (SINGULAIR) 10 MG tablet     . Multiple Vitamins-Minerals  (MULTIVITAMIN GUMMIES ADULT PO) Take 2 each by mouth daily.    Marland Kitchen omeprazole (PRILOSEC) 20 MG capsule Take 1 capsule (20 mg total) by mouth daily. 30 capsule 11  . promethazine (PHENERGAN) 25 MG tablet Take 1 tablet (25 mg total) by mouth every 8 (eight) hours as needed for nausea or vomiting. 20 tablet 0  . spironolactone (ALDACTONE) 50 MG tablet TAKE 1 TABLET (50 MG TOTAL) BY MOUTH DAILY. 30 tablet 11  . SUPREP BOWEL PREP KIT 17.5-3.13-1.6 GM/177ML SOLN See admin instructions.     No current facility-administered medications on file prior to visit.     Allergies  Allergen Reactions  . Dust Mite Extract Cough and Itching  . Fish Allergy Hives    *CATFISH*  . Other     artificial sweeteners  . Shrimp Extract Allergy Skin Test Itching and Swelling  . Sulfa Antibiotics Rash    Objective: There were no vitals filed for this visit.  General: No acute distress, AAOx3  Right foot: Staples and sutures intact with no gapping or dehiscence at surgical site, mild swelling to right foot and lower leg, no erythema, no warmth, no drainage, no signs of infection noted, Capillary fill time <3 seconds in all digits, gross sensation present via light touch to right foot.  No pain or crepitation with range of motion right foot.  Mild pain with calf compression but no signs of DVT.   Assessment and Plan:  Problem List Items Addressed This Visit    None    Visit Diagnoses  S/P foot surgery, right    -  Primary   Posterior tibial tendinitis of right leg       Tendonitis, Achilles, right       Right foot pain         -Patient seen and evaluated -A few Staples removed and then applied dry sterile dressing -Advised patient to make sure to keep dressings clean, dry, and intact to right surgical site -Advised patient to continue with the boot to protect foot even when on use of scooter -Continue with PRN meds -Advised patient to limit activity to necessity  -Advised patient to ice and elevate as  necessary  -Advised patient that we will continue with no work may have to adjust return to work may not be ready to return on December 1 like before. -Will plan for xrays and finishing staple removal at next office visit. In the meantime, patient to call office if any issues or problems arise.   Landis Martins, DPM

## 2019-05-17 DIAGNOSIS — K219 Gastro-esophageal reflux disease without esophagitis: Secondary | ICD-10-CM | POA: Diagnosis not present

## 2019-05-17 DIAGNOSIS — R05 Cough: Secondary | ICD-10-CM | POA: Diagnosis not present

## 2019-05-17 DIAGNOSIS — J3089 Other allergic rhinitis: Secondary | ICD-10-CM | POA: Diagnosis not present

## 2019-05-17 DIAGNOSIS — H1045 Other chronic allergic conjunctivitis: Secondary | ICD-10-CM | POA: Diagnosis not present

## 2019-05-26 DIAGNOSIS — M7661 Achilles tendinitis, right leg: Secondary | ICD-10-CM | POA: Diagnosis not present

## 2019-05-26 DIAGNOSIS — M76821 Posterior tibial tendinitis, right leg: Secondary | ICD-10-CM | POA: Diagnosis not present

## 2019-05-29 ENCOUNTER — Ambulatory Visit (INDEPENDENT_AMBULATORY_CARE_PROVIDER_SITE_OTHER): Payer: BC Managed Care – PPO

## 2019-05-29 ENCOUNTER — Other Ambulatory Visit: Payer: Self-pay

## 2019-05-29 ENCOUNTER — Encounter: Payer: Self-pay | Admitting: Sports Medicine

## 2019-05-29 ENCOUNTER — Ambulatory Visit (INDEPENDENT_AMBULATORY_CARE_PROVIDER_SITE_OTHER): Payer: BC Managed Care – PPO | Admitting: Sports Medicine

## 2019-05-29 DIAGNOSIS — M76821 Posterior tibial tendinitis, right leg: Secondary | ICD-10-CM

## 2019-05-29 DIAGNOSIS — Z9889 Other specified postprocedural states: Secondary | ICD-10-CM

## 2019-05-29 MED ORDER — IBUPROFEN 800 MG PO TABS: 800.0000 mg | ORAL_TABLET | Freq: Three times a day (TID) | ORAL | 0 refills | Status: DC | PRN

## 2019-05-29 NOTE — Progress Notes (Signed)
Subjective: Ashley Mercado is a 48 y.o. female patient seen today in office for POV #4 (DOS 04/23/2019), S/P right posterior tibial tendon debridement with Kidner and tendo Achilles lengthening. Patient admits some pain at surgical site that is worse when she hasn't taken motrin. Reports that she feels pain at incision 2/10.   Patient denies any other pedal complaints except some swelling.  No other issues noted.   Patient Active Problem List   Diagnosis Date Noted  . Colon cancer screening 04/17/2019  . Gastritis 09/12/2018  . Prediabetes 04/06/2017  . Carpal tunnel syndrome 11/10/2016  . Allergic rhinitis 11/10/2016  . Encounter for routine gynecological examination 03/10/2016  . Screening for HIV (human immunodeficiency virus) 03/07/2015  . Essential hypertension 10/28/2014  . PCOS (polycystic ovarian syndrome) 10/28/2014  . Stress reaction 03/01/2014  . Other screening mammogram 02/18/2012  . Morbid obesity ([place]) 10/20/2011  . Elevated blood-pressure reading without diagnosis of hypertension 10/20/2011  . Routine general medical examination at a health care facility 02/06/2011  . Depression 11/04/2010  . HIDRADENITIS SUPPURATIVA 05/12/2010  . 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 Elevated platelet count 11/30/2006  . KELOID SCAR 11/29/2006  . FOLLICULITIS 49/17/9150    Current Outpatient Medications on File Prior to Visit  Medication Sig Dispense Refill  . docusate sodium (COLACE) 100 MG capsule Take 1 capsule (100 mg total) by mouth 2 (two) times daily. 10 capsule 0  . DYMISTA 137-50 MCG/ACT SUSP     . EPIPEN 2-PAK 0.3 MG/0.3ML SOAJ injection     . ibuprofen (ADVIL) 800 MG tablet Take 1 tablet (800 mg total) by mouth every 8 (eight) hours as needed. 30 tablet 0  . levocetirizine (XYZAL) 5 MG tablet Take 2.5 mg by mouth.     . montelukast (SINGULAIR) 10 MG tablet     . Multiple Vitamins-Minerals (MULTIVITAMIN GUMMIES ADULT PO) Take 2 each by mouth daily.    Marland Kitchen omeprazole (PRILOSEC) 20 MG capsule  Take 1 capsule (20 mg total) by mouth daily. 30 capsule 11  . promethazine (PHENERGAN) 25 MG tablet Take 1 tablet (25 mg total) by mouth every 8 (eight) hours as needed for nausea or vomiting. 20 tablet 0  . spironolactone (ALDACTONE) 50 MG tablet TAKE 1 TABLET (50 MG TOTAL) BY MOUTH DAILY. 30 tablet 11  . SUPREP BOWEL PREP KIT 17.5-3.13-1.6 GM/177ML SOLN See admin instructions.     No current facility-administered medications on file prior to visit.     Allergies  Allergen Reactions  . Dust Mite Extract Cough and Itching  . Fish Allergy Hives    *CATFISH*  . Other     artificial sweeteners  . Shrimp Extract Allergy Skin Test Itching and Swelling  . Sulfa Antibiotics Rash    Objective: There were no vitals filed for this visit.  General: No acute distress, AAOx3  Right foot: Staples intact with no gapping or dehiscence at surgical site, mild swelling to right foot and lower leg, no erythema, no warmth, no drainage, no signs of infection noted, Capillary fill time <3 seconds in all digits, gross sensation present via light touch to right foot.  No pain or crepitation with range of motion right foot.  Mild pain with calf compression but no signs of DVT.   Xrays: consistent with post op status   Assessment and Plan:  Problem List Items Addressed This Visit    None    Visit Diagnoses    Posterior tibial tendinitis of right leg    -  Primary  Relevant Orders   DG Foot Complete Right   S/P foot surgery, right         -Patient seen and evaluated -All staples removed -Dry dressing applied may remove on next week for shower and re-apply ace wrap as needed for swelling  -Continue with PRN meds especially Ibuprofen for pain control refilled this visit -Advised patient to limit activity to necessity  -Advised patient to ice and elevate as necessary  -May weightbear with CAM boot and may use scooter for longer distances -Conitnue with no work expected return 6 weeks 07-24-19 -Will  plan for surgical site check and adding on PT if needed at next office visit. In the meantime, patient to call office if any issues or problems arise.   Landis Martins, DPM

## 2019-06-01 ENCOUNTER — Other Ambulatory Visit: Payer: Self-pay

## 2019-06-01 ENCOUNTER — Other Ambulatory Visit
Admission: RE | Admit: 2019-06-01 | Discharge: 2019-06-01 | Disposition: A | Discharge status: Home / Self Care | Payer: BC Managed Care – PPO | Source: Ambulatory Visit | Attending: Gastroenterology | Admitting: Gastroenterology

## 2019-06-01 DIAGNOSIS — Z20828 Contact with and (suspected) exposure to other viral communicable diseases: Secondary | ICD-10-CM | POA: Insufficient documentation

## 2019-06-01 DIAGNOSIS — Z01812 Encounter for preprocedural laboratory examination: Secondary | ICD-10-CM | POA: Diagnosis not present

## 2019-06-01 LAB — SARS CORONAVIRUS 2 (TAT 6-24 HRS): SARS Coronavirus 2: NEGATIVE

## 2019-06-04 ENCOUNTER — Other Ambulatory Visit: Payer: Self-pay

## 2019-06-04 ENCOUNTER — Encounter
Admission: RE | Disposition: A | Discharge status: Home / Self Care | Payer: Self-pay | Source: Home / Self Care | Attending: Gastroenterology

## 2019-06-04 ENCOUNTER — Ambulatory Visit: Payer: BC Managed Care – PPO | Admitting: Anesthesiology

## 2019-06-04 ENCOUNTER — Ambulatory Visit
Admission: RE | Admit: 2019-06-04 | Discharge: 2019-06-04 | Disposition: A | Discharge status: Home / Self Care | Payer: BC Managed Care – PPO | Attending: Gastroenterology | Admitting: Gastroenterology

## 2019-06-04 DIAGNOSIS — F329 Major depressive disorder, single episode, unspecified: Secondary | ICD-10-CM | POA: Insufficient documentation

## 2019-06-04 DIAGNOSIS — D125 Benign neoplasm of sigmoid colon: Secondary | ICD-10-CM | POA: Insufficient documentation

## 2019-06-04 DIAGNOSIS — K635 Polyp of colon: Secondary | ICD-10-CM | POA: Diagnosis not present

## 2019-06-04 DIAGNOSIS — Z8371 Family history of colonic polyps: Secondary | ICD-10-CM | POA: Insufficient documentation

## 2019-06-04 DIAGNOSIS — D123 Benign neoplasm of transverse colon: Secondary | ICD-10-CM | POA: Diagnosis not present

## 2019-06-04 DIAGNOSIS — Z8719 Personal history of other diseases of the digestive system: Secondary | ICD-10-CM | POA: Insufficient documentation

## 2019-06-04 DIAGNOSIS — Z1211 Encounter for screening for malignant neoplasm of colon: Secondary | ICD-10-CM

## 2019-06-04 DIAGNOSIS — Z8249 Family history of ischemic heart disease and other diseases of the circulatory system: Secondary | ICD-10-CM | POA: Insufficient documentation

## 2019-06-04 DIAGNOSIS — Z79899 Other long term (current) drug therapy: Secondary | ICD-10-CM | POA: Diagnosis not present

## 2019-06-04 DIAGNOSIS — I10 Essential (primary) hypertension: Secondary | ICD-10-CM | POA: Diagnosis not present

## 2019-06-04 DIAGNOSIS — F419 Anxiety disorder, unspecified: Secondary | ICD-10-CM | POA: Insufficient documentation

## 2019-06-04 DIAGNOSIS — D126 Benign neoplasm of colon, unspecified: Secondary | ICD-10-CM | POA: Diagnosis not present

## 2019-06-04 HISTORY — PX: COLONOSCOPY WITH PROPOFOL: SHX5780

## 2019-06-04 SURGERY — COLONOSCOPY WITH PROPOFOL
Anesthesia: General

## 2019-06-04 MED ORDER — PROPOFOL 10 MG/ML IV BOLUS
INTRAVENOUS | Status: DC | PRN
  Administered 2019-06-04: 40 mg via INTRAVENOUS
  Administered 2019-06-04: 10 mg via INTRAVENOUS
  Administered 2019-06-04: 50 mg via INTRAVENOUS

## 2019-06-04 MED ORDER — SODIUM CHLORIDE 0.9 % IV SOLN
INTRAVENOUS | Status: DC
  Administered 2019-06-04: 09:00:00 via INTRAVENOUS

## 2019-06-04 MED ORDER — PHENYLEPHRINE HCL (PRESSORS) 10 MG/ML IV SOLN: INTRAVENOUS | Status: DC | PRN

## 2019-06-04 MED ORDER — PROPOFOL 500 MG/50ML IV EMUL
INTRAVENOUS | Status: DC | PRN
  Administered 2019-06-04: 180 ug/kg/min via INTRAVENOUS

## 2019-06-04 MED ORDER — PHENYLEPHRINE HCL (PRESSORS) 10 MG/ML IV SOLN
INTRAVENOUS | Status: DC | PRN
  Administered 2019-06-04: 100 ug via INTRAVENOUS

## 2019-06-04 MED ORDER — PROPOFOL 500 MG/50ML IV EMUL
INTRAVENOUS | Status: AC
  Filled 2019-06-04: qty 50

## 2019-06-04 NOTE — Anesthesia Postprocedure Evaluation (Signed)
Anesthesia Post Note  Patient: Ashley Mercado  Procedure(s) Performed: COLONOSCOPY WITH PROPOFOL (N/A )  Patient location during evaluation: PACU Anesthesia Type: General Level of consciousness: awake and alert Pain management: pain level controlled Vital Signs Assessment: post-procedure vital signs reviewed and stable Respiratory status: spontaneous breathing, nonlabored ventilation and respiratory function stable Cardiovascular status: blood pressure returned to baseline and stable Postop Assessment: no apparent nausea or vomiting Anesthetic complications: no     Last Vitals:  Vitals:   06/04/19 1038 06/04/19 1045  BP: 128/88 122/87  Pulse:    Resp:    Temp:    SpO2:      Last Pain:  Vitals:   06/04/19 1038  TempSrc:   PainSc: 0-No pain                 Durenda Hurt

## 2019-06-04 NOTE — Op Note (Signed)
[facility] Gastroenterology Patient Name: Ashley Mercado Procedure Date: 06/04/2019 9:38 AM MRN: [id number] Account #: [id number] Date of Birth: Nov 21, 1970 Admit Type: Outpatient Age: 48 Room: [facility] ENDO ROOM 4 Gender: Female Note Status: Finalized Procedure:             Colonoscopy Indications:           Colon cancer screening in patient at increased risk:                         Family history of 1st-degree relative with colon polyps Providers:             Jonathon Bellows MD, MD Medicines:             Monitored Anesthesia Care Complications:         No immediate complications. Procedure:             Pre-Anesthesia Assessment:                        - Prior to the procedure, a History and Physical was                         performed, and patient medications, allergies and                         sensitivities were reviewed. The patient's tolerance                         of previous anesthesia was reviewed.                        - The risks and benefits of the procedure and the                         sedation options and risks were discussed with the                         patient. All questions were answered and informed                         consent was obtained.                        - ASA Grade Assessment: II - A patient with mild                         systemic disease.                        After obtaining informed consent, the colonoscope was                         passed under direct vision. Throughout the procedure,                         the patient's blood pressure, pulse, and oxygen                         saturations were monitored continuously. The  Colonoscope was introduced through the anus and                         advanced to the the cecum, identified by the                         appendiceal orifice. The colonoscopy was performed                         with ease. The patient tolerated the procedure well.                     The quality of the bowel preparation was excellent. Findings:      The perianal and digital rectal examinations were normal.      A 3 mm polyp was found in the cecum. The polyp was sessile. The polyp       was removed with a cold biopsy forceps. Resection and retrieval were       complete.      Two sessile polyps were found in the transverse colon. The polyps were 7       to 10 mm in size. These polyps were removed with a cold snare. Resection       and retrieval were complete.      A 10 mm polyp was found in the sigmoid colon. The polyp was       semi-pedunculated. The polyp was removed with a cold snare. Resection       and retrieval were complete. To prevent bleeding after the polypectomy,       one hemostatic clip was successfully placed. There was no bleeding       during, or at the end, of the procedure.      The exam was otherwise without abnormality on direct and retroflexion       views. Impression:            - One 3 mm polyp in the cecum, removed with a cold                         biopsy forceps. Resected and retrieved.                        - Two 7 to 10 mm polyps in the transverse colon,                         removed with a cold snare. Resected and retrieved.                        - One 10 mm polyp in the sigmoid colon, removed with a                         cold snare. Resected and retrieved. Clip was placed.                        - The examination was otherwise normal on direct and                         retroflexion views. Recommendation:        - Discharge patient to home (with escort).                        -  Resume previous diet.                        - Continue present medications.                        - Await pathology results.                        - Repeat colonoscopy in 3 years for surveillance. Procedure Code(s):     --- Professional ---                        [phone], Colonoscopy, flexible; with removal of                          tumor(s), polyp(s), or other lesion(s) by snare                         technique                        [id number], 30, Colonoscopy, flexible; with biopsy, single                         or multiple Diagnosis Code(s):     --- Professional ---                        K63.5, Polyp of colon                        Z83.71, Family history of colonic polyps CPT copyright 2019 American Medical Association. All rights reserved. The codes documented in this report are preliminary and upon coder review may  be revised to meet current compliance requirements. Jonathon Bellows, MD Jonathon Bellows MD, MD 06/04/2019 10:14:52 AM This report has been signed electronically. Number of Addenda: 0 Note Initiated On: 06/04/2019 9:38 AM Scope Withdrawal Time: 0 hours 14 minutes 29 seconds  Total Procedure Duration: 0 hours 17 minutes 31 seconds  Estimated Blood Loss:  Estimated blood loss: none.      [facility]

## 2019-06-04 NOTE — Transfer of Care (Signed)
Immediate Anesthesia Transfer of Care Note  Patient: Ashley Mercado  Procedure(s) Performed: COLONOSCOPY WITH PROPOFOL (N/A )  Patient Location: PACU  Anesthesia Type:General  Level of Consciousness: sedated  Airway & Oxygen Therapy: Patient Spontanous Breathing  Post-op Assessment: Report given to RN and Post -op Vital signs reviewed and stable  Post vital signs: Reviewed and stable  Last Vitals:  Vitals Value Taken Time  BP 102/79 06/04/19 1018  Temp 36.2 C 06/04/19 1018  Pulse 82 06/04/19 1020  Resp 18 06/04/19 1020  SpO2 100 % 06/04/19 1020  Vitals shown include unvalidated device data.  Last Pain:  Vitals:   06/04/19 1018  TempSrc: Temporal  PainSc: 0-No pain         Complications: No apparent anesthesia complications

## 2019-06-04 NOTE — Anesthesia Procedure Notes (Signed)
Date/Time: 06/04/2019 9:45 AM Performed by: Allean Found, CRNA Pre-anesthesia Checklist: Patient identified, Emergency Drugs available, Suction available, Patient being monitored and Timeout performed Oxygen Delivery Method: Nasal cannula Placement Confirmation: positive ETCO2

## 2019-06-04 NOTE — Anesthesia Preprocedure Evaluation (Addendum)
Anesthesia Evaluation  Patient identified by MRN, date of birth, ID band Patient awake    Reviewed: Allergy & Precautions, H&P , NPO status , Patient's Chart, lab work & pertinent test results  Airway Mallampati: II  TM Distance: >3 FB     Dental  (+) Teeth Intact   Pulmonary neg pulmonary ROS, neg shortness of breath, neg COPD, neg recent URI,           Cardiovascular hypertension, (-) angina(-) Past MI (-) dysrhythmias      Neuro/Psych PSYCHIATRIC DISORDERS Anxiety Depression negative neurological ROS     GI/Hepatic negative GI ROS, Neg liver ROS,   Endo/Other  negative endocrine ROS  Renal/GU negative Renal ROS  negative genitourinary   Musculoskeletal   Abdominal   Peds  Hematology negative hematology ROS (+)   Anesthesia Other Findings Obesity  Past Medical History: No date: Folliculitis 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 No date: Hydradenitis No date: Hypertension No date: Keloid scar No date: Menorrhagia  No past surgical history on file.     Reproductive/Obstetrics negative OB ROS                            Anesthesia Physical Anesthesia Plan  ASA: II  Anesthesia Plan: General   Post-op Pain Management:    Induction:   PONV Risk Score and Plan: Propofol infusion and TIVA  Airway Management Planned: Natural Airway and Nasal Cannula  Additional Equipment:   Intra-op Plan:   Post-operative Plan:   Informed Consent: I have reviewed the patients History and Physical, chart, labs and discussed the procedure including the risks, benefits and alternatives for the proposed anesthesia with the patient or authorized representative who has indicated his/her understanding and acceptance.     Dental Advisory Given  Plan Discussed with: Anesthesiologist  Anesthesia Plan Comments:         Anesthesia Quick Evaluation

## 2019-06-04 NOTE — Anesthesia Post-op Follow-up Note (Signed)
Anesthesia QCDR form completed.        

## 2019-06-04 NOTE — H&P (Signed)
Jonathon Bellows, MD [address], [place], [place], [place], [address] [id number], [place], [place], [place], [address] Phone: [phone]  Fax: [phone]  Primary Care Physician:  Tower, Wynelle Fanny, MD   Pre-Procedure History & Physical: HPI:  Ashley Mercado is a 48 y.o. female is here for an colonoscopy.   Past Medical History:  Diagnosis Date  . Folliculitis   . HSV infection   . Hydradenitis   . Hypertension   . Keloid scar   . Menorrhagia     History reviewed. No pertinent surgical history.  Prior to Admission medications   Medication Sig Start Date End Date Taking? Authorizing Provider  [place] 137-50 MCG/ACT SUSP  01/13/17  Yes [provider]  levocetirizine (XYZAL) 5 MG tablet Take 2.5 mg by mouth.  01/13/17  Yes [provider]  montelukast (SINGULAIR) 10 MG tablet  01/13/17  Yes [provider]  Multiple Vitamins-Minerals (MULTIVITAMIN GUMMIES ADULT PO) Take 2 each by mouth daily.   Yes [provider]  omeprazole (PRILOSEC) 20 MG capsule Take 1 capsule (20 mg total) by mouth daily. 04/17/19  Yes Tower, Wynelle Fanny, MD  spironolactone (ALDACTONE) 50 MG tablet TAKE 1 TABLET (50 MG TOTAL) BY MOUTH DAILY. 04/17/19  Yes Tower, Wynelle Fanny, MD  docusate sodium (COLACE) 100 MG capsule Take 1 capsule (100 mg total) by mouth 2 (two) times daily. 04/23/19   Landis Martins, DPM  EPIPEN 2-PAK 0.3 MG/0.3ML SOAJ injection  01/13/17   [provider]  ibuprofen (ADVIL) 800 MG tablet Take 1 tablet (800 mg total) by mouth every 8 (eight) hours as needed. 05/29/19   Landis Martins, DPM  promethazine (PHENERGAN) 25 MG tablet Take 1 tablet (25 mg total) by mouth every 8 (eight) hours as needed for nausea or vomiting. 04/23/19   Stover, Lady Saucier, DPM  SUPREP BOWEL PREP KIT 17.5-3.13-1.6 GM/177ML SOLN See admin instructions. 04/30/19   [provider]    Allergies as of 04/19/2019 - Review Complete 04/17/2019  Allergen Reaction Noted   . Dust mite extract Cough and Itching 01/19/2017  . Fish allergy Hives 04/17/2019  . Other  05/26/2012  . Shrimp extract allergy skin test Itching and Swelling 01/19/2017  . Sulfa antibiotics Rash 02/23/2013    Family History  Problem Relation Age of Onset  . Hypertension Father   . Diabetes Father   . Hypertension Mother   . Diabetes Mother   . Cancer Maternal Uncle        lymphoma  . Cancer Maternal Grandmother        ovarian    Social History   Socioeconomic History  . Marital status: Married    Spouse name: Not on file  . Number of children: 0  . Years of education: masters  . Highest education level: Not on file  Occupational History  . Occupation: Building services engineer, Technical sales engineer  Social Needs  . Financial resource strain: Not on file  . Food insecurity    Worry: Not on file    Inability: Not on file  . Transportation needs    Medical: Not on file    Non-medical: Not on file  Tobacco Use  . Smoking status: Never Smoker  . Smokeless tobacco: Never Used  Substance and Sexual Activity  . Alcohol use: Yes    Alcohol/week: 0.0 standard drinks    Comment: rare  . Drug use: No  . Sexual activity: Not on file  Lifestyle  . Physical activity  Days per week: Not on file    Minutes per session: Not on file  . Stress: Not on file  Relationships  . Social Herbalist on phone: Not on file    Gets together: Not on file    Attends religious service: Not on file    Active member of club or organization: Not on file    Attends meetings of clubs or organizations: Not on file    Relationship status: Not on file  . Intimate partner violence    Fear of current or ex partner: Not on file    Emotionally abused: Not on file    Physically abused: Not on file    Forced sexual activity: Not on file  Other Topics Concern  . Not on file  Social History Narrative   Private cell: [phone].  Designated party form signed on 01/26/10 appointing no one.  May leave  message on cell phone.  Lives with mom in a one story home.  No children.  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  Education: Masters    Review of Systems: See HPI, otherwise negative ROS  Physical Exam: BP 125/88   Pulse 91   Temp (!) 97.1 F (36.2 C) (Temporal)   Resp 17   Ht '5\' 7"'  (1.702 m)   Wt 113.4 kg   LMP  (Within Years)   SpO2 100%   BMI 39.16 kg/m  General:   Alert,  pleasant and cooperative in NAD Head:  Normocephalic and atraumatic. Neck:  Supple; no masses or thyromegaly. Lungs:  Clear throughout to auscultation, normal respiratory effort.    Heart:  +S1, +S2, Regular rate and rhythm, No edema. Abdomen:  Soft, nontender and nondistended. Normal bowel sounds, without guarding, and without rebound.   Neurologic:  Alert and  oriented x4;  grossly normal neurologically.  Impression/Plan: Ashley Mercado is here for an colonoscopy to be performed for family history of colon polyps. Risks, benefits, limitations, and alternatives regarding  colonoscopy have been reviewed with the patient.  Questions have been answered.  All parties agreeable.   Jonathon Bellows, MD  06/04/2019, 9:40 AM

## 2019-06-05 ENCOUNTER — Encounter: Payer: Self-pay | Admitting: Gastroenterology

## 2019-06-05 LAB — SURGICAL PATHOLOGY

## 2019-06-06 ENCOUNTER — Encounter: Payer: Self-pay | Admitting: Sports Medicine

## 2019-06-15 ENCOUNTER — Other Ambulatory Visit: Payer: Self-pay

## 2019-06-15 ENCOUNTER — Ambulatory Visit
Admission: RE | Admit: 2019-06-15 | Discharge: 2019-06-15 | Disposition: A | Discharge status: Home / Self Care | Payer: BC Managed Care – PPO | Source: Ambulatory Visit | Attending: Family Medicine | Admitting: Family Medicine

## 2019-06-15 DIAGNOSIS — Z1231 Encounter for screening mammogram for malignant neoplasm of breast: Secondary | ICD-10-CM

## 2019-06-19 ENCOUNTER — Encounter: Payer: Self-pay | Admitting: Sports Medicine

## 2019-06-19 ENCOUNTER — Ambulatory Visit (INDEPENDENT_AMBULATORY_CARE_PROVIDER_SITE_OTHER): Payer: BC Managed Care – PPO | Admitting: Sports Medicine

## 2019-06-19 ENCOUNTER — Other Ambulatory Visit: Payer: Self-pay

## 2019-06-19 DIAGNOSIS — M7661 Achilles tendinitis, right leg: Secondary | ICD-10-CM

## 2019-06-19 DIAGNOSIS — M79671 Pain in right foot: Secondary | ICD-10-CM

## 2019-06-19 DIAGNOSIS — M76821 Posterior tibial tendinitis, right leg: Secondary | ICD-10-CM

## 2019-06-19 DIAGNOSIS — Z9889 Other specified postprocedural states: Secondary | ICD-10-CM

## 2019-06-19 NOTE — Progress Notes (Signed)
Subjective: Ashley Mercado is a 48 y.o. female patient seen today in office for POV #5 (DOS 04/23/2019), S/P right posterior tibial tendon debridement with Kidner and tendo Achilles lengthening. Patient reports that she can walk for about 2 hours in before having a little pain or swelling when she is up on on it too much. Admits to a little blister to area that is doing better. No other issues noted.   Patient Active Problem List   Diagnosis Date Noted  . Colon cancer screening 04/17/2019  . Gastritis 09/12/2018  . Prediabetes 04/06/2017  . Carpal tunnel syndrome 11/10/2016  . Allergic rhinitis 11/10/2016  . Encounter for routine gynecological examination 03/10/2016  . Screening for HIV (human immunodeficiency virus) 03/07/2015  . Essential hypertension 10/28/2014  . PCOS (polycystic ovarian syndrome) 10/28/2014  . Stress reaction 03/01/2014  . Other screening mammogram 02/18/2012  . Morbid obesity ([place]) 10/20/2011  . Elevated blood-pressure reading without diagnosis of hypertension 10/20/2011  . Routine general medical examination at a health care facility 02/06/2011  . Depression 11/04/2010  . HIDRADENITIS SUPPURATIVA 05/12/2010  . 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 Elevated platelet count 11/30/2006  . KELOID SCAR 11/29/2006  . FOLLICULITIS 37/29/0211    Current Outpatient Medications on File Prior to Visit  Medication Sig Dispense Refill  . docusate sodium (COLACE) 100 MG capsule Take 1 capsule (100 mg total) by mouth 2 (two) times daily. 10 capsule 0  . DYMISTA 137-50 MCG/ACT SUSP     . EPIPEN 2-PAK 0.3 MG/0.3ML SOAJ injection     . ibuprofen (ADVIL) 800 MG tablet Take 1 tablet (800 mg total) by mouth every 8 (eight) hours as needed. 30 tablet 0  . levocetirizine (XYZAL) 5 MG tablet Take 2.5 mg by mouth.     . montelukast (SINGULAIR) 10 MG tablet     . Multiple Vitamins-Minerals (MULTIVITAMIN GUMMIES ADULT PO) Take 2 each by mouth daily.    [place] Kitchen omeprazole (PRILOSEC) 20 MG capsule Take 1  capsule (20 mg total) by mouth daily. 30 capsule 11  . promethazine (PHENERGAN) 25 MG tablet Take 1 tablet (25 mg total) by mouth every 8 (eight) hours as needed for nausea or vomiting. 20 tablet 0  . spironolactone (ALDACTONE) 50 MG tablet TAKE 1 TABLET (50 MG TOTAL) BY MOUTH DAILY. 30 tablet 11  . SUPREP BOWEL PREP KIT 17.5-3.13-1.6 GM/177ML SOLN See admin instructions.     No current facility-administered medications on file prior to visit.    Allergies  Allergen Reactions  . Dust Mite Extract Cough and Itching  . Fish Allergy Hives    *CATFISH*  . Other     artificial sweeteners  . Shrimp Extract Allergy Skin Test Itching and Swelling  . Sulfa Antibiotics Rash    Objective: There were no vitals filed for this visit.  General: No acute distress, AAOx3  Right foot: Incision healing welling very pinpoint scab at proximal incision, no erythema, no warmth, no drainage, no signs of infection noted, Capillary fill time <3 seconds in all digits, gross sensation present via light touch to right foot subjective numbness that is getting better.  No pain or crepitation with range of motion right foot. Strength 4/5 on right due to mild guarding.   Assessment and Plan:  Problem List Items Addressed This Visit    None    Visit Diagnoses    Posterior tibial tendinitis of right leg    -  Primary   S/P foot surgery, right       Tendonitis,  Achilles, right       Right foot pain         -Patient seen and evaluated -Advised patient to use antibiotic cream to incision as needed  -May d/c CAM boot and may use post op shoe as dispensed -Continue with rest, ice, and elevation  -Conitnue with no work expected return on 07-24-19 -Will plan for xrays and surgical site check and adding on PT if needed at next office visit. In the meantime, patient to call office if any issues or problems arise.   Landis Martins, DPM

## 2019-06-20 ENCOUNTER — Other Ambulatory Visit: Payer: Self-pay | Admitting: Sports Medicine

## 2019-06-21 NOTE — Telephone Encounter (Signed)
Dr. Stover please advice 

## 2019-07-10 ENCOUNTER — Encounter: Payer: Self-pay | Admitting: Sports Medicine

## 2019-07-10 ENCOUNTER — Other Ambulatory Visit: Payer: Self-pay

## 2019-07-10 ENCOUNTER — Ambulatory Visit (INDEPENDENT_AMBULATORY_CARE_PROVIDER_SITE_OTHER): Payer: 59

## 2019-07-10 ENCOUNTER — Ambulatory Visit (INDEPENDENT_AMBULATORY_CARE_PROVIDER_SITE_OTHER): Payer: 59 | Admitting: Sports Medicine

## 2019-07-10 DIAGNOSIS — M79671 Pain in right foot: Secondary | ICD-10-CM

## 2019-07-10 DIAGNOSIS — M76821 Posterior tibial tendinitis, right leg: Secondary | ICD-10-CM | POA: Diagnosis not present

## 2019-07-10 DIAGNOSIS — Z9889 Other specified postprocedural states: Secondary | ICD-10-CM

## 2019-07-10 NOTE — Progress Notes (Signed)
Subjective: Ashley Mercado is a 49 y.o. female patient seen today in office for POV # 6 (DOS 04/23/2019), S/P right posterior tibial tendon debridement with Kidner and tendo Achilles lengthening. Patient reports that she still has a little bit of burning and stinging pain last night the pain woke her up took ibuprofen which gave her relief.  Patient also admits to some tightness or swelling at the right foot and ankle but otherwise she is doing okay. No other issues noted.   Patient Active Problem List   Diagnosis Date Noted  . Colon cancer screening 04/17/2019  . Gastritis 09/12/2018  . Prediabetes 04/06/2017  . Carpal tunnel syndrome 11/10/2016  . Allergic rhinitis 11/10/2016  . Encounter for routine gynecological examination 03/10/2016  . Screening for HIV (human immunodeficiency virus) 03/07/2015  . Essential hypertension 10/28/2014  . PCOS (polycystic ovarian syndrome) 10/28/2014  . Stress reaction 03/01/2014  . Other screening mammogram 02/18/2012  . Morbid obesity ([place]) 10/20/2011  . Elevated blood-pressure reading without diagnosis of hypertension 10/20/2011  . Routine general medical examination at a health care facility 02/06/2011  . Depression 11/04/2010  . HIDRADENITIS SUPPURATIVA 05/12/2010  . 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 Elevated platelet count 11/30/2006  . KELOID SCAR 11/29/2006  . FOLLICULITIS [id number]    Current Outpatient Medications on File Prior to Visit  Medication Sig Dispense Refill  . docusate sodium (COLACE) 100 MG capsule Take 1 capsule (100 mg total) by mouth 2 (two) times daily. 10 capsule 0  . DYMISTA 137-50 MCG/ACT SUSP     . EPIPEN 2-PAK 0.3 MG/0.3ML SOAJ injection     . ibuprofen (ADVIL) 800 MG tablet TAKE 1 TABLET BY MOUTH EVERY 8 HOURS AS NEEDED 30 tablet 0  . levocetirizine (XYZAL) 5 MG tablet Take 2.5 mg by mouth.     . montelukast (SINGULAIR) 10 MG tablet     . Multiple Vitamins-Minerals (MULTIVITAMIN GUMMIES ADULT PO) Take 2 each by mouth daily.     [place] Kitchen omeprazole (PRILOSEC) 20 MG capsule Take 1 capsule (20 mg total) by mouth daily. 30 capsule 11  . promethazine (PHENERGAN) 25 MG tablet Take 1 tablet (25 mg total) by mouth every 8 (eight) hours as needed for nausea or vomiting. 20 tablet 0  . spironolactone (ALDACTONE) 50 MG tablet TAKE 1 TABLET (50 MG TOTAL) BY MOUTH DAILY. 30 tablet 11   No current facility-administered medications on file prior to visit.    Allergies  Allergen Reactions  . Dust Mite Extract Cough and Itching  . Fish Allergy Hives    *CATFISH*  . Other     artificial sweeteners  . Shrimp Extract Allergy Skin Test Itching and Swelling  . Sulfa Antibiotics Rash    Objective: There were no vitals filed for this visit.  General: No acute distress, AAOx3  Right foot: Incision well-healed, no erythema, no warmth, no drainage, no signs of infection noted, Capillary fill time <3 seconds in all digits, gross sensation present via light touch to right foot subjective numbness that is getting better.  No pain or crepitation with range of motion right foot. Strength 4/5 on right due to mild guarding like previous.   Assessment and Plan:  Problem List Items Addressed This Visit    None    Visit Diagnoses    Posterior tibial tendinitis of right leg    -  Primary   Relevant Orders   DG Foot Complete Right (Completed)   S/P foot surgery, right       Relevant Orders  DG Foot Complete Right (Completed)   Right foot pain         -Patient seen and evaluated -X-rays consistent with postoperative status anchor in good position at navicular no other acute findings -RX physical therapy and benchmark to assist with strengthening gait instability and edema control postop -Advised to continue with good supportive shoe -Continue with rest, ice, and elevation  -Conitnue with no work expected return on 07-24-19 as previous; work note provided at today's visit -Will plan for final postop check at next office visit.. In the  meantime, patient to call office if any issues or problems arise.   Landis Martins, DPM

## 2019-07-11 ENCOUNTER — Telehealth: Payer: Self-pay | Admitting: *Deleted

## 2019-07-11 NOTE — Telephone Encounter (Signed)
Faxed required form, demographics to [facility].

## 2019-07-11 NOTE — Telephone Encounter (Signed)
-----   Message from Landis Martins, [place] sent at 07/10/2019  2:17 PM EST ----- Regarding: PT in [facility] in [place] post surgical

## 2019-07-17 ENCOUNTER — Encounter: Payer: Self-pay | Admitting: Sports Medicine

## 2019-07-17 NOTE — Telephone Encounter (Signed)
Pt is a [facility] can you get them another work note please. Thanks

## 2019-08-22 ENCOUNTER — Other Ambulatory Visit: Payer: Self-pay

## 2019-08-22 ENCOUNTER — Encounter: Payer: Self-pay | Admitting: Sports Medicine

## 2019-08-22 ENCOUNTER — Ambulatory Visit (INDEPENDENT_AMBULATORY_CARE_PROVIDER_SITE_OTHER): Payer: 59 | Admitting: Sports Medicine

## 2019-08-22 DIAGNOSIS — M76821 Posterior tibial tendinitis, right leg: Secondary | ICD-10-CM | POA: Diagnosis not present

## 2019-08-22 DIAGNOSIS — M7661 Achilles tendinitis, right leg: Secondary | ICD-10-CM | POA: Diagnosis not present

## 2019-08-22 DIAGNOSIS — T148XXA Other injury of unspecified body region, initial encounter: Secondary | ICD-10-CM

## 2019-08-22 DIAGNOSIS — Z9889 Other specified postprocedural states: Secondary | ICD-10-CM | POA: Diagnosis not present

## 2019-08-22 DIAGNOSIS — M722 Plantar fascial fibromatosis: Secondary | ICD-10-CM

## 2019-08-22 DIAGNOSIS — M79671 Pain in right foot: Secondary | ICD-10-CM | POA: Diagnosis not present

## 2019-08-22 NOTE — Progress Notes (Signed)
Subjective: Ashley Mercado is a 49 y.o. female patient seen today in office for POV # 7 (DOS 04/23/2019), S/P right posterior tibial tendon debridement with Kidner and tendo Achilles lengthening. Patient reports that she has some pain in the back and bottom of her heel with pulling and has difficulty at work navigating steps and had a bad day of pain, throbbing, tightness and swelling at the right foot and ankle. Reports that she has been using compression sleeve but still has issues with swelling. No other issues noted.   Patient Active Problem List   Diagnosis Date Noted  . Colon cancer screening 04/17/2019  . Gastritis 09/12/2018  . Prediabetes 04/06/2017  . Carpal tunnel syndrome 11/10/2016  . Allergic rhinitis 11/10/2016  . Encounter for routine gynecological examination 03/10/2016  . Screening for HIV (human immunodeficiency virus) 03/07/2015  . Essential hypertension 10/28/2014  . PCOS (polycystic ovarian syndrome) 10/28/2014  . Stress reaction 03/01/2014  . Other screening mammogram 02/18/2012  . Morbid obesity ([place]) 10/20/2011  . Elevated blood-pressure reading without diagnosis of hypertension 10/20/2011  . Routine general medical examination at a health care facility 02/06/2011  . Depression 11/04/2010  . HIDRADENITIS SUPPURATIVA 05/12/2010  . 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 Elevated platelet count 11/30/2006  . KELOID SCAR 11/29/2006  . FOLLICULITIS Q000111Q    Current Outpatient Medications on File Prior to Visit  Medication Sig Dispense Refill  . docusate sodium (COLACE) 100 MG capsule Take 1 capsule (100 mg total) by mouth 2 (two) times daily. 10 capsule 0  . DYMISTA 137-50 MCG/ACT SUSP     . EPIPEN 2-PAK 0.3 MG/0.3ML SOAJ injection     . ibuprofen (ADVIL) 800 MG tablet TAKE 1 TABLET BY MOUTH EVERY 8 HOURS AS NEEDED 30 tablet 0  . levocetirizine (XYZAL) 5 MG tablet Take 2.5 mg by mouth.     . montelukast (SINGULAIR) 10 MG tablet     . Multiple Vitamins-Minerals (MULTIVITAMIN  GUMMIES ADULT PO) Take 2 each by mouth daily.    Mar[place] Kitchen omeprazole (PRILOSEC) 20 MG capsule Take 1 capsule (20 mg total) by mouth daily. 30 capsule 11  . promethazine (PHENERGAN) 25 MG tablet Take 1 tablet (25 mg total) by mouth every 8 (eight) hours as needed for nausea or vomiting. 20 tablet 0  . spironolactone (ALDACTONE) 50 MG tablet TAKE 1 TABLET (50 MG TOTAL) BY MOUTH DAILY. 30 tablet 11   No current facility-administered medications on file prior to visit.    Allergies  Allergen Reactions  . Dust Mite Extract Cough and Itching  . Fish Allergy Hives    *CATFISH*  . Other     artificial sweeteners  . Shrimp Extract Allergy Skin Test Itching and Swelling  . Sulfa Antibiotics Rash    Objective: There were no vitals filed for this visit.  General: No acute distress, AAOx3  Right foot: Incision well-healed, no erythema, no warmth, no drainage, no signs of infection noted, Capillary fill time <3 seconds in all digits, gross sensation present via light touch to right foot.Pain at achilles insertions and subjective pulling at plantar fascia insertion with chronic edema. Strength 4/5 on right due to mild guarding like previous.   Assessment and Plan:  Problem List Items Addressed This Visit    None    Visit Diagnoses    Posterior tibial tendinitis of right leg    -  Primary   S/P foot surgery, right       Right foot pain       Tendonitis,  Achilles, right       Plantar fasciitis       Tendon tear         -Patient seen and evaluated -Rx Ultrasound to r/o tear at achilles or plantar fascia or any disturbance of surgical site at PT tendon course -Continue with limited physical therapy and benchmark to assist with strengthening gait instability and edema control postop -Continue with compression stockings -Continue with trilock -Advised to continue with good supportive shoe and orthotics  -Continue with rest, ice, and elevation  -Work note provided  -Will plan for discussion of  ultrasound results at next office visit.. In the meantime, patient to call office if any issues or problems arise.   Landis Martins, DPM

## 2019-08-24 ENCOUNTER — Telehealth: Payer: Self-pay | Admitting: *Deleted

## 2019-08-24 NOTE — Addendum Note (Signed)
Addended by: [place] Mon R on: 08/24/2019 02:05 PM   Modules accepted: Orders

## 2019-08-24 NOTE — Telephone Encounter (Signed)
error 

## 2019-08-24 NOTE — Telephone Encounter (Signed)
Called and left a message for the patient stating that I got the ultrasound order in for [facility] and gave the phone number and the address. Lattie Haw

## 2020-01-08 ENCOUNTER — Telehealth: Payer: Self-pay | Admitting: Family Medicine

## 2020-01-08 DIAGNOSIS — R7989 Other specified abnormal findings of blood chemistry: Secondary | ICD-10-CM

## 2020-01-08 DIAGNOSIS — E282 Polycystic ovarian syndrome: Secondary | ICD-10-CM

## 2020-01-08 DIAGNOSIS — Z0000 Encounter for general adult medical examination without abnormal findings: Secondary | ICD-10-CM

## 2020-01-08 DIAGNOSIS — R7303 Prediabetes: Secondary | ICD-10-CM

## 2020-01-08 DIAGNOSIS — I10 Essential (primary) hypertension: Secondary | ICD-10-CM

## 2020-01-08 NOTE — Telephone Encounter (Signed)
-----   Message from Cloyd Stagers, RT sent at 12/28/2019  4:31 PM EDT ----- Regarding: Lab Orders for Wednesday 7.7.2021 Please place lab orders for Wednesday 7.7.2021, office visit for physical on Wednesday 7.14.2021 Thank you, Dyke Maes RT(R)

## 2020-01-09 ENCOUNTER — Other Ambulatory Visit: Payer: Self-pay

## 2020-01-09 ENCOUNTER — Other Ambulatory Visit (INDEPENDENT_AMBULATORY_CARE_PROVIDER_SITE_OTHER): Payer: 59

## 2020-01-09 DIAGNOSIS — R7303 Prediabetes: Secondary | ICD-10-CM

## 2020-01-09 DIAGNOSIS — R7989 Other specified abnormal findings of blood chemistry: Secondary | ICD-10-CM

## 2020-01-09 DIAGNOSIS — I10 Essential (primary) hypertension: Secondary | ICD-10-CM | POA: Diagnosis not present

## 2020-01-09 LAB — LIPID PANEL
Cholesterol: 196 mg/dL (ref 0–200)
HDL: 53.4 mg/dL (ref >39.00)
LDL Cholesterol: 127 mg/dL — ABNORMAL HIGH (ref 0–99)
NonHDL: 142.52
Total CHOL/HDL Ratio: 4
Triglycerides: 77 mg/dL (ref 0.0–149.0)
VLDL: 15.4 mg/dL (ref 0.0–40.0)

## 2020-01-09 LAB — CBC WITH DIFFERENTIAL/PLATELET
Basophils Absolute: 0 10*3/uL (ref 0.0–0.1)
Basophils Relative: 1 % (ref 0.0–3.0)
Eosinophils Absolute: 0 10*3/uL (ref 0.0–0.7)
Eosinophils Relative: 1 % (ref 0.0–5.0)
HCT: 42.3 % (ref 36.0–46.0)
Hemoglobin: 14.2 g/dL (ref 12.0–15.0)
Lymphocytes Relative: 31.6 % (ref 12.0–46.0)
Lymphs Abs: 1.4 10*3/uL (ref 0.7–4.0)
MCHC: 33.6 g/dL (ref 30.0–36.0)
MCV: 88.6 fl (ref 78.0–100.0)
Monocytes Absolute: 0.4 10*3/uL (ref 0.1–1.0)
Monocytes Relative: 8.9 % (ref 3.0–12.0)
Neutro Abs: 2.6 10*3/uL (ref 1.4–7.7)
Neutrophils Relative %: 57.5 % (ref 43.0–77.0)
Platelets: 403 10*3/uL — ABNORMAL HIGH (ref 150.0–400.0)
RBC: 4.78 Mil/uL (ref 3.87–5.11)
RDW: 13.9 % (ref 11.5–15.5)
WBC: 4.6 10*3/uL (ref 4.0–10.5)

## 2020-01-09 LAB — COMPREHENSIVE METABOLIC PANEL
ALT: 12 U/L (ref 0–35)
AST: 13 U/L (ref 0–37)
Albumin: 4.1 g/dL (ref 3.5–5.2)
Alkaline Phosphatase: 80 U/L (ref 39–117)
BUN: 15 mg/dL (ref 6–23)
CO2: 30 mEq/L (ref 19–32)
Calcium: 9.1 mg/dL (ref 8.4–10.5)
Chloride: 101 mEq/L (ref 96–112)
Creatinine, Ser: 0.83 mg/dL (ref 0.40–1.20)
GFR: 87.63 mL/min (ref >60.00)
Glucose, Bld: 94 mg/dL (ref 70–99)
Potassium: 4.4 mEq/L (ref 3.5–5.1)
Sodium: 139 mEq/L (ref 135–145)
Total Bilirubin: 0.5 mg/dL (ref 0.2–1.2)
Total Protein: 7.3 g/dL (ref 6.0–8.3)

## 2020-01-09 LAB — TSH: TSH: 1.77 u[IU]/mL (ref 0.35–4.50)

## 2020-01-09 LAB — HEMOGLOBIN A1C: Hgb A1c MFr Bld: 5.6 % (ref 4.6–6.5)

## 2020-01-15 ENCOUNTER — Other Ambulatory Visit: Payer: Self-pay

## 2020-01-16 ENCOUNTER — Ambulatory Visit (INDEPENDENT_AMBULATORY_CARE_PROVIDER_SITE_OTHER): Payer: 59 | Admitting: Family Medicine

## 2020-01-16 ENCOUNTER — Encounter: Payer: Self-pay | Admitting: Family Medicine

## 2020-01-16 VITALS — BP 116/76 | HR 64 | Temp 96.9°F | Ht 67.0 in | Wt 247.1 lb

## 2020-01-16 DIAGNOSIS — R7989 Other specified abnormal findings of blood chemistry: Secondary | ICD-10-CM

## 2020-01-16 DIAGNOSIS — I10 Essential (primary) hypertension: Secondary | ICD-10-CM

## 2020-01-16 DIAGNOSIS — R7303 Prediabetes: Secondary | ICD-10-CM | POA: Diagnosis not present

## 2020-01-16 DIAGNOSIS — Z0000 Encounter for general adult medical examination without abnormal findings: Secondary | ICD-10-CM | POA: Diagnosis not present

## 2020-01-16 DIAGNOSIS — E282 Polycystic ovarian syndrome: Secondary | ICD-10-CM | POA: Diagnosis not present

## 2020-01-16 DIAGNOSIS — E669 Obesity, unspecified: Secondary | ICD-10-CM

## 2020-01-16 DIAGNOSIS — L732 Hidradenitis suppurativa: Secondary | ICD-10-CM

## 2020-01-16 NOTE — Assessment & Plan Note (Signed)
This may be cause of recurrent boil on buttocks Disc symptomatic care/need to keep clean and dry  Consider ref to derm if needed

## 2020-01-16 NOTE — Assessment & Plan Note (Signed)
Improved today  Perhaps due to foot /tendon repair (? Less inflammation) No symptoms Will continue to monitor

## 2020-01-16 NOTE — Assessment & Plan Note (Signed)
Lab Results  Component Value Date   HGBA1C 5.6 01/09/2020   Improved with wt loss disc imp of low glycemic diet and wt loss to prevent DM2

## 2020-01-16 NOTE — Assessment & Plan Note (Signed)
Discussed how this problem influences overall health and the risks it imposes  Reviewed plan for weight loss with lower calorie diet (via better food choices and also portion control or program like weight watchers) and exercise building up to or more than 30 minutes 5 days per week including some aerobic activity   Commended on wt loss so far  

## 2020-01-16 NOTE — Assessment & Plan Note (Signed)
Pt continues spironolactone

## 2020-01-16 NOTE — Progress Notes (Signed)
Subjective:    Patient ID: Ashley Mercado, female    DOB: 10-May-1971, 49 y.o.   MRN: [id number]  This visit occurred during the SARS-CoV-2 public health emergency.  Safety protocols were in place, including screening questions prior to the visit, additional usage of staff PPE, and extensive cleaning of exam room while observing appropriate contact time as indicated for disinfecting solutions.    HPI  Here for health maintenance exam and to review chronic medical problems    Wt Readings from Last 3 Encounters:  01/16/20 247 lb 1 oz (112.1 kg)  06/04/19 250 lb (113.4 kg)  04/17/19 258 lb 6 oz (117.2 kg)  lost weight (even more because wt went up more)  Eating healthy and some intermittent fasting - does 16 hours - daily  Eats 2-3 times per day in 8 hour window  38.70 kg/m   Exercise - has to modify with her foot   Had her foot/tendon surgery  Some improvement   covid immunized Zoster status - was interested in shingrix /has to check on coverage   Flu shot 10/20 Tdap 10/18  Mammogram 12/20  Self breast exam -no lumps or changes   Pap 10/20 -normal  Post menopausal  No gyn problems   Colonoscopy 11/20 with a 3 y recall  No change in bowel habits   HTN bp is stable today  No cp or palpitations or headaches or edema  No side effects to medicines  BP Readings from Last 3 Encounters:  01/16/20 116/76  06/04/19 122/87  04/17/19 122/76     Pulse Readings from Last 3 Encounters:  01/16/20 64  06/04/19 91  04/17/19 87    Prediabetes Lab Results  Component Value Date   HGBA1C 5.6 01/09/2020   Down from 6.1 Diet is helping   Cholesterol Lab Results  Component Value Date   CHOL 196 01/09/2020   CHOL 188 04/13/2019   CHOL 157 04/07/2018   Lab Results  Component Value Date   HDL 53.40 01/09/2020   HDL 41.10 04/13/2019   HDL 45.10 04/07/2018   Lab Results  Component Value Date   LDLCALC 127 (H) 01/09/2020   LDLCALC 131 (H) 04/13/2019   LDLCALC 101 (H)  04/07/2018   Lab Results  Component Value Date   TRIG 77.0 01/09/2020   TRIG 76.0 04/13/2019   TRIG 57.0 04/07/2018   Lab Results  Component Value Date   CHOLHDL 4 01/09/2020   CHOLHDL 5 04/13/2019   CHOLHDL 3 04/07/2018   No results found for: LDLDIRECT HDL up and ratio is down   Elevated platelets baseline Lab Results  Component Value Date   WBC 4.6 01/09/2020   HGB 14.2 01/09/2020   HCT 42.3 01/09/2020   MCV 88.6 01/09/2020   PLT 403.0 (H) 01/09/2020   Down from 446  Found to be reactive on path smear in the past   Lab Results  Component Value Date   CREATININE 0.83 01/09/2020   BUN 15 01/09/2020   NA 139 01/09/2020   K 4.4 01/09/2020   CL 101 01/09/2020   CO2 30 01/09/2020   Lab Results  Component Value Date   ALT 12 01/09/2020   AST 13 01/09/2020   ALKPHOS 80 01/09/2020   BILITOT 0.5 01/09/2020    Lab Results  Component Value Date   TSH 1.77 01/09/2020    Has a re occ  Boil on buttocks/R  Had to be opened/packed once  Not a problem now   No fam  hx of OP  No hx of fragility fracture   Still has hot flashes at night  Uses a cooling blanket at night  Able to sleep   Patient Active Problem List   Diagnosis Date Noted  . Colon cancer screening 04/17/2019  . Prediabetes 04/06/2017  . Carpal tunnel syndrome 11/10/2016  . Allergic rhinitis 11/10/2016  . Encounter for routine gynecological examination 03/10/2016  . Screening for HIV (human immunodeficiency virus) 03/07/2015  . Essential hypertension 10/28/2014  . PCOS (polycystic ovarian syndrome) 10/28/2014  . Stress reaction 03/01/2014  . Other screening mammogram 02/18/2012  . Obesity (BMI 30-39.9) 10/20/2011  . Elevated blood-pressure reading without diagnosis of hypertension 10/20/2011  . Routine general medical examination at a health care facility 02/06/2011  . Depression 11/04/2010  . HIDRADENITIS SUPPURATIVA 05/12/2010  . 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 Elevated platelet count 11/30/2006  . KELOID  SCAR 11/29/2006  . FOLLICULITIS 62/95/2841   Past Medical History:  Diagnosis Date  . Folliculitis   . HSV infection   . Hydradenitis   . Hypertension   . Keloid scar   . Menorrhagia    Past Surgical History:  Procedure Laterality Date  . COLONOSCOPY WITH PROPOFOL N/A 06/04/2019   Procedure: COLONOSCOPY WITH PROPOFOL;  Surgeon: Jonathon Bellows, MD;  Location: [facility] ENDOSCOPY;  Service: Gastroenterology;  Laterality: N/A;   Social History   Tobacco Use  . Smoking status: Never Smoker  . Smokeless tobacco: Never Used  Vaping Use  . Vaping Use: Never used  Substance Use Topics  . Alcohol use: Yes    Alcohol/week: 0.0 standard drinks    Comment: rare  . Drug use: No   Family History  Problem Relation Age of Onset  . Hypertension Father   . Diabetes Father   . Hypertension Mother   . Diabetes Mother   . Cancer Maternal Uncle        lymphoma  . Cancer Maternal Grandmother        ovarian   Allergies  Allergen Reactions  . Dust Mite Extract Cough and Itching  . Fish Allergy Hives    *CATFISH*  . Other     artificial sweeteners  . Shrimp Extract Allergy Skin Test Itching and Swelling  . Sulfa Antibiotics Rash   Current Outpatient Medications on File Prior to Visit  Medication Sig Dispense Refill  . docusate sodium (COLACE) 100 MG capsule Take 1 capsule (100 mg total) by mouth 2 (two) times daily. 10 capsule 0  . DYMISTA 137-50 MCG/ACT SUSP     . EPIPEN 2-PAK 0.3 MG/0.3ML SOAJ injection     . ibuprofen (ADVIL) 800 MG tablet TAKE 1 TABLET BY MOUTH EVERY 8 HOURS AS NEEDED 30 tablet 0  . levocetirizine (XYZAL) 5 MG tablet Take 2.5 mg by mouth.     . montelukast (SINGULAIR) 10 MG tablet     . Multiple Vitamins-Minerals (MULTIVITAMIN GUMMIES ADULT PO) Take 2 each by mouth daily.    . promethazine (PHENERGAN) 25 MG tablet Take 1 tablet (25 mg total) by mouth every 8 (eight) hours as needed for nausea or vomiting. 20 tablet 0  . spironolactone (ALDACTONE) 50 MG tablet TAKE 1  TABLET (50 MG TOTAL) BY MOUTH DAILY. 30 tablet 11  . omeprazole (PRILOSEC) 20 MG capsule Take 1 capsule (20 mg total) by mouth daily. (Patient not taking: Reported on 01/16/2020) 30 capsule 11   No current facility-administered medications on file prior to visit.     Review of Systems  Constitutional: Negative for  activity change, appetite change, fatigue, fever and unexpected weight change.  HENT: Negative for congestion, ear pain, rhinorrhea, sinus pressure and sore throat.   Eyes: Negative for pain, redness and visual disturbance.  Respiratory: Negative for cough, shortness of breath and wheezing.   Cardiovascular: Negative for chest pain and palpitations.  Gastrointestinal: Negative for abdominal pain, blood in stool, constipation and diarrhea.  Endocrine: Negative for polydipsia and polyuria.  Genitourinary: Negative for dysuria, frequency and urgency.       Hot flashes  Musculoskeletal: Negative for arthralgias, back pain and myalgias.  Skin: Negative for pallor and rash.       occ boils- buttock/groin  Allergic/Immunologic: Negative for environmental allergies.  Neurological: Negative for dizziness, syncope and headaches.  Hematological: Negative for adenopathy. Does not bruise/bleed easily.  Psychiatric/Behavioral: Negative for decreased concentration and dysphoric mood. The patient is not nervous/anxious.        Objective:   Physical Exam Constitutional:      General: She is not in acute distress.    Appearance: Normal appearance. She is well-developed. She is obese. She is not ill-appearing or diaphoretic.  HENT:     Head: Normocephalic and atraumatic.     Right Ear: Tympanic membrane, ear canal and external ear normal.     Left Ear: Tympanic membrane, ear canal and external ear normal.     Nose: Nose normal. No congestion.     Mouth/Throat:     Mouth: Mucous membranes are moist.     Pharynx: Oropharynx is clear. No posterior oropharyngeal erythema.  Eyes:      General: No scleral icterus.    Extraocular Movements: Extraocular movements intact.     Conjunctiva/sclera: Conjunctivae normal.     Pupils: Pupils are equal, round, and reactive to light.  Neck:     Thyroid: No thyromegaly.     Vascular: No carotid bruit or JVD.  Cardiovascular:     Rate and Rhythm: Normal rate and regular rhythm.     Pulses: Normal pulses.     Heart sounds: Normal heart sounds. No gallop.   Pulmonary:     Effort: Pulmonary effort is normal. No respiratory distress.     Breath sounds: Normal breath sounds. No wheezing.     Comments: Good air exch Chest:     Chest wall: No tenderness.  Abdominal:     General: Bowel sounds are normal. There is no distension or abdominal bruit.     Palpations: Abdomen is soft. There is no mass.     Tenderness: There is no abdominal tenderness.     Hernia: No hernia is present.  Genitourinary:    Comments: Breast exam: No mass, nodules, thickening, tenderness, bulging, retraction, inflamation, nipple discharge or skin changes noted.  No axillary or clavicular LA.     Musculoskeletal:        General: No tenderness. Normal range of motion.     Cervical back: Normal range of motion and neck supple. No rigidity. No muscular tenderness.     Right lower leg: No edema.     Left lower leg: No edema.     Comments: R ankle in a splint  Lymphadenopathy:     Cervical: No cervical adenopathy.  Skin:    General: Skin is warm and dry.     Coloration: Skin is not pale.     Findings: No erythema or rash.  Neurological:     Mental Status: She is alert. Mental status is at baseline.     Cranial  Nerves: No cranial nerve deficit.     Motor: No abnormal muscle tone.     Coordination: Coordination normal.     Gait: Gait normal.     Deep Tendon Reflexes: Reflexes are normal and symmetric. Reflexes normal.  Psychiatric:        Mood and Affect: Mood normal.        Cognition and Memory: Cognition and memory normal.           Assessment &  Plan:   Problem List Items Addressed This Visit      Cardiovascular and Mediastinum   Essential hypertension    bp in fair control at this time  BP Readings from Last 1 Encounters:  01/16/20 116/76   No changes needed Most recent labs reviewed  Disc lifstyle change with low sodium diet and exercise          Endocrine   PCOS (polycystic ovarian syndrome)    Pt continues spironolactone        Musculoskeletal and Integument   HIDRADENITIS SUPPURATIVA    This may be cause of recurrent boil on buttocks Disc symptomatic care/need to keep clean and dry  Consider ref to derm if needed        Other   Elevated platelet count    Improved today  Perhaps due to foot /tendon repair (? Less inflammation) No symptoms Will continue to monitor       Routine general medical examination at a health care facility - Primary    Reviewed health habits including diet and exercise and skin cancer prevention Reviewed appropriate screening tests for age  Also reviewed health mt list, fam hx and immunization status , as well as social and family history   See HPI Labs reviewed Wt loss-commended with diet/exercise  covid immunized She will look into coverage of shingrix  Rev screening recommendations-she is utd Recommend ca and D for post menopausal OP prev as well as exercise       Obesity (BMI 30-39.9)    Discussed how this problem influences overall health and the risks it imposes  Reviewed plan for weight loss with lower calorie diet (via better food choices and also portion control or program like weight watchers) and exercise building up to or more than 30 minutes 5 days per week including some aerobic activity   Commended on wt loss so far      Prediabetes    Lab Results  Component Value Date   HGBA1C 5.6 01/09/2020   Improved with wt loss disc imp of low glycemic diet and wt loss to prevent DM2

## 2020-01-16 NOTE — Assessment & Plan Note (Addendum)
Reviewed health habits including diet and exercise and skin cancer prevention Reviewed appropriate screening tests for age  Also reviewed health mt list, fam hx and immunization status , as well as social and family history   See HPI Labs reviewed Wt loss-commended with diet/exercise  covid immunized She will look into coverage of shingrix  Rev screening recommendations-she is utd Recommend ca and D for post menopausal OP prev as well as exercise

## 2020-01-16 NOTE — Patient Instructions (Addendum)
If you are interested in the shingles vaccine series (Shingrix), call your insurance or pharmacy to check on coverage and location it must be given.  If affordable - you can schedule it here or at your pharmacy depending on coverage   Keep working on healthy diet and exercise   Keep the buttock area (with recurrent boil) clean with soap and water-use warm compress if you feel it coming on  Antibiotic ointment may help also  Eventually a dermatologist may be able to remove it   For bone health add another 2000 iu vitamin D daily  Calcium if tolerated - try to get 1200 mg daily divided in two  Exercise helps also

## 2020-01-16 NOTE — Assessment & Plan Note (Signed)
bp in fair control at this time  BP Readings from Last 1 Encounters:  01/16/20 116/76   No changes needed Most recent labs reviewed  Disc lifstyle change with low sodium diet and exercise

## 2020-06-12 ENCOUNTER — Other Ambulatory Visit: Payer: Self-pay | Admitting: Family Medicine

## 2020-06-12 DIAGNOSIS — Z1231 Encounter for screening mammogram for malignant neoplasm of breast: Secondary | ICD-10-CM

## 2020-06-24 ENCOUNTER — Ambulatory Visit (INDEPENDENT_AMBULATORY_CARE_PROVIDER_SITE_OTHER): Payer: 59 | Admitting: Family Medicine

## 2020-06-24 ENCOUNTER — Other Ambulatory Visit: Payer: Self-pay

## 2020-06-24 ENCOUNTER — Encounter: Payer: Self-pay | Admitting: Family Medicine

## 2020-06-24 DIAGNOSIS — M25552 Pain in left hip: Secondary | ICD-10-CM | POA: Diagnosis not present

## 2020-06-24 DIAGNOSIS — N950 Postmenopausal bleeding: Secondary | ICD-10-CM | POA: Insufficient documentation

## 2020-06-24 LAB — CBC WITH DIFFERENTIAL/PLATELET
Basophils Absolute: 0 10*3/uL (ref 0.0–0.1)
Basophils Relative: 0.5 % (ref 0.0–3.0)
Eosinophils Absolute: 0 10*3/uL (ref 0.0–0.7)
Eosinophils Relative: 0.4 % (ref 0.0–5.0)
HCT: 39.3 % (ref 36.0–46.0)
Hemoglobin: 13 g/dL (ref 12.0–15.0)
Lymphocytes Relative: 29.8 % (ref 12.0–46.0)
Lymphs Abs: 1.6 10*3/uL (ref 0.7–4.0)
MCHC: 33.1 g/dL (ref 30.0–36.0)
MCV: 88.1 fl (ref 78.0–100.0)
Monocytes Absolute: 0.4 10*3/uL (ref 0.1–1.0)
Monocytes Relative: 7.8 % (ref 3.0–12.0)
Neutro Abs: 3.4 10*3/uL (ref 1.4–7.7)
Neutrophils Relative %: 61.5 % (ref 43.0–77.0)
Platelets: 437 10*3/uL — ABNORMAL HIGH (ref 150.0–400.0)
RBC: 4.46 Mil/uL (ref 3.87–5.11)
RDW: 13.5 % (ref 11.5–15.5)
WBC: 5.5 10*3/uL (ref 4.0–10.5)

## 2020-06-24 LAB — HCG, SERUM, QUALITATIVE: Preg, Serum: NEGATIVE

## 2020-06-24 LAB — LUTEINIZING HORMONE: LH: 44.95 m[IU]/mL

## 2020-06-24 LAB — FOLLICLE STIMULATING HORMONE: FSH: 72.5 m[IU]/mL

## 2020-06-24 NOTE — Progress Notes (Signed)
Subjective:    Patient ID: Ashley Mercado, female    DOB: 03/06/1971, 49 y.o.   MRN: [id number]  This visit occurred during the SARS-CoV-2 public health emergency.  Safety protocols were in place, including screening questions prior to the visit, additional usage of staff PPE, and extensive cleaning of exam room while observing appropriate contact time as indicated for disinfecting solutions.    HPI Pt presents with vaginal spotting   Wt Readings from Last 3 Encounters:  06/24/20 258 lb 9 oz (117.3 kg)  01/16/20 247 lb 1 oz (112.1 kg)  06/04/19 250 lb (113.4 kg)   40.50 kg/m   Several days ago she noted faint spots of red on toilet tissue  Nothing in underwear  A little pain in L lower abd/pelvic region   No blood thinners or ibuprofen   Last period was in 73s (early menopause)  Not a lot of hot flashes   No susp of STD  No new partners   No vag d/c or itching or burning   Lab Results  Component Value Date   WBC 4.6 01/09/2020   HGB 14.2 01/09/2020   HCT 42.3 01/09/2020   MCV 88.6 01/09/2020   PLT 403.0 (H) 01/09/2020   H/o PCOS Last gyn exam 10/20 with neg pap test   Patient Active Problem List   Diagnosis Date Noted  . Post-menopausal bleeding 06/24/2020  . Pelvic joint pain, left 06/24/2020  . Colon cancer screening 04/17/2019  . Prediabetes 04/06/2017  . Carpal tunnel syndrome 11/10/2016  . Allergic rhinitis 11/10/2016  . Encounter for routine gynecological examination 03/10/2016  . Screening for HIV (human immunodeficiency virus) 03/07/2015  . Essential hypertension 10/28/2014  . PCOS (polycystic ovarian syndrome) 10/28/2014  . Stress reaction 03/01/2014  . Other screening mammogram 02/18/2012  . Obesity (BMI 30-39.9) 10/20/2011  . Elevated blood-pressure reading without diagnosis of hypertension 10/20/2011  . Routine general medical examination at a health care facility 02/06/2011  . Depression 11/04/2010  . HIDRADENITIS SUPPURATIVA 05/12/2010  .  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 Elevated platelet count 11/30/2006  . KELOID SCAR 11/29/2006  . FOLLICULITIS 48/54/6270   Past Medical History:  Diagnosis Date  . Folliculitis   . HSV infection   . Hydradenitis   . Hypertension   . Keloid scar   . Menorrhagia    Past Surgical History:  Procedure Laterality Date  . COLONOSCOPY WITH PROPOFOL N/A 06/04/2019   Procedure: COLONOSCOPY WITH PROPOFOL;  Surgeon: Jonathon Bellows, MD;  Location: [facility] ENDOSCOPY;  Service: Gastroenterology;  Laterality: N/A;   Social History   Tobacco Use  . Smoking status: Never Smoker  . Smokeless tobacco: Never Used  Vaping Use  . Vaping Use: Never used  Substance Use Topics  . Alcohol use: Yes    Alcohol/week: 0.0 standard drinks    Comment: rare  . Drug use: No   Family History  Problem Relation Age of Onset  . Hypertension Father   . Diabetes Father   . Hypertension Mother   . Diabetes Mother   . Cancer Maternal Uncle        lymphoma  . Cancer Maternal Grandmother        ovarian   Allergies  Allergen Reactions  . Dust Mite Extract Cough and Itching  . Fish Allergy Hives    *CATFISH*  . Other     artificial sweeteners  . Shrimp Extract Allergy Skin Test Itching and Swelling  . Sulfa Antibiotics Rash   Current Outpatient Medications on File  Prior to Visit  Medication Sig Dispense Refill  . DYMISTA 137-50 MCG/ACT SUSP     . EPIPEN 2-PAK 0.3 MG/0.3ML SOAJ injection     . levocetirizine (XYZAL) 5 MG tablet Take 2.5 mg by mouth.     . montelukast (SINGULAIR) 10 MG tablet     . Multiple Vitamins-Minerals (MULTIVITAMIN GUMMIES ADULT PO) Take 2 each by mouth daily.    Marland Kitchen omeprazole (PRILOSEC) 20 MG capsule Take 1 capsule (20 mg total) by mouth daily. 30 capsule 11  . spironolactone (ALDACTONE) 50 MG tablet TAKE 1 TABLET (50 MG TOTAL) BY MOUTH DAILY. 30 tablet 11   No current facility-administered medications on file prior to visit.    Review of Systems  Constitutional: Negative for activity change,  appetite change, fatigue, fever and unexpected weight change.  HENT: Negative for congestion, ear pain, rhinorrhea, sinus pressure and sore throat.   Eyes: Negative for pain, redness and visual disturbance.  Respiratory: Negative for cough, shortness of breath and wheezing.   Cardiovascular: Negative for chest pain and palpitations.  Gastrointestinal: Negative for abdominal pain, blood in stool, constipation and diarrhea.  Endocrine: Negative for polydipsia and polyuria.  Genitourinary: Positive for menstrual problem. Negative for dysuria, frequency, pelvic pain and urgency.       Occ L pelvic area pain - ? If this is msk or other    Musculoskeletal: Negative for arthralgias, back pain and myalgias.  Skin: Negative for pallor and rash.  Allergic/Immunologic: Negative for environmental allergies.  Neurological: Negative for dizziness, tremors, syncope and headaches.  Hematological: Negative for adenopathy. Does not bruise/bleed easily.  Psychiatric/Behavioral: Negative for decreased concentration and dysphoric mood. The patient is not nervous/anxious.        Objective:   Physical Exam Constitutional:      General: She is not in acute distress.    Appearance: Normal appearance. She is obese. She is not ill-appearing.  Eyes:     General: No scleral icterus.    Conjunctiva/sclera: Conjunctivae normal.     Pupils: Pupils are equal, round, and reactive to light.  Cardiovascular:     Rate and Rhythm: Normal rate and regular rhythm.     Heart sounds: Normal heart sounds.  Pulmonary:     Effort: Pulmonary effort is normal. No respiratory distress.     Breath sounds: Normal breath sounds. No wheezing.  Abdominal:     General: Abdomen is flat. Bowel sounds are normal. There is no distension.     Palpations: Abdomen is soft. There is no mass.     Tenderness: There is no abdominal tenderness. There is no right CVA tenderness, left CVA tenderness or guarding.  Genitourinary:    Comments:          Anus appears normal w/o hemorrhoids or masses     External genitalia : nl appearance and hair distribution/no lesions     Urethral meatus : nl size, no lesions or prolapse     Urethra: no masses, tenderness or scarring    Bladder : no masses or tenderness     Vagina: nl general appearance, no discharge or  Lesions, no significant cystocele  or rectocele     Cervix: no lesions/ discharge or friability No blood at os or anywhere in vagina    Uterus: nl size, contour, position, and mobility (not fixed) , non tender    Adnexa : no masses, tenderness, enlargement or nodularity       Musculoskeletal:     Cervical back: Normal range  of motion and neck supple. No tenderness.     Right lower leg: No edema.     Left lower leg: No edema.  Lymphadenopathy:     Cervical: No cervical adenopathy.  Skin:    Coloration: Skin is not pale.     Findings: No erythema or rash.  Neurological:     Mental Status: She is alert.  Psychiatric:        Mood and Affect: Mood normal.           Assessment & Plan:   Problem List Items Addressed This Visit      Other   Post-menopausal bleeding    One brief episode of spotting  Menopause in 65s  Pt is obese  Nl exam today and no bleeding  Labs drawn incl FSH and LH to determine menopause status  Also pelvic US ordered to look at endometrial lining (risk of hypertrophy due to weight)   Lab Orders     CBC with Differential/Platelet     Follicle Stimulating Hormone     Luteinizing hormone     hCG, serum, qualitative       Relevant Orders   CBC with Differential/Platelet   Follicle Stimulating Hormone   Luteinizing hormone   hCG, serum, qualitative   US Pelvic Complete With Transvaginal   Pelvic joint pain, left    occ pain in L groin  No pain on exam  Pelvic US ordered for spotting

## 2020-06-24 NOTE — Assessment & Plan Note (Signed)
occ pain in L groin  No pain on exam  Pelvic US ordered for spotting

## 2020-06-24 NOTE — Patient Instructions (Signed)
Hormone labs today   I ordered a pelvic ultrasound to look at ovaries and uterine lining   We will make a plan based on results   If bleeding comes back let me know

## 2020-06-24 NOTE — Assessment & Plan Note (Signed)
One brief episode of spotting  Menopause in 13s  Pt is obese  Nl exam today and no bleeding  Labs drawn incl FSH and LH to determine menopause status  Also pelvic US ordered to look at endometrial lining (risk of hypertrophy due to weight)   Lab Orders     CBC with Differential/Platelet     Follicle Stimulating Hormone     Luteinizing hormone     hCG, serum, qualitative

## 2020-06-26 ENCOUNTER — Ambulatory Visit
Admission: RE | Admit: 2020-06-26 | Discharge: 2020-06-26 | Disposition: A | Discharge status: Home / Self Care | Payer: 59 | Source: Ambulatory Visit

## 2020-06-26 ENCOUNTER — Other Ambulatory Visit: Payer: Self-pay

## 2020-06-26 DIAGNOSIS — Z1231 Encounter for screening mammogram for malignant neoplasm of breast: Secondary | ICD-10-CM

## 2020-06-30 ENCOUNTER — Other Ambulatory Visit: Payer: Self-pay

## 2020-06-30 ENCOUNTER — Ambulatory Visit
Admission: RE | Admit: 2020-06-30 | Discharge: 2020-06-30 | Disposition: A | Discharge status: Home / Self Care | Payer: 59 | Source: Ambulatory Visit | Attending: Family Medicine | Admitting: Family Medicine

## 2020-06-30 DIAGNOSIS — N950 Postmenopausal bleeding: Secondary | ICD-10-CM | POA: Insufficient documentation

## 2020-07-01 ENCOUNTER — Telehealth: Payer: Self-pay | Admitting: Family Medicine

## 2020-07-01 DIAGNOSIS — N950 Postmenopausal bleeding: Secondary | ICD-10-CM

## 2020-07-01 DIAGNOSIS — D259 Leiomyoma of uterus, unspecified: Secondary | ICD-10-CM

## 2020-07-01 NOTE — Telephone Encounter (Signed)
-----   Message from Shon Millet, [place] sent at 07/01/2020 11:24 AM EST ----- Pt advised of [place] results and Dr.Laasya Peyton's comments. Pt agrees with GYN referral she said [place] is easier for her to get to but if she has to see a GYN in [place] it's okay also. I advise pt PCP will put referral in and our [facility] will call to schedule appt with her

## 2020-07-22 ENCOUNTER — Other Ambulatory Visit: Payer: Self-pay

## 2020-07-22 ENCOUNTER — Ambulatory Visit (INDEPENDENT_AMBULATORY_CARE_PROVIDER_SITE_OTHER): Payer: 59 | Admitting: Obstetrics and Gynecology

## 2020-07-22 ENCOUNTER — Encounter: Payer: Self-pay | Admitting: Obstetrics and Gynecology

## 2020-07-22 VITALS — BP 126/84 | HR 89 | Ht 67.0 in | Wt 260.1 lb

## 2020-07-22 DIAGNOSIS — N950 Postmenopausal bleeding: Secondary | ICD-10-CM

## 2020-07-22 DIAGNOSIS — D219 Benign neoplasm of connective and other soft tissue, unspecified: Secondary | ICD-10-CM

## 2020-07-22 NOTE — Progress Notes (Signed)
HPI:      Ms. Ashley Mercado is a 50 y.o. No obstetric history on file. who LMP was Patient's last menstrual period was 12/28/2011.  Subjective:   She presents today after having approximately 2 weeks of very light vaginal bleeding which occurred 2 to 3 weeks ago.  The bleeding has since resolved.  She denies cramping or other symptoms.  She states she has been in menopause for approximately 5 to 6 years and has had no bleeding during that time.  She denies postcoital bleeding.    Hx: The following portions of the patient's history were reviewed and updated as appropriate:             She  has a past medical history of Folliculitis, HSV infection, Hydradenitis, Hypertension, Keloid scar, and Menorrhagia. She does not have any pertinent problems on file. She  has a past surgical history that includes Colonoscopy with propofol (N/A, 06/04/2019). Her family history includes Cancer in her maternal grandmother and maternal uncle; Diabetes in her father and mother; Hypertension in her father and mother. She  reports that she has never smoked. She has never used smokeless tobacco. She reports current alcohol use. She reports that she does not use drugs. She has a current medication list which includes the following prescription(s): dymista, epipen 2-pak, levocetirizine, montelukast, multiple vitamins-minerals, omeprazole, and spironolactone. She is allergic to dust mite extract, fish allergy, other, shrimp extract allergy skin test, and sulfa antibiotics.       Review of Systems:  Review of Systems  Constitutional: Denied constitutional symptoms, night sweats, recent illness, fatigue, fever, insomnia and weight loss.  Eyes: Denied eye symptoms, eye pain, photophobia, vision change and visual disturbance.  Ears/Nose/Throat/Neck: Denied ear, nose, throat or neck symptoms, hearing loss, nasal discharge, sinus congestion and sore throat.  Cardiovascular: Denied cardiovascular symptoms, arrhythmia, chest  pain/pressure, edema, exercise intolerance, orthopnea and palpitations.  Respiratory: Denied pulmonary symptoms, asthma, pleuritic pain, productive sputum, cough, dyspnea and wheezing.  Gastrointestinal: Denied, gastro-esophageal reflux, melena, nausea and vomiting.  Genitourinary: See HPI for additional information.  Musculoskeletal: Denied musculoskeletal symptoms, stiffness, swelling, muscle weakness and myalgia.  Dermatologic: Denied dermatology symptoms, rash and scar.  Neurologic: Denied neurology symptoms, dizziness, headache, neck pain and syncope.  Psychiatric: Denied psychiatric symptoms, anxiety and depression.  Endocrine: Denied endocrine symptoms including hot flashes and night sweats.   Meds:   Current Outpatient Medications on File Prior to Visit  Medication Sig Dispense Refill   DYMISTA 137-50 MCG/ACT SUSP      EPIPEN 2-PAK 0.3 MG/0.3ML SOAJ injection      levocetirizine (XYZAL) 5 MG tablet Take 2.5 mg by mouth.      montelukast (SINGULAIR) 10 MG tablet      Multiple Vitamins-Minerals (MULTIVITAMIN GUMMIES ADULT PO) Take 2 each by mouth daily.     omeprazole (PRILOSEC) 20 MG capsule Take 1 capsule (20 mg total) by mouth daily. 30 capsule 11   spironolactone (ALDACTONE) 50 MG tablet TAKE 1 TABLET (50 MG TOTAL) BY MOUTH DAILY. 30 tablet 11   No current facility-administered medications on file prior to visit.          Objective:     Vitals:   07/22/20 1329  BP: 126/84  Pulse: 89   Filed Weights   07/22/20 1329  Weight: 260 lb 1.6 oz (118 kg)              Ultrasound results reviewed directly with the patient  Submucosal fibroid  Endometrial thickness 2 mm  Assessment:    No obstetric history on file. Patient Active Problem List   Diagnosis Date Noted   Uterine fibroid 07/01/2020   Post-menopausal bleeding 06/24/2020   Pelvic joint pain, left 06/24/2020   Colon cancer screening 04/17/2019   Prediabetes 04/06/2017   Carpal tunnel  syndrome 11/10/2016   Allergic rhinitis 11/10/2016   Encounter for routine gynecological examination 03/10/2016   Screening for HIV (human immunodeficiency virus) 03/07/2015   Essential hypertension 10/28/2014   PCOS (polycystic ovarian syndrome) 10/28/2014   Stress reaction 03/01/2014   Other screening mammogram 02/18/2012   Obesity (BMI 30-39.9) 10/20/2011   Elevated blood-pressure reading without diagnosis of hypertension 10/20/2011   Routine general medical examination at a health care facility 02/06/2011   Depression 11/04/2010   HIDRADENITIS SUPPURATIVA 05/12/2010   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   Elevated platelet count 11/30/2006   KELOID SCAR 74/25/9563   FOLLICULITIS 87/56/4332     1. Postmenopausal bleeding   2. Fibroids     Patient has had 1 episode of very light postmenopausal bleeding.  Her ultrasound at 2 mm thickness of endometrium reveals no risk of endometrial hyperplasia or cancer.  Her bleeding is likely secondary to the submucosal fibroid.   Plan:            1.  I have reassured her regarding her postmenopausal bleeding.  Her risk of hyperplasia or malignancy is extremely low.  No further work-up necessary at this time.  I have stressed that if her bleeding returns that further work-up may be necessary.  She is to contact us if she continues to have bleeding.  Check for endocervical polyp and possible endometrial biopsy at that time. All questions answered. Orders No orders of the defined types were placed in this encounter.   No orders of the defined types were placed in this encounter.     F/U  No follow-ups on file. I spent 31 minutes involved in the care of this patient preparing to see the patient by obtaining and reviewing her medical history (including labs, imaging tests and prior procedures), documenting clinical information in the electronic health record (EHR), counseling and coordinating care plans, writing and sending prescriptions, ordering  tests or procedures and directly communicating with the patient by discussing pertinent items from her history and physical exam as well as detailing my assessment and plan as noted above so that she has an informed understanding.  All of her questions were answered.  Finis Bud, M.D. 07/22/2020 1:51 PM

## 2021-01-07 ENCOUNTER — Telehealth: Payer: Self-pay | Admitting: Family Medicine

## 2021-01-07 DIAGNOSIS — R7303 Prediabetes: Secondary | ICD-10-CM

## 2021-01-07 DIAGNOSIS — I10 Essential (primary) hypertension: Secondary | ICD-10-CM

## 2021-01-07 DIAGNOSIS — Z0000 Encounter for general adult medical examination without abnormal findings: Secondary | ICD-10-CM

## 2021-01-07 NOTE — Telephone Encounter (Signed)
-----   Message from Ellamae Sia sent at 12/22/2020 11:52 AM EDT ----- Regarding: Lab orders for Thursday, 7.7.22 Patient is scheduled for CPX labs, please order future labs, Thanks , Karna Christmas

## 2021-01-08 ENCOUNTER — Other Ambulatory Visit: Payer: 59

## 2021-01-16 ENCOUNTER — Encounter: Payer: 59 | Admitting: Family Medicine

## 2021-01-23 ENCOUNTER — Encounter: Payer: 59 | Admitting: Family Medicine

## 2021-02-02 ENCOUNTER — Other Ambulatory Visit: Payer: Self-pay

## 2021-02-02 ENCOUNTER — Other Ambulatory Visit (INDEPENDENT_AMBULATORY_CARE_PROVIDER_SITE_OTHER): Payer: Self-pay

## 2021-02-02 DIAGNOSIS — R7303 Prediabetes: Secondary | ICD-10-CM

## 2021-02-02 DIAGNOSIS — I10 Essential (primary) hypertension: Secondary | ICD-10-CM

## 2021-02-02 LAB — CBC WITH DIFFERENTIAL/PLATELET
Basophils Absolute: 0 10*3/uL (ref 0.0–0.1)
Basophils Relative: 0.7 % (ref 0.0–3.0)
Eosinophils Absolute: 0 10*3/uL (ref 0.0–0.7)
Eosinophils Relative: 0.8 % (ref 0.0–5.0)
HCT: 43.4 % (ref 36.0–46.0)
Hemoglobin: 14.5 g/dL (ref 12.0–15.0)
Lymphocytes Relative: 26.6 % (ref 12.0–46.0)
Lymphs Abs: 1.3 10*3/uL (ref 0.7–4.0)
MCHC: 33.4 g/dL (ref 30.0–36.0)
MCV: 86.6 fl (ref 78.0–100.0)
Monocytes Absolute: 0.4 10*3/uL (ref 0.1–1.0)
Monocytes Relative: 8.1 % (ref 3.0–12.0)
Neutro Abs: 3.2 10*3/uL (ref 1.4–7.7)
Neutrophils Relative %: 63.8 % (ref 43.0–77.0)
Platelets: 394 10*3/uL (ref 150.0–400.0)
RBC: 5.01 Mil/uL (ref 3.87–5.11)
RDW: 14 % (ref 11.5–15.5)
WBC: 5 10*3/uL (ref 4.0–10.5)

## 2021-02-02 LAB — HEMOGLOBIN A1C: Hgb A1c MFr Bld: 6.1 % (ref 4.6–6.5)

## 2021-02-02 LAB — COMPREHENSIVE METABOLIC PANEL
ALT: 12 U/L (ref 0–35)
AST: 12 U/L (ref 0–37)
Albumin: 4.2 g/dL (ref 3.5–5.2)
Alkaline Phosphatase: 79 U/L (ref 39–117)
BUN: 14 mg/dL (ref 6–23)
CO2: 29 mEq/L (ref 19–32)
Calcium: 9.6 mg/dL (ref 8.4–10.5)
Chloride: 102 mEq/L (ref 96–112)
Creatinine, Ser: 0.97 mg/dL (ref 0.40–1.20)
GFR: 67.3 mL/min (ref >60.00)
Glucose, Bld: 101 mg/dL — ABNORMAL HIGH (ref 70–99)
Potassium: 4.7 mEq/L (ref 3.5–5.1)
Sodium: 140 mEq/L (ref 135–145)
Total Bilirubin: 0.4 mg/dL (ref 0.2–1.2)
Total Protein: 7.9 g/dL (ref 6.0–8.3)

## 2021-02-02 LAB — LIPID PANEL
Cholesterol: 178 mg/dL (ref 0–200)
HDL: 52.4 mg/dL (ref >39.00)
LDL Cholesterol: 113 mg/dL — ABNORMAL HIGH (ref 0–99)
NonHDL: 126
Total CHOL/HDL Ratio: 3
Triglycerides: 66 mg/dL (ref 0.0–149.0)
VLDL: 13.2 mg/dL (ref 0.0–40.0)

## 2021-02-02 LAB — TSH: TSH: 1.88 u[IU]/mL (ref 0.35–5.50)

## 2021-02-09 ENCOUNTER — Encounter: Payer: Self-pay | Admitting: Family Medicine

## 2021-03-03 ENCOUNTER — Encounter: Payer: Self-pay | Admitting: Family Medicine

## 2021-03-03 ENCOUNTER — Ambulatory Visit (INDEPENDENT_AMBULATORY_CARE_PROVIDER_SITE_OTHER): Payer: BC Managed Care – PPO | Admitting: Family Medicine

## 2021-03-03 ENCOUNTER — Other Ambulatory Visit: Payer: Self-pay

## 2021-03-03 VITALS — BP 130/84 | HR 92 | Temp 98.4°F | Ht 67.0 in | Wt 266.2 lb

## 2021-03-03 DIAGNOSIS — R7303 Prediabetes: Secondary | ICD-10-CM

## 2021-03-03 DIAGNOSIS — Z0000 Encounter for general adult medical examination without abnormal findings: Secondary | ICD-10-CM

## 2021-03-03 DIAGNOSIS — I10 Essential (primary) hypertension: Secondary | ICD-10-CM | POA: Diagnosis not present

## 2021-03-03 DIAGNOSIS — Z23 Encounter for immunization: Secondary | ICD-10-CM | POA: Diagnosis not present

## 2021-03-03 DIAGNOSIS — E282 Polycystic ovarian syndrome: Secondary | ICD-10-CM

## 2021-03-03 DIAGNOSIS — R7989 Other specified abnormal findings of blood chemistry: Secondary | ICD-10-CM

## 2021-03-03 MED ORDER — SPIRONOLACTONE 50 MG PO TABS: ORAL_TABLET | ORAL | 3 refills | Status: DC

## 2021-03-03 NOTE — Assessment & Plan Note (Signed)
Lab Results  Component Value Date   HGBA1C 6.1 02/02/2021   disc imp of low glycemic diet and wt loss to prevent DM2

## 2021-03-03 NOTE — Progress Notes (Signed)
Subjective:    Patient ID: Ashley Mercado, female    DOB: 08-Apr-1971, 50 y.o.   MRN: [id number]  This visit occurred during the SARS-CoV-2 public health emergency.  Safety protocols were in place, including screening questions prior to the visit, additional usage of staff PPE, and extensive cleaning of exam room while observing appropriate contact time as indicated for disinfecting solutions.   HPI Here for health maintenance exam and to review chronic medical problems    Wt Readings from Last 3 Encounters:  03/03/21 266 lb 3 oz (120.7 kg)  07/22/20 260 lb 1.6 oz (118 kg)  06/24/20 258 lb 9 oz (117.3 kg)   41.69 kg/m  Doing ok overall  Time off upcoming /will go to [place] (works for [employer])  Feeling ok   Trying to take care of herself   Zoster status -had the shingrix vaccines at [facility] with booster  Wants flu shot today  Tdap 10/18    Mammogram 12/21 Self breast exam -no lumps   Pap 10/20 negative  Post menopausal , no periods  Some night hot flashes  H/o PCOS  Ca and D- takes mvi daily  No fractures  Exercise - some weight lifting and bike when she can  Has been busy and it is time to schedule   Trying to keep weight off -it is a struggle  Her job feeds them every day  Tries to keep portions small  Perhaps more sweets  Does some smoothies with whole fruits   Colonoscopy 11/20 with 3 y recall   HTN bp is stable today  No cp or palpitations or headaches or edema  No side effects to medicines  BP Readings from Last 3 Encounters:  03/03/21 130/84  07/22/20 126/84  06/24/20 140/80    Spironolactone 50 mg daily   Pulse Readings from Last 3 Encounters:  03/03/21 92  07/22/20 89  06/24/20 79   Lab Results  Component Value Date   CREATININE 0.97 02/02/2021   BUN 14 02/02/2021   NA 140 02/02/2021   K 4.7 02/02/2021   CL 102 02/02/2021   CO2 29 02/02/2021   Prediabetes in setting of pcos Lab Results  Component Value Date   HGBA1C 6.1  02/02/2021   Up from 5.6  Lab Results  Component Value Date   ALT 12 02/02/2021   AST 12 02/02/2021   ALKPHOS 79 02/02/2021   BILITOT 0.4 02/02/2021   Lab Results  Component Value Date   WBC 5.0 02/02/2021   HGB 14.5 02/02/2021   HCT 43.4 02/02/2021   MCV 86.6 02/02/2021   PLT 394.0 02/02/2021   Lab Results  Component Value Date   TSH 1.88 02/02/2021    Cholesterol Lab Results  Component Value Date   CHOL 178 02/02/2021   CHOL 196 01/09/2020   CHOL 188 04/13/2019   Lab Results  Component Value Date   HDL 52.40 02/02/2021   HDL 53.40 01/09/2020   HDL 41.10 04/13/2019   Lab Results  Component Value Date   LDLCALC 113 (H) 02/02/2021   LDLCALC 127 (H) 01/09/2020   LDLCALC 131 (H) 04/13/2019   Lab Results  Component Value Date   TRIG 66.0 02/02/2021   TRIG 77.0 01/09/2020   TRIG 76.0 04/13/2019   Lab Results  Component Value Date   CHOLHDL 3 02/02/2021   CHOLHDL 4 01/09/2020   CHOLHDL 5 04/13/2019   No results found for: LDLDIRECT Occ fried food Had ribs before her  labs  Not often   Patient Active Problem List   Diagnosis Date Noted   Uterine fibroid 07/01/2020   Post-menopausal bleeding 06/24/2020   Pelvic joint pain, left 06/24/2020   Colon cancer screening 04/17/2019   Prediabetes 04/06/2017   Carpal tunnel syndrome 11/10/2016   Allergic rhinitis 11/10/2016   Encounter for routine gynecological examination 03/10/2016   Screening for HIV (human immunodeficiency virus) 03/07/2015   Essential hypertension 10/28/2014   PCOS (polycystic ovarian syndrome) 10/28/2014   Stress reaction 03/01/2014   Other screening mammogram 02/18/2012   Morbid obesity ([place]) 10/20/2011   Routine general medical examination at a health care facility 02/06/2011   Depression 11/04/2010   HIDRADENITIS SUPPURATIVA 05/12/2010   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   Elevated platelet count 11/30/2006   KELOID SCAR Q000111Q   FOLLICULITIS Q000111Q   Past Medical History:  Diagnosis  Date   Folliculitis    HSV infection    Hydradenitis    Hypertension    Keloid scar    Menorrhagia    Past Surgical History:  Procedure Laterality Date   COLONOSCOPY WITH PROPOFOL N/A 06/04/2019   Procedure: COLONOSCOPY WITH PROPOFOL;  Surgeon: Jonathon Bellows, MD;  Location: [facility] ENDOSCOPY;  Service: Gastroenterology;  Laterality: N/A;   Social History   Tobacco Use   Smoking status: Never   Smokeless tobacco: Never  Vaping Use   Vaping Use: Never used  Substance Use Topics   Alcohol use: Yes    Alcohol/week: 0.0 standard drinks    Comment: rare   Drug use: No   Family History  Problem Relation Age of Onset   Hypertension Father    Diabetes Father    Hypertension Mother    Diabetes Mother    Cancer Maternal Uncle        lymphoma   Cancer Maternal Grandmother        ovarian   Allergies  Allergen Reactions   Dust Mite Extract Cough and Itching   Fish Allergy Hives    *CATFISH*   Other     artificial sweeteners   Shrimp Extract Allergy Skin Test Itching and Swelling   Sulfa Antibiotics Rash   Current Outpatient Medications on File Prior to Visit  Medication Sig Dispense Refill   DYMISTA 137-50 MCG/ACT SUSP      EPIPEN 2-PAK 0.3 MG/0.3ML SOAJ injection      levocetirizine (XYZAL) 5 MG tablet Take 2.5 mg by mouth.      montelukast (SINGULAIR) 10 MG tablet      Multiple Vitamins-Minerals (MULTIVITAMIN GUMMIES ADULT PO) Take 2 each by mouth daily.     omeprazole (PRILOSEC) 20 MG capsule Take 1 capsule (20 mg total) by mouth daily. 30 capsule 11   No current facility-administered medications on file prior to visit.     Review of Systems  Constitutional:  Negative for activity change, appetite change, fatigue, fever and unexpected weight change.  HENT:  Negative for congestion, ear pain, rhinorrhea, sinus pressure and sore throat.   Eyes:  Negative for pain, redness and visual disturbance.  Respiratory:  Negative for cough, shortness of breath and wheezing.    Cardiovascular:  Negative for chest pain and palpitations.  Gastrointestinal:  Negative for abdominal pain, blood in stool, constipation and diarrhea.  Endocrine: Negative for polydipsia and polyuria.  Genitourinary:  Negative for dysuria, frequency, urgency and vaginal bleeding.  Musculoskeletal:  Negative for arthralgias, back pain and myalgias.  Skin:  Negative for pallor and rash.  Allergic/Immunologic: Negative for environmental allergies.  Neurological:  Negative for dizziness, syncope and headaches.  Hematological:  Negative for adenopathy. Does not bruise/bleed easily.  Psychiatric/Behavioral:  Negative for decreased concentration and dysphoric mood. The patient is not nervous/anxious.       Objective:   Physical Exam Constitutional:      General: She is not in acute distress.    Appearance: Normal appearance. She is well-developed. She is obese. She is not ill-appearing or diaphoretic.  HENT:     Head: Normocephalic and atraumatic.     Right Ear: Tympanic membrane, ear canal and external ear normal.     Left Ear: Tympanic membrane, ear canal and external ear normal.     Nose: Nose normal. No congestion.     Mouth/Throat:     Mouth: Mucous membranes are moist.     Pharynx: Oropharynx is clear. No posterior oropharyngeal erythema.  Eyes:     General: No scleral icterus.    Extraocular Movements: Extraocular movements intact.     Conjunctiva/sclera: Conjunctivae normal.     Pupils: Pupils are equal, round, and reactive to light.  Neck:     Thyroid: No thyromegaly.     Vascular: No carotid bruit or JVD.  Cardiovascular:     Rate and Rhythm: Normal rate and regular rhythm.     Pulses: Normal pulses.     Heart sounds: Normal heart sounds.    No gallop.  Pulmonary:     Effort: Pulmonary effort is normal. No respiratory distress.     Breath sounds: Normal breath sounds. No wheezing.     Comments: Good air exch Chest:     Chest wall: No tenderness.  Abdominal:      General: Bowel sounds are normal. There is no distension or abdominal bruit.     Palpations: Abdomen is soft. There is no mass.     Tenderness: There is no abdominal tenderness.     Hernia: No hernia is present.  Genitourinary:    Comments: Breast exam: No mass, nodules, thickening, tenderness, bulging, retraction, inflamation, nipple discharge or skin changes noted.  No axillary or clavicular LA.     Musculoskeletal:        General: No tenderness. Normal range of motion.     Cervical back: Normal range of motion and neck supple. No rigidity. No muscular tenderness.     Right lower leg: No edema.     Left lower leg: No edema.  Lymphadenopathy:     Cervical: No cervical adenopathy.  Skin:    General: Skin is warm and dry.     Coloration: Skin is not pale.     Findings: No erythema or rash.     Comments: Few skin tags  Neurological:     Mental Status: She is alert. Mental status is at baseline.     Cranial Nerves: No cranial nerve deficit.     Motor: No abnormal muscle tone.     Coordination: Coordination normal.     Gait: Gait normal.     Deep Tendon Reflexes: Reflexes are normal and symmetric. Reflexes normal.  Psychiatric:        Mood and Affect: Mood normal.        Cognition and Memory: Cognition and memory normal.          Assessment & Plan:   Problem List Items Addressed This Visit       Cardiovascular and Mediastinum   Essential hypertension    bp in fair control at this time  BP Readings from Last 1  Encounters:  03/03/21 130/84  No changes needed Most recent labs reviewed  Disc lifstyle change with low sodium diet and exercise  Plan to continue spironolactone 50 mg daily      Relevant Medications   spironolactone (ALDACTONE) 50 MG tablet     Endocrine   PCOS (polycystic ovarian syndrome)    This has caused more difficulty loosing wt  Also insulin resistance Lab Results  Component Value Date   HGBA1C 6.1 02/02/2021   Spironolactone refilled  Ref made  to [facility]         Other   Elevated platelet count    Normal platelet count recently      Routine general medical examination at a health care facility - Primary    Reviewed health habits including diet and exercise and skin cancer prevention Reviewed appropriate screening tests for age  Also reviewed health mt list, fam hx and immunization status , as well as social and family history   See HPI Labs reviewed Flu shot given  covid immunized  Mammogram utd Pap utd  Encouraged to start ca and D for bone health along with exercise  Ref made to [facility] center to help with wt loss  Reviewed healthy diet for cholesterol      Morbid obesity ([place])    Discussed how this problem influences overall health and the risks it imposes  Reviewed plan for weight loss with lower calorie diet (via better food choices and also portion control or program like weight watchers) and exercise building up to or more than 30 minutes 5 days per week including some aerobic activity   ref made to the [facility]       Relevant Orders   Amb Ref to Medical Weight Management   Prediabetes    Lab Results  Component Value Date   HGBA1C 6.1 02/02/2021  disc imp of low glycemic diet and wt loss to prevent DM2       Other Visit Diagnoses     Need for influenza vaccination       Relevant Orders   Flu Vaccine QUAD 6+ mos PF IM (Fluarix Quad PF) (Completed)

## 2021-03-03 NOTE — Assessment & Plan Note (Signed)
bp in fair control at this time  BP Readings from Last 1 Encounters:  03/03/21 130/84   No changes needed Most recent labs reviewed  Disc lifstyle change with low sodium diet and exercise  Plan to continue spironolactone 50 mg daily

## 2021-03-03 NOTE — Assessment & Plan Note (Signed)
This has caused more difficulty loosing wt  Also insulin resistance Lab Results  Component Value Date   HGBA1C 6.1 02/02/2021   Spironolactone refilled  Ref made to [facility]

## 2021-03-03 NOTE — Assessment & Plan Note (Signed)
Normal platelet count recently

## 2021-03-03 NOTE — Assessment & Plan Note (Signed)
Reviewed health habits including diet and exercise and skin cancer prevention Reviewed appropriate screening tests for age  Also reviewed health mt list, fam hx and immunization status , as well as social and family history   See HPI Labs reviewed Flu shot given  covid immunized  Mammogram utd Pap utd  Encouraged to start ca and D for bone health along with exercise  Ref made to [facility] wellness center to help with wt loss  Reviewed healthy diet for cholesterol

## 2021-03-03 NOTE — Patient Instructions (Addendum)
Try to get 1200-1500 mg of calcium per day with at least 1000 iu of vitamin D - for bone health  You have to schedule exercise in a routine  Mornings may be easier to schedule   Try to get most of your carbohydrates from produce (with the exception of white potatoes)  Eat less bread/pasta/rice/snack foods/cereals/sweets and other items from the middle of the grocery store (processed carbs)  I will place a referral to the healthy weight and wellness center You will get a call  There is a waiting list   For cholesterol Avoid red meat/ fried foods/ egg yolks/ fatty breakfast meats/ butter, cheese and high fat dairy/ and shellfish

## 2021-03-03 NOTE — Assessment & Plan Note (Signed)
Discussed how this problem influences overall health and the risks it imposes  Reviewed plan for weight loss with lower calorie diet (via better food choices and also portion control or program like weight watchers) and exercise building up to or more than 30 minutes 5 days per week including some aerobic activity   ref made to the healthy [facility] wellness center

## 2021-05-08 ENCOUNTER — Ambulatory Visit: Payer: Self-pay

## 2021-05-08 ENCOUNTER — Other Ambulatory Visit: Payer: Self-pay

## 2021-05-08 ENCOUNTER — Encounter: Payer: Self-pay | Admitting: Emergency Medicine

## 2021-05-08 ENCOUNTER — Ambulatory Visit
Admission: EM | Admit: 2021-05-08 | Discharge: 2021-05-08 | Disposition: A | Discharge status: Home / Self Care | Payer: BC Managed Care – PPO

## 2021-05-08 DIAGNOSIS — M79642 Pain in left hand: Secondary | ICD-10-CM

## 2021-05-08 DIAGNOSIS — M25532 Pain in left wrist: Secondary | ICD-10-CM

## 2021-05-08 NOTE — ED Triage Notes (Signed)
Pt c/o left thumb and wrist pain x 3 days. Pt denies any injury.

## 2021-05-08 NOTE — ED Provider Notes (Signed)
Roderic Palau    CSN: [id number] Arrival date & time: 05/08/21  3212      History   Chief Complaint Chief Complaint  Patient presents with   Hand Pain    HPI Ashley Mercado is a 50 y.o. female.  Patient presents with 3 to 4-day history of left hand and wrist pain.  No falls or injury.  No numbness, paresthesias, wounds, redness, bruising, fever, or other symptoms.  Treatment at home with ibuprofen.  She felt the pain while driving to work today and felt that her handgrip was weak when she attempted to pick up her coffee while at work.  Her medical history includes hypertension, morbid obesity, prediabetes.  The history is provided by the patient and medical records.   Past Medical History:  Diagnosis Date   Folliculitis    HSV infection    Hydradenitis    Hypertension    Keloid scar    Menorrhagia     Patient Active Problem List   Diagnosis Date Noted   Uterine fibroid 07/01/2020   Post-menopausal bleeding 06/24/2020   Pelvic joint pain, left 06/24/2020   Colon cancer screening 04/17/2019   Prediabetes 04/06/2017   Carpal tunnel syndrome 11/10/2016   Allergic rhinitis 11/10/2016   Encounter for routine gynecological examination 03/10/2016   Screening for HIV (human immunodeficiency virus) 03/07/2015   Essential hypertension 10/28/2014   PCOS (polycystic ovarian syndrome) 10/28/2014   Stress reaction 03/01/2014   Other screening mammogram 02/18/2012   Morbid obesity ([place]) 10/20/2011   Routine general medical examination at a health care facility 02/06/2011   Depression 11/04/2010   HIDRADENITIS SUPPURATIVA 05/12/2010   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   Elevated platelet count 11/30/2006   KELOID SCAR 24/82/5003   FOLLICULITIS 70/48/8891    Past Surgical History:  Procedure Laterality Date   COLONOSCOPY WITH PROPOFOL N/A 06/04/2019   Procedure: COLONOSCOPY WITH PROPOFOL;  Surgeon: Jonathon Bellows, MD;  Location[facility] ENDOSCOPY;  Service: Gastroenterology;  Laterality: N/A;     OB History   No obstetric history on file.      Home Medications    Prior to Admission medications   Medication Sig Start Date End Date Taking? Authorizing Provider  [place] 137-50 MCG/ACT SUSP  01/13/17  Yes [provider]  EPIPEN 2-PAK 0.3 MG/0.3ML SOAJ injection  01/13/17  Yes [provider]  levocetirizine (XYZAL) 5 MG tablet Take 2.5 mg by mouth.  01/13/17  Yes [provider]  montelukast (SINGULAIR) 10 MG tablet  01/13/17  Yes [provider]  Multiple Vitamins-Minerals (MULTIVITAMIN GUMMIES ADULT PO) Take 2 each by mouth daily.   Yes [provider]  omeprazole (PRILOSEC) 20 MG capsule Take 1 capsule (20 mg total) by mouth daily. 04/17/19  Yes Tower, Wynelle Fanny, MD  spironolactone (ALDACTONE) 50 MG tablet TAKE 1 TABLET (50 MG TOTAL) BY MOUTH DAILY. 03/03/21  Yes Tower, Wynelle Fanny, MD    Family History Family History  Problem Relation Age of Onset   Hypertension Father    Diabetes Father    Hypertension Mother    Diabetes Mother    Cancer Maternal Uncle        lymphoma   Cancer Maternal Grandmother        ovarian    Social History Social History   Tobacco Use   Smoking status: Never   Smokeless tobacco: Never  Vaping Use   Vaping Use: Never used  Substance Use Topics   Alcohol use: Yes    Alcohol/week: 0.0 standard  drinks    Comment: rare   Drug use: No     Allergies   Dust mite extract, Fish allergy, Other, Shrimp extract allergy skin test, and Sulfa antibiotics   Review of Systems Review of Systems  Constitutional:  Negative for chills and fever.  Musculoskeletal:  Positive for arthralgias. Negative for joint swelling.  Skin:  Negative for color change, rash and wound.  Neurological:  Positive for weakness. Negative for numbness.  All other systems reviewed and are negative.   Physical Exam Triage Vital Signs ED Triage Vitals  Enc Vitals Group     BP 05/08/21 0842 117/76     Pulse Rate 05/08/21 0842  94     Resp 05/08/21 0842 18     Temp 05/08/21 0842 98 F (36.7 C)     Temp Source 05/08/21 0842 Oral     SpO2 05/08/21 0842 97 %     Weight --      Height --      Head Circumference --      Peak Flow --      Pain Score 05/08/21 0844 5     Pain Loc --      Pain Edu? --      Excl. in [place]? --    No data found.  Updated Vital Signs BP 117/76 (BP Location: Left Arm)   Pulse 94   Temp 98 F (36.7 C) (Oral)   Resp 18   LMP 12/28/2011   SpO2 97%   Visual Acuity Right Eye Distance:   Left Eye Distance:   Bilateral Distance:    Right Eye Near:   Left Eye Near:    Bilateral Near:     Physical Exam Vitals and nursing note reviewed.  Constitutional:      General: She is not in acute distress.    Appearance: She is well-developed. She is not ill-appearing.  HENT:     Head: Normocephalic and atraumatic.     Mouth/Throat:     Mouth: Mucous membranes are moist.  Eyes:     Conjunctiva/sclera: Conjunctivae normal.  Cardiovascular:     Rate and Rhythm: Normal rate and regular rhythm.     Heart sounds: Normal heart sounds.  Pulmonary:     Effort: Pulmonary effort is normal. No respiratory distress.     Breath sounds: Normal breath sounds.  Musculoskeletal:        General: No swelling, tenderness, deformity or signs of injury. Normal range of motion.     Cervical back: Neck supple.     Comments: Left hand and wrist: Strength 5/5, sensation intact, strong handgrip, FROM.  No wounds or erythema, ecchymosis.  Skin:    General: Skin is warm and dry.     Capillary Refill: Capillary refill takes less than 2 seconds.     Findings: No bruising, erythema, lesion or rash.  Neurological:     General: No focal deficit present.     Mental Status: She is alert and oriented to person, place, and time.     Sensory: No sensory deficit.     Motor: No weakness.     Gait: Gait normal.  Psychiatric:        Mood and Affect: Mood normal.        Behavior: Behavior normal.     UC Treatments  / Results  Labs (all labs ordered are listed, but only abnormal results are displayed) Labs Reviewed - No data to display  EKG   Radiology No results  found.  Procedures Procedures (including critical care time)  Medications Ordered in UC Medications - No data to display  Initial Impression / Assessment and Plan / UC Course  I have reviewed the triage vital signs and the nursing notes.  Pertinent labs & imaging results that were available during my care of the patient were reviewed by me and considered in my medical decision making (see chart for details).  Left hand and wrist pain.  No indication of infection or injury.  Patient declines prescription for ibuprofen and states she will continue taking OTC.  Discussed rest of her hand and wrist, elevation, ice packs.  Instructed patient to follow-up with orthopedic hand specialist if her symptoms are not improving.  She agrees to plan of care.   Final Clinical Impressions(s) / UC Diagnoses   Final diagnoses:  Left hand pain  Left wrist pain     Discharge Instructions      Continue taking ibuprofen as needed for discomfort.  Follow-up with a hand specialist such as the one listed below if your symptoms are not improving.     ED Prescriptions   None    PDMP not reviewed this encounter.   Sharion Balloon, NP 05/08/21 [phone]

## 2021-05-08 NOTE — Discharge Instructions (Addendum)
Continue taking ibuprofen as needed for discomfort.  Follow-up with a hand specialist such as the one listed below if your symptoms are not improving.

## 2021-05-26 ENCOUNTER — Other Ambulatory Visit: Payer: Self-pay | Admitting: Family Medicine

## 2021-05-26 DIAGNOSIS — Z1231 Encounter for screening mammogram for malignant neoplasm of breast: Secondary | ICD-10-CM

## 2021-07-01 ENCOUNTER — Ambulatory Visit
Admission: RE | Admit: 2021-07-01 | Discharge: 2021-07-01 | Disposition: A | Discharge status: Home / Self Care | Payer: BC Managed Care – PPO | Source: Ambulatory Visit

## 2021-07-01 DIAGNOSIS — Z1231 Encounter for screening mammogram for malignant neoplasm of breast: Secondary | ICD-10-CM

## 2021-08-27 ENCOUNTER — Ambulatory Visit: Payer: BC Managed Care – PPO | Admitting: Sports Medicine

## 2021-08-27 ENCOUNTER — Other Ambulatory Visit: Payer: Self-pay

## 2021-08-27 ENCOUNTER — Encounter: Payer: Self-pay | Admitting: Sports Medicine

## 2021-08-27 ENCOUNTER — Ambulatory Visit (INDEPENDENT_AMBULATORY_CARE_PROVIDER_SITE_OTHER): Payer: BC Managed Care – PPO

## 2021-08-27 DIAGNOSIS — M79672 Pain in left foot: Secondary | ICD-10-CM | POA: Diagnosis not present

## 2021-08-27 DIAGNOSIS — M79671 Pain in right foot: Secondary | ICD-10-CM | POA: Diagnosis not present

## 2021-08-27 DIAGNOSIS — M779 Enthesopathy, unspecified: Secondary | ICD-10-CM

## 2021-08-27 DIAGNOSIS — M2142 Flat foot [pes planus] (acquired), left foot: Secondary | ICD-10-CM

## 2021-08-27 DIAGNOSIS — R252 Cramp and spasm: Secondary | ICD-10-CM

## 2021-08-27 DIAGNOSIS — M2141 Flat foot [pes planus] (acquired), right foot: Secondary | ICD-10-CM

## 2021-08-27 MED ORDER — PREDNISONE 10 MG (21) PO TBPK: ORAL_TABLET | ORAL | 0 refills | Status: DC

## 2021-08-27 MED ORDER — MELOXICAM 15 MG PO TABS: 15.0000 mg | ORAL_TABLET | Freq: Every day | ORAL | 0 refills | Status: DC

## 2021-08-27 MED ORDER — CYCLOBENZAPRINE HCL 10 MG PO TABS: 10.0000 mg | ORAL_TABLET | Freq: Every day | ORAL | 0 refills | Status: DC

## 2021-08-27 NOTE — Progress Notes (Signed)
Subjective: Ashley Mercado is a 51 y.o. 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  Patient also admits some cramping that occasionally happens in the right foot and has tried changing shoes over-the-counter insoles over-the-counter anti-inflammatory medications but not improving.  Patient denies any recent injury or trauma or any other pedal complaints at this time.  Patient Active Problem List   Diagnosis Date Noted   Uterine fibroid 07/01/2020   Post-menopausal bleeding 06/24/2020   Pelvic joint pain, left 06/24/2020   Colon cancer screening 04/17/2019   Prediabetes 04/06/2017   Carpal tunnel syndrome 11/10/2016   Allergic rhinitis 11/10/2016   Encounter for routine gynecological examination 03/10/2016   Screening for HIV (human immunodeficiency virus) 03/07/2015   Essential hypertension 10/28/2014   PCOS (polycystic ovarian syndrome) 10/28/2014   Stress reaction 03/01/2014   Other screening mammogram 02/18/2012   Morbid obesity ([place]) 10/20/2011   Routine general medical examination at a health care facility 02/06/2011   Depression 11/04/2010   HIDRADENITIS SUPPURATIVA 05/12/2010   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   Elevated platelet count 11/30/2006   KELOID SCAR 98/05/9146   FOLLICULITIS 82/95/6213    Current Outpatient Medications on File Prior to Visit  Medication Sig Dispense Refill   albuterol (VENTOLIN HFA) 108 (90 Base) MCG/ACT inhaler Inhale into the lungs.     Azelastine HCl 137 MCG/SPRAY SOLN Place into both nostrils.     benzonatate  (TESSALON) 200 MG capsule Take 200 mg by mouth 3 (three) times daily as needed.     DYMISTA 137-50 MCG/ACT SUSP      EPINEPHrine 0.3 mg/0.3 mL IJ SOAJ injection See admin instructions.     EPIPEN 2-PAK 0.3 MG/0.3ML SOAJ injection      fluticasone (FLONASE) 50 MCG/ACT nasal spray Place into both nostrils.     levocetirizine (XYZAL) 5 MG tablet Take 2.5 mg by mouth.      montelukast (SINGULAIR) 10 MG tablet      Multiple Vitamins-Minerals (MULTIVITAMIN GUMMIES ADULT PO) Take 2 each by mouth daily.     omeprazole (PRILOSEC) 20 MG capsule Take 1 capsule (20 mg total) by mouth daily. 30 capsule 11   spironolactone (ALDACTONE) 50 MG tablet TAKE 1 TABLET (50 MG TOTAL) BY MOUTH DAILY. 90 tablet 3   No current facility-administered medications on file prior to visit.    Allergies  Allergen Reactions   Dust Mite Extract Cough and Itching   Fish Allergy Hives    *CATFISH*   Other     artificial sweeteners   Shrimp Extract Allergy Skin Test Itching and Swelling   Sulfa Antibiotics Rash    Objective:  General: Alert and oriented x3 in no acute distress  Dermatology: No open lesions bilateral lower extremities, old surgical scar well-healed on the right medial foot and ankle.  No webspace macerations, no ecchymosis bilateral, all nails x 10 are well manicured.  Vascular: Dorsalis Pedis and Posterior Tibial pedal pulses palpable, Capillary Fill Time 3 seconds, edema noted to bilateral ankles left greater than  right.  Neurology: Gross sensation intact via light touch bilateral.   Musculoskeletal: Mild to moderate tenderness with palpation at posterior tibial tendon course of the left foot greater than right, there is also mild diffuse pain to the dorsal lateral right foot over the extensor tendons, ankle with full range of motion appears to be within normal limits however there is mild guarding due to pain with pain worsening on the left with inversion of the foot.  Asymptomatic early bunion on  left.  Pes planus foot type.   Xrays  Left and right foot   Impression: Hardware intact at the navicular on the right foot.  There is midtarsal breech supportive of pes planus deformity with increased ossification dorsally at the tarsometatarsal joint consistent with arthritis on the right greater than left foot there is posterior calcaneal spur noted, and mild prominence of the first metatarsal head consistent with early bunion deformity on the left.  Assessment and Plan: Problem List Items Addressed This Visit   None Visit Diagnoses     Tendonitis    -  Primary   Relevant Orders   DG Foot Complete Right   DG Foot Complete Left   Left foot pain       Right foot pain       Cramping of feet       Pes planus of both feet            -Complete examination performed -Xrays reviewed -Discussed treatement options for likely tendinitis likely related to overuse at work -Advised patient to resume using her Tri-Lock brace that she had for the right on the left -Prescribed prednisone Dosepak for patient to take followed by meloxicam -Prescribed Flexeril for patient to take at bedtime for spasming -Advised patient to continue with good supportive shoes.  Activities to tolerance work note provided to allow periods of rest no constant walking or standing more than 2-hour increments at a time -Patient to return to office as scheduled in 2 to 3 weeks or sooner if condition worsens.  Advised patient if pain is still present may benefit from MRI of the left foot and ankle and further immobilization which may affect her ability to work.  Landis Martins, DPM

## 2021-09-03 DIAGNOSIS — Z0289 Encounter for other administrative examinations: Secondary | ICD-10-CM

## 2021-09-10 ENCOUNTER — Other Ambulatory Visit: Payer: Self-pay

## 2021-09-10 ENCOUNTER — Ambulatory Visit (INDEPENDENT_AMBULATORY_CARE_PROVIDER_SITE_OTHER): Payer: BC Managed Care – PPO | Admitting: Family Medicine

## 2021-09-10 ENCOUNTER — Encounter (INDEPENDENT_AMBULATORY_CARE_PROVIDER_SITE_OTHER): Payer: Self-pay | Admitting: Family Medicine

## 2021-09-10 VITALS — BP 112/79 | HR 101 | Temp 98.1°F | Ht 67.0 in | Wt 254.0 lb

## 2021-09-10 DIAGNOSIS — R7303 Prediabetes: Secondary | ICD-10-CM | POA: Diagnosis not present

## 2021-09-10 DIAGNOSIS — E282 Polycystic ovarian syndrome: Secondary | ICD-10-CM | POA: Diagnosis not present

## 2021-09-10 DIAGNOSIS — Z6839 Body mass index (BMI) 39.0-39.9, adult: Secondary | ICD-10-CM

## 2021-09-10 DIAGNOSIS — I10 Essential (primary) hypertension: Secondary | ICD-10-CM | POA: Diagnosis not present

## 2021-09-10 DIAGNOSIS — E782 Mixed hyperlipidemia: Secondary | ICD-10-CM

## 2021-09-10 DIAGNOSIS — R0602 Shortness of breath: Secondary | ICD-10-CM | POA: Diagnosis not present

## 2021-09-10 DIAGNOSIS — Z1331 Encounter for screening for depression: Secondary | ICD-10-CM

## 2021-09-10 DIAGNOSIS — R5383 Other fatigue: Secondary | ICD-10-CM | POA: Diagnosis not present

## 2021-09-10 DIAGNOSIS — E669 Obesity, unspecified: Secondary | ICD-10-CM

## 2021-09-10 DIAGNOSIS — Z9189 Other specified personal risk factors, not elsewhere classified: Secondary | ICD-10-CM

## 2021-09-11 LAB — LIPID PANEL WITH LDL/HDL RATIO
Cholesterol, Total: 221 mg/dL — ABNORMAL HIGH (ref 100–199)
HDL: 49 mg/dL (ref >39)
LDL Chol Calc (NIH): 157 mg/dL — ABNORMAL HIGH (ref 0–99)
LDL/HDL Ratio: 3.2 ratio (ref 0.0–3.2)
Triglycerides: 87 mg/dL (ref 0–149)
VLDL Cholesterol Cal: 15 mg/dL (ref 5–40)

## 2021-09-11 LAB — CBC WITH DIFFERENTIAL/PLATELET
Basophils Absolute: 0 10*3/uL (ref 0.0–0.2)
Basos: 1 %
EOS (ABSOLUTE): 0.1 10*3/uL (ref 0.0–0.4)
Eos: 1 %
Hematocrit: 49.9 % — ABNORMAL HIGH (ref 34.0–46.6)
Hemoglobin: 15.8 g/dL (ref 11.1–15.9)
Immature Grans (Abs): 0 10*3/uL (ref 0.0–0.1)
Immature Granulocytes: 1 %
Lymphocytes Absolute: 1.7 10*3/uL (ref 0.7–3.1)
Lymphs: 29 %
MCH: 27.6 pg (ref 26.6–33.0)
MCHC: 31.7 g/dL (ref 31.5–35.7)
MCV: 87 fL (ref 79–97)
Monocytes Absolute: 0.6 10*3/uL (ref 0.1–0.9)
Monocytes: 10 %
Neutrophils Absolute: 3.3 10*3/uL (ref 1.4–7.0)
Neutrophils: 58 %
Platelets: 430 10*3/uL (ref 150–450)
RBC: 5.73 x10E6/uL — ABNORMAL HIGH (ref 3.77–5.28)
RDW: 13.4 % (ref 11.7–15.4)
WBC: 5.7 10*3/uL (ref 3.4–10.8)

## 2021-09-11 LAB — COMPREHENSIVE METABOLIC PANEL
ALT: 23 IU/L (ref 0–32)
AST: 16 IU/L (ref 0–40)
Albumin/Globulin Ratio: 1.4 (ref 1.2–2.2)
Albumin: 4.5 g/dL (ref 3.8–4.9)
Alkaline Phosphatase: 99 IU/L (ref 44–121)
BUN/Creatinine Ratio: 16 (ref 9–23)
BUN: 14 mg/dL (ref 6–24)
Bilirubin Total: 0.4 mg/dL (ref 0.0–1.2)
CO2: 26 mmol/L (ref 20–29)
Calcium: 9.8 mg/dL (ref 8.7–10.2)
Chloride: 100 mmol/L (ref 96–106)
Creatinine, Ser: 0.9 mg/dL (ref 0.57–1.00)
Globulin, Total: 3.2 g/dL (ref 1.5–4.5)
Glucose: 110 mg/dL — ABNORMAL HIGH (ref 70–99)
Potassium: 5.1 mmol/L (ref 3.5–5.2)
Sodium: 140 mmol/L (ref 134–144)
Total Protein: 7.7 g/dL (ref 6.0–8.5)
eGFR: 77 mL/min/{1.73_m2} (ref >59)

## 2021-09-11 LAB — HEMOGLOBIN A1C
Est. average glucose Bld gHb Est-mCnc: 143 mg/dL
Hgb A1c MFr Bld: 6.6 % — ABNORMAL HIGH (ref 4.8–5.6)

## 2021-09-11 LAB — T4, FREE: Free T4: 1.33 ng/dL (ref 0.82–1.77)

## 2021-09-11 LAB — TSH: TSH: 0.803 u[IU]/mL (ref 0.450–4.500)

## 2021-09-11 LAB — FOLATE: Folate: 7.2 ng/mL (ref >3.0)

## 2021-09-11 LAB — T3: T3, Total: 127 ng/dL (ref 71–180)

## 2021-09-11 LAB — INSULIN, RANDOM: INSULIN: 20.1 u[IU]/mL (ref 2.6–24.9)

## 2021-09-11 LAB — VITAMIN D 25 HYDROXY (VIT D DEFICIENCY, FRACTURES): Vit D, 25-Hydroxy: 21.7 ng/mL — ABNORMAL LOW (ref 30.0–100.0)

## 2021-09-11 LAB — VITAMIN B12: Vitamin B-12: 308 pg/mL (ref 232–1245)

## 2021-09-14 NOTE — Progress Notes (Signed)
Chief Complaint:   OBESITY Ashley Mercado (MR# [id number]) is a 51 y.o. female who presents for evaluation and treatment of obesity and related comorbidities. Current BMI is Body mass index is 39.78 kg/m. [name] has been struggling with her weight for many years and has been unsuccessful in either losing weight, maintaining weight loss, or reaching her healthy weight goal.  [name] was referred by Dr. Milinda Antis. She is lactose intolerant. 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 Lunch is provided by work daily. 10 am 1 chicken breast, 1 cup of veggies (satisfied). Snacks, cookies ([employer] cookies sleeve of thin mints). Dinner is 1 pork chop, 1 cup of greens, 1 cup of starch. Feels satisfied. Snack before bed 100 calorie cookies.  [name] is currently in the action stage of change and ready to dedicate time achieving and maintaining a healthier weight. [name] is interested in becoming our patient and working on intensive lifestyle modifications including (but not limited to) diet and exercise for weight loss.  [name]'s habits were reviewed today and are as follows: Her family eats meals together, she thinks her family will eat healthier with her, her desired weight loss is 89 lbs, she started gaining weight when she went into meopause, her heaviest weight ever was 270 pounds, she has significant food cravings issues, she snacks frequently in the evenings, she skips meals frequently, she is frequently drinking liquids with calories, she frequently makes poor food choices, and she struggles with emotional eating.  Depression Screen [name]'s Food and Mood (modified PHQ-9) score was 17.  Depression screen PHQ 2/9 09/10/2021  Decreased Interest 3  Down, Depressed, Hopeless 1  PHQ - 2 Score 4  Altered sleeping 3  Tired, decreased energy 3  Change in appetite 2  Feeling bad or failure about yourself  1  Trouble concentrating 2  Moving slowly or fidgety/restless 2  Suicidal thoughts 0  PHQ-9 Score 17   Difficult doing work/chores Very difficult   Subjective:   1. Other fatigue [name] admits to daytime somnolence and admits to waking up still tired. Patient has a history of symptoms of daytime fatigue, morning fatigue, and morning headache. [name] generally gets 7 hours of sleep per night, and states that she has nightime awakenings. Snoring is present. Apneic episodes are not present. Epworth Sleepiness Score is 12. EKG-sinus tachycardia.  2. SOBOE (shortness of breath on exertion) Joni Reining notes increasing shortness of breath with exercising and seems to be worsening over time with weight gain. She notes getting out of breath sooner with activity than she used to. This has not gotten worse recently. Synthia denies shortness of breath at rest or orthopnea.  3. Pre-diabetes Nitu was diagnosed 4 years ago, with fluctuating A1c. Her recent A1c was 6.1.  4. Mixed hyperlipidemia Ally's first elevation of LDL  was 13 years ago. She is not on medications.  5. Essential hypertension Michol's blood pressure is controlled today. She is on spironolactone. She was diagnosed 2 years ago. She denies chest pain, chest pressure, or headache.  6. PCOS (polycystic ovarian syndrome) Zipporah was diagnosed from an ultrasound and labs. She is currently going through menopause.  7. At risk for obstructive sleep apnea Brenae is at increased risk for sleep apnea due to obesity.   Assessment/Plan:   1. Other fatigue Sheindy does feel that her weight is causing her energy to be lower than it should be. Fatigue may be related to obesity, depression or many other causes. Labs will be ordered, and in the meanwhile,  [name] will focus on self care including making healthy food choices, increasing physical activity and focusing on stress reduction.  - EKG 12-Lead - Vitamin B12 - CBC with Differential/Platelet - Folate - T3 - T4, free - TSH - VITAMIN D 25 Hydroxy (Vit-D Deficiency, Fractures)  2. SOBOE  (shortness of breath on exertion) [name] does feel that she gets out of breath more easily that she used to when she exercises. [name]'s shortness of breath appears to be obesity related and exercise induced. She has agreed to work on weight loss and gradually increase exercise to treat her exercise induced shortness of breath. Will continue to monitor closely.  3. Pre-diabetes We will check labs today, and we will follow up at [name]'s next appointment.  - Hemoglobin A1c - Insulin, random  4. Mixed hyperlipidemia We will check labs today, and we will follow up at [name]'s next appointment. - Lipid Panel With LDL/HDL Ratio  5. Essential hypertension EKG was done today. We will check labs today, and Glennetta will continue her spironolactone as directed.   - Comprehensive metabolic panel  6. PCOS (polycystic ovarian syndrome) [name] will continue to follow up with her GYN.  7. Depression screening [name] had a positive depression screening. Depression is commonly associated with obesity and often results in emotional eating behaviors. We will monitor this closely and work on CBT to help improve the non-hunger eating patterns. Referral to Psychology may be required if no improvement is seen as she continues in our clinic.  8. At risk for obstructive sleep apnea [name] was given approximately 15 minutes of coronary artery disease prevention counseling today. She is 51 y.o. female and has risk factors for obstructive sleep apnea including obesity. We discussed intensive lifestyle modifications today with an emphasis on specific weight loss instructions and strategies.  9. Obesity with current BMI of 39.9 [name] is currently in the action stage of change and her goal is to continue with weight loss efforts. I recommend [name] begin the structured treatment plan as follows:  She has agreed to the Category 2 Plan + 100 calories.  Exercise goals: As is.   Behavioral modification strategies:  increasing lean protein intake, meal planning and cooking strategies, keeping healthy foods in the home, and planning for success.  She was informed of the importance of frequent follow-up visits to maximize her success with intensive lifestyle modifications for her multiple health conditions. She was informed we would discuss her lab results at her next visit unless there is a critical issue that needs to be addressed sooner. Anira agreed to keep her next visit at the agreed upon time to discuss these results.  Objective:   Blood pressure 112/79, pulse (!) 101, temperature 98.1 F (36.7 C), height 5\' 7"  (1.702 m), weight 254 lb (115.2 kg), last menstrual period 12/28/2011, SpO2 100 %. Body mass index is 39.78 kg/m.  EKG: Normal sinus rhythm, rate 106 BPM.  Indirect Calorimeter completed today shows a VO2 of 246 and a REE of 1699.  Her calculated basal metabolic rate is 1610 thus her basal metabolic rate is worse than expected.  General: Cooperative, alert, well developed, in no acute distress. HEENT: Conjunctivae and lids unremarkable. Cardiovascular: Regular rhythm.  Lungs: Normal work of breathing. Neurologic: No focal deficits.   Lab Results  Component Value Date   CREATININE 0.90 09/10/2021   BUN 14 09/10/2021   NA 140 09/10/2021   K 5.1 09/10/2021   CL 100 09/10/2021   CO2 26 09/10/2021   Lab  Results  Component Value Date   ALT 23 09/10/2021   AST 16 09/10/2021   ALKPHOS 99 09/10/2021   BILITOT 0.4 09/10/2021   Lab Results  Component Value Date   HGBA1C 6.6 (H) 09/10/2021   HGBA1C 6.1 02/02/2021   HGBA1C 5.6 01/09/2020   HGBA1C 6.1 04/13/2019   HGBA1C 5.9 04/07/2018   Lab Results  Component Value Date   INSULIN 20.1 09/10/2021   Lab Results  Component Value Date   TSH 0.803 09/10/2021   Lab Results  Component Value Date   CHOL 221 (H) 09/10/2021   HDL 49 09/10/2021   LDLCALC 157 (H) 09/10/2021   TRIG 87 09/10/2021   CHOLHDL 3 02/02/2021   Lab Results   Component Value Date   WBC 5.7 09/10/2021   HGB 15.8 09/10/2021   HCT 49.9 (H) 09/10/2021   MCV 87 09/10/2021   PLT 430 09/10/2021   No results found for: IRON, TIBC, FERRITIN  Attestation Statements:   Reviewed by clinician on day of visit: allergies, medications, problem list, medical history, surgical history, family history, social history, and previous encounter notes.   I, Burt Knack, am acting as transcriptionist for Reuben Likes, MD.  This is the patient's first visit at [facility]. The patient's NEW PATIENT PACKET was reviewed at length. Included in the packet: current and past health history, medications, allergies, ROS, gynecologic history (women only), surgical history, family history, social history, weight history, weight loss surgery history (for those that have had weight loss surgery), nutritional evaluation, mood and food questionnaire, PHQ9, Epworth questionnaire, sleep habits questionnaire, patient life and health improvement goals questionnaire. These will all be scanned into the patient's chart under media.   During the visit, I independently reviewed the patient's EKG, bioimpedance scale results, and indirect calorimeter results. I used this information to tailor a meal plan for the patient that will help her to lose weight and will improve her obesity-related conditions going forward. I performed a medically necessary appropriate examination and/or evaluation. I discussed the assessment and treatment plan with the patient. The patient was provided an opportunity to ask questions and all were answered. The patient agreed with the plan and demonstrated an understanding of the instructions. Labs were ordered at this visit and will be reviewed at the next visit unless more critical results need to be addressed immediately. Clinical information was updated and documented in the EMR.    A separate 15 minutes was spent on risk counseling (see above).   I have reviewed the above documentation for accuracy and completeness, and I agree with the above. - Reuben Likes, MD

## 2021-09-17 ENCOUNTER — Ambulatory Visit: Payer: BC Managed Care – PPO | Admitting: Sports Medicine

## 2021-09-17 ENCOUNTER — Encounter: Payer: Self-pay | Admitting: Sports Medicine

## 2021-09-17 ENCOUNTER — Other Ambulatory Visit: Payer: Self-pay

## 2021-09-17 DIAGNOSIS — M779 Enthesopathy, unspecified: Secondary | ICD-10-CM | POA: Diagnosis not present

## 2021-09-17 DIAGNOSIS — M2141 Flat foot [pes planus] (acquired), right foot: Secondary | ICD-10-CM

## 2021-09-17 DIAGNOSIS — M25572 Pain in left ankle and joints of left foot: Secondary | ICD-10-CM | POA: Diagnosis not present

## 2021-09-17 DIAGNOSIS — M2142 Flat foot [pes planus] (acquired), left foot: Secondary | ICD-10-CM

## 2021-09-17 NOTE — Progress Notes (Signed)
Subjective: ?Ashley Mercado is a 51 y.o. female patient who returns to office for evaluation of left foot/arch pain and ankle/foot swelling.  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  Patient does report that the oral anti-inflammatory medicine that she has seems to be helping.  Patient denies current pain on the right foot at this time.  No other pedal complaints noted. ? ?Patient Active Problem List  ? Diagnosis Date Noted  ? Uterine fibroid 07/01/2020  ? Post-menopausal bleeding 06/24/2020  ? Pelvic joint pain, left 06/24/2020  ? Colon cancer screening 04/17/2019  ? Prediabetes 04/06/2017  ? Carpal tunnel syndrome 11/10/2016  ? Allergic rhinitis 11/10/2016  ? Encounter for routine gynecological examination 03/10/2016  ? Screening for HIV (human immunodeficiency virus) 03/07/2015  ? Essential hypertension 10/28/2014  ? PCOS (polycystic ovarian syndrome) 10/28/2014  ? Stress reaction 03/01/2014  ? Other screening mammogram 02/18/2012  ? Morbid obesity ([place]) 10/20/2011  ? Routine general medical examination at a health care facility 02/06/2011  ? Depression 11/04/2010  ? HIDRADENITIS SUPPURATIVA 05/12/2010  ? 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 Elevated platelet count 11/30/2006  ? KELOID SCAR 11/29/2006  ? FOLLICULITIS 21/19/4174  ? ? ?Current Outpatient Medications on File Prior to Visit  ?Medication Sig Dispense Refill  ? albuterol (VENTOLIN HFA) 108 (90 Base) MCG/ACT inhaler Inhale into the lungs.    ? Azelastine HCl 137 MCG/SPRAY SOLN Place into both nostrils.    ? cyclobenzaprine (FLEXERIL) 10 MG tablet Take 1 tablet (10 mg total) by  mouth at bedtime. 30 tablet 0  ? EPINEPHrine 0.3 mg/0.3 mL IJ SOAJ injection See admin instructions.    ? fluticasone (FLONASE) 50 MCG/ACT nasal spray Place into both nostrils.    ? levocetirizine (XYZAL) 5 MG tablet Take 2.5 mg by mouth.     ? meloxicam (MOBIC) 15 MG tablet Take 1 tablet (15 mg total) by mouth daily. 30 tablet 0  ? montelukast (SINGULAIR) 10 MG tablet     ? Multiple Vitamins-Minerals (MULTIVITAMIN GUMMIES ADULT PO) Take 2 each by mouth daily.    ? omeprazole (PRILOSEC) 20 MG capsule Take 1 capsule (20 mg total) by mouth daily. 30 capsule 11  ? spironolactone (ALDACTONE) 50 MG tablet TAKE 1 TABLET (50 MG TOTAL) BY MOUTH DAILY. 90 tablet 3  ? ?No current facility-administered medications on file prior to visit.  ? ? ?Allergies  ?Allergen Reactions  ? Dust Mite Extract Cough and Itching  ? Fish Allergy Hives  ?  *CATFISH*  ? Other   ?  artificial sweeteners  ? Shrimp Extract Allergy Skin Test Itching and Swelling  ? Sulfa Antibiotics Rash  ? ? ?Objective:  ?General: Alert and oriented x3 in no acute distress ? ?Dermatology: No open lesions bilateral lower extremities, old surgical scar well-healed on the right medial foot and ankle.  No webspace macerations, no ecchymosis bilateral, all nails x 10 are well manicured. ? ?Vascular: Dorsalis Pedis and Posterior Tibial pedal pulses palpable, Capillary Fill Time 3 seconds, edema noted to bilateral ankles left greater than right. ? ?  Neurology: Gross sensation intact via light touch bilateral.  ? ?Musculoskeletal: Mild to moderate tenderness with palpation at posterior tibial tendon course of the left foot from the ankle extending into the arch with most pain at the arch. No current pain to palpation of the right foot and ankle.  Strength appears to be in normal limits however on inversion and eversion there is mild weakness on the left.  Asymptomatic early bunion on left.  Pes planus foot type. ? ? ?Assessment and Plan: ?Problem List Items Addressed This  Visit   ?None ?Visit Diagnoses   ? ? Tendonitis    -  Primary  ? Relevant Orders  ? MR ANKLE LEFT WO CONTRAST  ? Pain in joint involving left ankle and foot      ? Relevant Orders  ? MR ANKLE LEFT WO CONTRAST  ? Pes planus of both feet      ? ?  ?  ? ?-Complete examination performed ?-Previous x-rays are negative ?-Discussed treatement options for likely tendinitis likely related to overuse at work versus acute on chronic tear on the left ?-Patient could not find her previous Tri-Lock brace so I dispensed a new one today at no additional charge for patient to wear on left ?-Patient to continue with meloxicam anti-inflammatory once daily as previously directed ?-May use topical over-the-counter pain cream or rub and ice as needed for pain and inflammation ?-Rx MRI left ankle to evaluate for possible tear along the posterior tibial tendon ?-Patient to return to office after MRI or sooner if problems or issues arise. ? ?Landis Martins, DPM ?  ?

## 2021-09-24 ENCOUNTER — Ambulatory Visit (INDEPENDENT_AMBULATORY_CARE_PROVIDER_SITE_OTHER): Payer: BC Managed Care – PPO | Admitting: Family Medicine

## 2021-09-24 ENCOUNTER — Encounter (INDEPENDENT_AMBULATORY_CARE_PROVIDER_SITE_OTHER): Payer: Self-pay | Admitting: Family Medicine

## 2021-09-24 ENCOUNTER — Other Ambulatory Visit: Payer: Self-pay

## 2021-09-24 VITALS — BP 123/84 | HR 64 | Temp 98.6°F | Ht 67.0 in | Wt 254.0 lb

## 2021-09-24 DIAGNOSIS — E559 Vitamin D deficiency, unspecified: Secondary | ICD-10-CM | POA: Diagnosis not present

## 2021-09-24 DIAGNOSIS — E7849 Other hyperlipidemia: Secondary | ICD-10-CM

## 2021-09-24 DIAGNOSIS — D751 Secondary polycythemia: Secondary | ICD-10-CM

## 2021-09-24 DIAGNOSIS — Z6839 Body mass index (BMI) 39.0-39.9, adult: Secondary | ICD-10-CM

## 2021-09-24 DIAGNOSIS — E669 Obesity, unspecified: Secondary | ICD-10-CM

## 2021-09-24 DIAGNOSIS — E1165 Type 2 diabetes mellitus with hyperglycemia: Secondary | ICD-10-CM | POA: Diagnosis not present

## 2021-09-24 MED ORDER — VITAMIN D (ERGOCALCIFEROL) 1.25 MG (50000 UNIT) PO CAPS: 50000.0000 [IU] | ORAL_CAPSULE | ORAL | 0 refills | Status: DC

## 2021-09-24 MED ORDER — OZEMPIC (0.25 OR 0.5 MG/DOSE) 2 MG/1.5ML ~~LOC~~ SOPN: 0.2500 mg | PEN_INJECTOR | SUBCUTANEOUS | 0 refills | Status: DC

## 2021-09-25 ENCOUNTER — Encounter (INDEPENDENT_AMBULATORY_CARE_PROVIDER_SITE_OTHER): Payer: Self-pay | Admitting: Family Medicine

## 2021-09-25 NOTE — Telephone Encounter (Signed)
Please check PA, Thanks

## 2021-09-28 ENCOUNTER — Ambulatory Visit
Admission: RE | Admit: 2021-09-28 | Discharge: 2021-09-28 | Disposition: A | Discharge status: Home / Self Care | Payer: BC Managed Care – PPO | Source: Ambulatory Visit | Attending: Sports Medicine | Admitting: Sports Medicine

## 2021-09-28 DIAGNOSIS — M779 Enthesopathy, unspecified: Secondary | ICD-10-CM

## 2021-09-28 DIAGNOSIS — M25572 Pain in left ankle and joints of left foot: Secondary | ICD-10-CM

## 2021-09-28 NOTE — Progress Notes (Signed)
? ? ? ?Chief Complaint:  ? ?OBESITY ?Ashley Mercado is here to discuss her progress with her obesity treatment plan along with follow-up of her obesity related diagnoses. Ashley Mercado is on the Category 2 Plan + 100 calories and states she is following her eating plan approximately 90% of the time. Ashley Mercado states she is walking 10,000 steps 5 times per week.   ? ?Today's visit was #: 2 ?Starting weight: 254 lbs ?Starting date: 09/10/2021 ?Today's weight: 254 lbs ?Today's date: 09/24/2021 ?Total lbs lost to date: 0 ?Total lbs lost since last in-office visit: 0 ? ?Interim History: Ashley Mercado does not care for sandwiches, and she was able to eat yogurt and noticed she struggles eating all the vegetables. She doesn't like eating all the supper quantity in the evening. She did Bischoff cookies, crackers and cucumbers for snacks. She notes sugar cravings and uses yogurt to satisfy. She denies hunger. ? ?Subjective:  ? ?1. Other hyperlipidemia ?[name]'s LDL is 157, HDL 49, and triglycerides 87, with total cholesterol of 221. She is not on statin. Her 10 year ASCVD risk score is 6% (optimal 1.37%). mod intensity statin recommended. I discussed labs with the patient today. ? ?2. Vitamin D deficiency ?Ashley Mercado has a new diagnosis of Vitamin D deficiency. She is not on prescription Vit D. Her Vit D level is of 21.7, and she notes fatigue. I discussed labs with the patient today. ? ?3. Erythrocytosis ?Ashley Mercado has a new diagnosis of erythrocytosis. Her red blood cells are 5.73 and hematocrit 49.9. She denies smoking. I discussed labs with the patient today. ? ?4. Type 2 diabetes mellitus with hyperglycemia, without long-term current use of insulin ([place]) ?[name] has a new diagnosis of diabetes mellitus. Her A1c is 6.6 and insulin 20.1. She has increased consumption of carbohydrates since changing jobs to [employer]. We discussed metformin and Ozempic as options. I discussed labs with the patient today. ? ?Assessment/Plan:  ? ?1. Other hyperlipidemia ?We  will repeat labs in 3 months, then reassess. I discussed options with the patient today. ? ?2. Vitamin D deficiency ?[name] agreed to start prescription Vitamin D 50,000 IU weekly, with no refills. ? ?- Vitamin D, Ergocalciferol, (DRISDOL) 1.25 MG (50000 UNIT) CAPS capsule; Take 1 capsule (50,000 Units total) by mouth every 7 (seven) days.  Dispense: 4 capsule; Refill: 0 ? ?3. Erythrocytosis ?We will follow up on CBC in 3 months. If Kiylee's hematocrit is still elevated then we will send her for a sleep evaluation. ? ?4. Type 2 diabetes mellitus with hyperglycemia, without long-term current use of insulin ([place]) ?Ashley Mercado agreed to start Ozempic 0.25 mg SubQ weekly, with no refills.  ? ?- Semaglutide,0.25 or 0.'5MG'$ /DOS, (OZEMPIC, 0.25 OR 0.5 MG/DOSE,) 2 MG/1.5ML SOPN; Inject 0.25 mg into the skin once a week.  Dispense: 1.5 mL; Refill: 0 ? ?5. Obesity with current BMI of 39.9 ?Ashley Mercado is currently in the action stage of change. As such, her goal is to continue with weight loss efforts. She has agreed to the Category 2 Plan + 100 calories.  ? ?Exercise goals: As is. ? ?Behavioral modification strategies: increasing lean protein intake, meal planning and cooking strategies, keeping healthy foods in the home, and planning for success. ? ?Ashley Mercado has agreed to follow-up with our clinic in 2 to 3 weeks. She was informed of the importance of frequent follow-up visits to maximize her success with intensive lifestyle modifications for her multiple health conditions.  ? ?Objective:  ? ?Blood pressure 123/84, pulse 64, temperature 98.6 ?F (37 ?C), height  $'5\' 7"'Y$  (1.702 m), weight 254 lb (115.2 kg), last menstrual period 12/28/2011, SpO2 99 %. ?Body mass index is 39.78 kg/m?. ? ?General: Cooperative, alert, well developed, in no acute distress. ?HEENT: Conjunctivae and lids unremarkable. ?Cardiovascular: Regular rhythm.  ?Lungs: Normal work of breathing. ?Neurologic: No focal deficits.  ? ?Lab Results  ?Component Value Date  ?  CREATININE 0.90 09/10/2021  ? BUN 14 09/10/2021  ? NA 140 09/10/2021  ? K 5.1 09/10/2021  ? CL 100 09/10/2021  ? CO2 26 09/10/2021  ? ?Lab Results  ?Component Value Date  ? ALT 23 09/10/2021  ? AST 16 09/10/2021  ? ALKPHOS 99 09/10/2021  ? BILITOT 0.4 09/10/2021  ? ?Lab Results  ?Component Value Date  ? HGBA1C 6.6 (H) 09/10/2021  ? HGBA1C 6.1 02/02/2021  ? HGBA1C 5.6 01/09/2020  ? HGBA1C 6.1 04/13/2019  ? HGBA1C 5.9 04/07/2018  ? ?Lab Results  ?Component Value Date  ? INSULIN 20.1 09/10/2021  ? ?Lab Results  ?Component Value Date  ? TSH 0.803 09/10/2021  ? ?Lab Results  ?Component Value Date  ? CHOL 221 (H) 09/10/2021  ? HDL 49 09/10/2021  ? LDLCALC 157 (H) 09/10/2021  ? TRIG 87 09/10/2021  ? CHOLHDL 3 02/02/2021  ? ?Lab Results  ?Component Value Date  ? VD25OH 21.7 (L) 09/10/2021  ? ?Lab Results  ?Component Value Date  ? WBC 5.7 09/10/2021  ? HGB 15.8 09/10/2021  ? HCT 49.9 (H) 09/10/2021  ? MCV 87 09/10/2021  ? PLT 430 09/10/2021  ? ?No results found for: IRON, TIBC, FERRITIN ? ?Attestation Statements:  ? ?Reviewed by clinician on day of visit: allergies, medications, problem list, medical history, surgical history, family history, social history, and previous encounter notes. ? ? ?I, Trixie Dredge, am acting as transcriptionist for Coralie Common, MD. ? ?I have reviewed the above documentation for accuracy and completeness, and I agree with the above. Coralie Common, MD ? ? ?

## 2021-09-29 NOTE — Telephone Encounter (Signed)
Prior authorization started for Ozempic. Will notify patient and provider once response is received.  ?

## 2021-09-30 NOTE — Progress Notes (Signed)
Called patient and gave results and recommendations per Dr Cannon Kettle, verbalized understanding.

## 2021-10-19 ENCOUNTER — Ambulatory Visit (INDEPENDENT_AMBULATORY_CARE_PROVIDER_SITE_OTHER): Payer: BC Managed Care – PPO | Admitting: Bariatrics

## 2021-10-19 ENCOUNTER — Ambulatory Visit (INDEPENDENT_AMBULATORY_CARE_PROVIDER_SITE_OTHER): Payer: BC Managed Care – PPO | Admitting: Physician Assistant

## 2021-10-19 ENCOUNTER — Encounter (INDEPENDENT_AMBULATORY_CARE_PROVIDER_SITE_OTHER): Payer: Self-pay

## 2021-10-19 ENCOUNTER — Encounter (INDEPENDENT_AMBULATORY_CARE_PROVIDER_SITE_OTHER): Payer: Self-pay | Admitting: Bariatrics

## 2021-10-19 VITALS — BP 122/84 | HR 100 | Temp 98.1°F | Ht 67.0 in | Wt 250.0 lb

## 2021-10-19 DIAGNOSIS — Z6839 Body mass index (BMI) 39.0-39.9, adult: Secondary | ICD-10-CM | POA: Diagnosis not present

## 2021-10-19 DIAGNOSIS — Z7985 Long-term (current) use of injectable non-insulin antidiabetic drugs: Secondary | ICD-10-CM

## 2021-10-19 DIAGNOSIS — E1165 Type 2 diabetes mellitus with hyperglycemia: Secondary | ICD-10-CM

## 2021-10-19 DIAGNOSIS — E559 Vitamin D deficiency, unspecified: Secondary | ICD-10-CM | POA: Diagnosis not present

## 2021-10-19 DIAGNOSIS — E669 Obesity, unspecified: Secondary | ICD-10-CM | POA: Diagnosis not present

## 2021-10-19 MED ORDER — OZEMPIC (0.25 OR 0.5 MG/DOSE) 2 MG/1.5ML ~~LOC~~ SOPN: 0.5000 mg | PEN_INJECTOR | SUBCUTANEOUS | 0 refills | Status: DC

## 2021-10-29 ENCOUNTER — Encounter: Payer: Self-pay | Admitting: Sports Medicine

## 2021-10-29 ENCOUNTER — Ambulatory Visit: Payer: BC Managed Care – PPO | Admitting: Sports Medicine

## 2021-10-29 ENCOUNTER — Encounter (INDEPENDENT_AMBULATORY_CARE_PROVIDER_SITE_OTHER): Payer: Self-pay | Admitting: Bariatrics

## 2021-10-29 DIAGNOSIS — M779 Enthesopathy, unspecified: Secondary | ICD-10-CM

## 2021-10-29 DIAGNOSIS — M76829 Posterior tibial tendinitis, unspecified leg: Secondary | ICD-10-CM

## 2021-10-29 DIAGNOSIS — M2142 Flat foot [pes planus] (acquired), left foot: Secondary | ICD-10-CM

## 2021-10-29 DIAGNOSIS — M79672 Pain in left foot: Secondary | ICD-10-CM

## 2021-10-29 DIAGNOSIS — M25572 Pain in left ankle and joints of left foot: Secondary | ICD-10-CM

## 2021-10-29 DIAGNOSIS — M2141 Flat foot [pes planus] (acquired), right foot: Secondary | ICD-10-CM

## 2021-10-29 MED ORDER — DICLOFENAC SODIUM 75 MG PO TBEC: 75.0000 mg | DELAYED_RELEASE_TABLET | Freq: Two times a day (BID) | ORAL | 0 refills | Status: DC

## 2021-10-29 MED ORDER — TRIAMCINOLONE ACETONIDE 10 MG/ML IJ SUSP
10.0000 mg | Freq: Once | INTRAMUSCULAR | Status: AC
  Administered 2021-10-29: 10 mg

## 2021-10-29 NOTE — Progress Notes (Signed)
Subjective: ?Marilynn Ekstein is a 51 y.o. female patient who returns to office for evaluation of left foot/arch pain and ankle/foot swelling.  Patient reports pain is still there with swelling and gets fair ups where she can't walk. Took brace off and still hurts on the left.  No other pedal complaints noted. ? ?Reports that her blood sugars have elevated. Diabetic.  ? ?Patient Active Problem List  ? Diagnosis Date Noted  ? Uterine fibroid 07/01/2020  ? Post-menopausal bleeding 06/24/2020  ? Pelvic joint pain, left 06/24/2020  ? Colon cancer screening 04/17/2019  ? Prediabetes 04/06/2017  ? Carpal tunnel syndrome 11/10/2016  ? Allergic rhinitis 11/10/2016  ? Encounter for routine gynecological examination 03/10/2016  ? Screening for HIV (human immunodeficiency virus) 03/07/2015  ? Essential hypertension 10/28/2014  ? PCOS (polycystic ovarian syndrome) 10/28/2014  ? Stress reaction 03/01/2014  ? Other screening mammogram 02/18/2012  ? Morbid obesity ([place]) 10/20/2011  ? Routine general medical examination at a health care facility 02/06/2011  ? Depression 11/04/2010  ? HIDRADENITIS SUPPURATIVA 05/12/2010  ? 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 Elevated platelet count 11/30/2006  ? KELOID SCAR 11/29/2006  ? FOLLICULITIS 78/29/5621  ? ? ?Current Outpatient Medications on File Prior to Visit  ?Medication Sig Dispense Refill  ? albuterol (VENTOLIN HFA) 108 (90 Base) MCG/ACT inhaler Inhale into the lungs.    ? Azelastine HCl 137 MCG/SPRAY SOLN Place into both nostrils.    ? cyclobenzaprine (FLEXERIL) 10 MG tablet Take 1 tablet (10 mg total) by mouth at bedtime. 30 tablet 0  ? EPINEPHrine 0.3 mg/0.3 mL IJ SOAJ injection See admin instructions.    ? fluticasone (FLONASE) 50 MCG/ACT nasal spray Place into both nostrils.    ? levocetirizine (XYZAL) 5 MG tablet Take 2.5 mg by mouth.     ? meloxicam (MOBIC) 15 MG tablet Take 1 tablet (15 mg total) by mouth daily. 30 tablet 0  ? montelukast (SINGULAIR) 10 MG tablet     ? Multiple Vitamins-Minerals  (MULTIVITAMIN GUMMIES ADULT PO) Take 2 each by mouth daily.    ? omeprazole (PRILOSEC) 20 MG capsule Take 1 capsule (20 mg total) by mouth daily. 30 capsule 11  ? Semaglutide,0.25 or 0.'5MG'$ /DOS, (OZEMPIC, 0.25 OR 0.5 MG/DOSE,) 2 MG/1.5ML SOPN Inject 0.5 mg into the skin once a week. 1.5 mL 0  ? spironolactone (ALDACTONE) 50 MG tablet TAKE 1 TABLET (50 MG TOTAL) BY MOUTH DAILY. 90 tablet 3  ? Vitamin D, Ergocalciferol, (DRISDOL) 1.25 MG (50000 UNIT) CAPS capsule Take 1 capsule (50,000 Units total) by mouth every 7 (seven) days. 4 capsule 0  ? ?No current facility-administered medications on file prior to visit.  ? ? ?Allergies  ?Allergen Reactions  ? Dust Mite Extract Cough and Itching  ? Fish Allergy Hives  ?  *CATFISH*  ? Other   ?  artificial sweeteners  ? Shrimp Extract Allergy Skin Test Itching and Swelling  ? Sulfa Antibiotics Rash  ? ? ?Objective:  ?General: Alert and oriented x3 in no acute distress ? ?Dermatology: No open lesions bilateral lower extremities, old surgical scar well-healed on the right medial foot and ankle.  No webspace macerations, no ecchymosis bilateral, all nails x 10 are well manicured. ? ?Vascular: Dorsalis Pedis and Posterior Tibial pedal pulses palpable, Capillary Fill Time 3 seconds, edema noted to bilateral ankles left greater than right. ? ?Neurology: Gross sensation intact via light touch bilateral.  ? ?Musculoskeletal: Mild to moderate tenderness with palpation at posterior tibial tendon course of the left foot from  the ankle extending into the arch with most pain at the arch. No current pain to palpation of the right foot and ankle.  Strength appears to be in normal limits however on inversion and eversion there is mild weakness on the left.  Asymptomatic early bunion on left.  Pes planus foot type. ? ? ?Assessment and Plan: ?Problem List Items Addressed This Visit   ?None ?Visit Diagnoses   ? ? Tendonitis    -  Primary  ? PTTD (posterior tibial tendon dysfunction)      ? Pain in  joint involving left ankle and foot      ? Left foot pain      ? Pes planus of both feet      ? ?  ? ? ? ? ?-Complete examination performed ?-Previous MRI reviewed ?-After oral consent and aseptic prep, injected a mixture containing 1 ml of 2%  ?plain lidocaine, 1 ml 0.5% plain marcaine, 0.5 ml of kenalog 10 and 0.5 ml of dexamethasone phosphate along the PT tendon course without complication. Post-injection care discussed with patient.  ?-Rx Diclofenac ?-Recommend brace with good supportive shoe if continues to hurt then will need to start using CAM boot ?-Patient to return to office or call if no better after 2 weeks or sooner if problems or issues arise. ? ?Landis Martins, DPM ?  ?

## 2021-10-29 NOTE — Progress Notes (Signed)
? ? ? ?Chief Complaint:  ? ?OBESITY ?Ashley Mercado is here to discuss her progress with her obesity treatment plan along with follow-up of her obesity related diagnoses. Ashley Mercado is on the Category 2 Plan and states she is following her eating plan approximately 80% of the time. Ashley Mercado states she is using the treadmill, walking, and weights for 30 minutes 3 times per week. ? ?Today's visit was #: 3 ?Starting weight: 254 lbs ?Starting date: 09/10/2021 ?Today's weight: 250 lbs ?Today's date: 10/19/2021 ?Total lbs lost to date: 4 lbs ?Total lbs lost since last in-office visit: 4 lbs ? ?Interim History: Ashley Mercado is down 4 lbs since her last visit. She is doing well with her water and protein.  ? ?Subjective:  ? ?1. Vitamin D deficiency ?Ashley Mercado is taking Vitamin D. She get minimal sun exposure.  ? ?2. Type 2 diabetes mellitus with hyperglycemia, without long-term current use of insulin ([place]) ?Ashley Mercado is taking Ozempic currently. She notes no side effects.  ? ?Assessment/Plan:  ? ?1. Vitamin D deficiency ?Low Vitamin D level contributes to fatigue and are associated with obesity, breast, and colon cancer. We will refill prescription Vitamin D 50,000 IU every week for 1 month with no refills and Ashley Mercado will follow-up for routine testing of Vitamin D, at least 2-3 times per year to avoid over-replacement. ? ?2. Type 2 diabetes mellitus with hyperglycemia, without long-term current use of insulin ([place]) ?We will refill Ozempic 0.5 mg for 1 month with no refills. Good blood sugar control is important to decrease the likelihood of diabetic complications such as nephropathy, neuropathy, limb loss, blindness, coronary artery disease, and death. Intensive lifestyle modification including diet, exercise and weight loss are the first line of treatment for diabetes.  ? ?- Semaglutide,0.25 or 0.'5MG'$ /DOS, (OZEMPIC, 0.25 OR 0.5 MG/DOSE,) 2 MG/1.5ML SOPN; Inject 0.5 mg into the skin once a week.  Dispense: 1.5 mL; Refill: 0 ? ?3. Obesity with current  BMI of 39.3 ?Ashley Mercado is currently in the action stage of change. As such, her goal is to continue with weight loss efforts. She has agreed to the Category 2 Plan.  ? ?Ashley Mercado will continue meal planning and she will continue intentional eating.  ? ?Exercise goals:  Ashley Mercado notes light foot pain. She will decrease exercise.  ? ?Behavioral modification strategies: increasing lean protein intake, decreasing simple carbohydrates, increasing vegetables, increasing water intake, decreasing eating out, no skipping meals, meal planning and cooking strategies, keeping healthy foods in the home, and planning for success. ? ?Ashley Mercado has agreed to follow-up with our clinic in 3 weeks with nurse practitioner. She was informed of the importance of frequent follow-up visits to maximize her success with intensive lifestyle modifications for her multiple health conditions.  ? ?Objective:  ? ?Blood pressure 122/84, pulse 100, temperature 98.1 ?F (36.7 ?C), height '5\' 7"'$  (1.702 m), weight 250 lb (113.4 kg), last menstrual period 12/28/2011, SpO2 96 %. ?Body mass index is 39.16 kg/m?. ? ?General: Cooperative, alert, well developed, in no acute distress. ?HEENT: Conjunctivae and lids unremarkable. ?Cardiovascular: Regular rhythm.  ?Lungs: Normal work of breathing. ?Neurologic: No focal deficits.  ? ?Lab Results  ?Component Value Date  ? CREATININE 0.90 09/10/2021  ? BUN 14 09/10/2021  ? NA 140 09/10/2021  ? K 5.1 09/10/2021  ? CL 100 09/10/2021  ? CO2 26 09/10/2021  ? ?Lab Results  ?Component Value Date  ? ALT 23 09/10/2021  ? AST 16 09/10/2021  ? ALKPHOS 99 09/10/2021  ? BILITOT 0.4 09/10/2021  ? ?Lab  Results  ?Component Value Date  ? HGBA1C 6.6 (H) 09/10/2021  ? HGBA1C 6.1 02/02/2021  ? HGBA1C 5.6 01/09/2020  ? HGBA1C 6.1 04/13/2019  ? HGBA1C 5.9 04/07/2018  ? ?Lab Results  ?Component Value Date  ? INSULIN 20.1 09/10/2021  ? ?Lab Results  ?Component Value Date  ? TSH 0.803 09/10/2021  ? ?Lab Results  ?Component Value Date  ? CHOL 221 (H)  09/10/2021  ? HDL 49 09/10/2021  ? LDLCALC 157 (H) 09/10/2021  ? TRIG 87 09/10/2021  ? CHOLHDL 3 02/02/2021  ? ?Lab Results  ?Component Value Date  ? VD25OH 21.7 (L) 09/10/2021  ? ?Lab Results  ?Component Value Date  ? WBC 5.7 09/10/2021  ? HGB 15.8 09/10/2021  ? HCT 49.9 (H) 09/10/2021  ? MCV 87 09/10/2021  ? PLT 430 09/10/2021  ? ?No results found for: IRON, TIBC, FERRITIN ? ?Attestation Statements:  ? ?Reviewed by clinician on day of visit: allergies, medications, problem list, medical history, surgical history, family history, social history, and previous encounter notes. ? ?I, Lizbeth Bark, RMA, am acting as transcriptionist for [employer], DO. ? ?I have reviewed the above documentation for accuracy and completeness, and I agree with the above. Jearld Lesch, DO  ?

## 2021-11-12 ENCOUNTER — Encounter (INDEPENDENT_AMBULATORY_CARE_PROVIDER_SITE_OTHER): Payer: Self-pay | Admitting: Nurse Practitioner

## 2021-11-12 ENCOUNTER — Ambulatory Visit (INDEPENDENT_AMBULATORY_CARE_PROVIDER_SITE_OTHER): Payer: BC Managed Care – PPO | Admitting: Nurse Practitioner

## 2021-11-12 VITALS — BP 117/79 | HR 79 | Temp 97.6°F | Ht 67.0 in | Wt 250.0 lb

## 2021-11-12 DIAGNOSIS — E1165 Type 2 diabetes mellitus with hyperglycemia: Secondary | ICD-10-CM | POA: Diagnosis not present

## 2021-11-12 DIAGNOSIS — E559 Vitamin D deficiency, unspecified: Secondary | ICD-10-CM | POA: Diagnosis not present

## 2021-11-12 DIAGNOSIS — Z6839 Body mass index (BMI) 39.0-39.9, adult: Secondary | ICD-10-CM | POA: Diagnosis not present

## 2021-11-12 DIAGNOSIS — E669 Obesity, unspecified: Secondary | ICD-10-CM | POA: Diagnosis not present

## 2021-11-12 DIAGNOSIS — Z7985 Long-term (current) use of injectable non-insulin antidiabetic drugs: Secondary | ICD-10-CM

## 2021-11-12 MED ORDER — VITAMIN D (ERGOCALCIFEROL) 1.25 MG (50000 UNIT) PO CAPS: 50000.0000 [IU] | ORAL_CAPSULE | ORAL | 0 refills | Status: DC

## 2021-11-16 NOTE — Progress Notes (Signed)
? ? ? ?Chief Complaint:  ? ?OBESITY ?Ashley Mercado is here to discuss her progress with her obesity treatment plan along with follow-up of her obesity related diagnoses. Ashley Mercado is on the Category 2 Plan and states she is following her eating plan approximately 80% of the time. Ashley Mercado states she is walking the treadmill and doing core exercises 20 minutes 3 times per week. ? ?Today's visit was #: 4 ?Starting weight: 254 lbs ?Starting date: 09/10/2021 ?Today's weight: 250 lbs ?Today's date: 11/12/2021 ?Total lbs lost to date: 4 lbs ?Total lbs lost since last in-office visit: 0 ? ?Interim History: Ashley Mercado voices that Ashley Mercado was not the best month.  She has had multiple celebrations especially at work.  She works for [employer] and gets free food.  She walks at least 14,000 steps at work 5 days per week.  She is struggling with eating everything on her plan, she is also struggling with what to eat for her snacks.  She is trying to drink enough water. ? ?Subjective:  ? ?1. Type 2 diabetes mellitus with hyperglycemia, without long-term current use of insulin ([place]) ?[name]'s last A1c was 6.6.  She is taking Ozempic 0.5 mg, denies any side effects.  ? ?2. Vitamin D deficiency ?Ashley Mercado's last Vitamin D level was 21.7.  She is taking prescription Vitamin D 50,000 IU weekly.  Denies any nausea, vomiting, or muscle weakness.   ? ?Assessment/Plan:  ? ?1. Type 2 diabetes mellitus with hyperglycemia, without long-term current use of insulin ([place]) ?[name] has agreed to continue Ozempic 0.5 mg, side effects discussed.  ? ?Good blood sugar control is important to decrease the likelihood of diabetic complications such as nephropathy, neuropathy, limb loss, blindness, coronary artery disease, and death. Intensive lifestyle modification including diet, exercise and weight loss are the first line of treatment for diabetes.   ? ?2. Vitamin D deficiency ?Ashley Mercado has agreed to continue prescription Vitamin D, 50,000 IU weekly.  See below.  Side effects  discussed.  ?- Vitamin D, Ergocalciferol, (DRISDOL) 1.25 MG (50000 UNIT) CAPS capsule; Take 1 capsule (50,000 Units total) by mouth every 7 (seven) days.  Dispense: 4 capsule; Refill: 0 ? ?Low Vitamin D level contributes to fatigue and are associated with obesity, breast, and colon cancer. She agrees to continue to take prescription Vitamin D '@50'$ ,000 IU every week and will follow-up for routine testing of Vitamin D, at least 2-3 times per year to avoid over-replacement.  ? ?3. Obesity with current BMI of 39.2 ?[name] was given 100 and 200 calorie snack ideas, and protein substitutes handouts.  ? ?[name] is currently in the action stage of change. As such, her goal is to continue with weight loss efforts. She has agreed to the Category 2 Plan.  ? ?Exercise goals:  As is. ? ?Behavioral modification strategies: increasing lean protein intake, increasing water intake, dealing with family or coworker sabotage, and planning for success. ? ?[name] has agreed to follow-up with our clinic in 3 weeks. She was informed of the importance of frequent follow-up visits to maximize her success with intensive lifestyle modifications for her multiple health conditions.  ? ?Objective:  ? ?Blood pressure 117/79, pulse 79, temperature 97.6 ?F (36.4 ?C), height '5\' 7"'$  (1.702 m), weight 250 lb (113.4 kg), last menstrual period 12/28/2011, SpO2 99 %. ?Body mass index is 39.16 kg/m?. ? ?General: Cooperative, alert, well developed, in no acute distress. ?HEENT: Conjunctivae and lids unremarkable. ?Cardiovascular: Regular rhythm.  ?Lungs: Normal work of breathing. ?Neurologic: No focal deficits.  ? ?Lab  Results  ?Component Value Date  ? CREATININE 0.90 09/10/2021  ? BUN 14 09/10/2021  ? NA 140 09/10/2021  ? K 5.1 09/10/2021  ? CL 100 09/10/2021  ? CO2 26 09/10/2021  ? ?Lab Results  ?Component Value Date  ? ALT 23 09/10/2021  ? AST 16 09/10/2021  ? ALKPHOS 99 09/10/2021  ? BILITOT 0.4 09/10/2021  ? ?Lab Results  ?Component Value Date  ? HGBA1C  6.6 (H) 09/10/2021  ? HGBA1C 6.1 02/02/2021  ? HGBA1C 5.6 01/09/2020  ? HGBA1C 6.1 04/13/2019  ? HGBA1C 5.9 04/07/2018  ? ?Lab Results  ?Component Value Date  ? INSULIN 20.1 09/10/2021  ? ?Lab Results  ?Component Value Date  ? TSH 0.803 09/10/2021  ? ?Lab Results  ?Component Value Date  ? CHOL 221 (H) 09/10/2021  ? HDL 49 09/10/2021  ? LDLCALC 157 (H) 09/10/2021  ? TRIG 87 09/10/2021  ? CHOLHDL 3 02/02/2021  ? ?Lab Results  ?Component Value Date  ? VD25OH 21.7 (L) 09/10/2021  ? ?Lab Results  ?Component Value Date  ? WBC 5.7 09/10/2021  ? HGB 15.8 09/10/2021  ? HCT 49.9 (H) 09/10/2021  ? MCV 87 09/10/2021  ? PLT 430 09/10/2021  ? ?No results found for: IRON, TIBC, FERRITIN ? ?Attestation Statements:  ? ?Reviewed by clinician on day of visit: allergies, medications, problem list, medical history, surgical history, family history, social history, and previous encounter notes. ? ?I, Davy Pique, RMA, am acting as Location manager for [employer], FNP-C. ? ?I have reviewed the above documentation for accuracy and completeness, and I agree with the above. Everardo Pacific, FNP  ?

## 2021-12-03 ENCOUNTER — Encounter (INDEPENDENT_AMBULATORY_CARE_PROVIDER_SITE_OTHER): Payer: Self-pay | Admitting: Family Medicine

## 2021-12-03 ENCOUNTER — Ambulatory Visit (INDEPENDENT_AMBULATORY_CARE_PROVIDER_SITE_OTHER): Payer: BC Managed Care – PPO | Admitting: Family Medicine

## 2021-12-03 VITALS — BP 115/78 | HR 96 | Temp 97.8°F | Ht 67.0 in | Wt 250.0 lb

## 2021-12-03 DIAGNOSIS — Z7985 Long-term (current) use of injectable non-insulin antidiabetic drugs: Secondary | ICD-10-CM

## 2021-12-03 DIAGNOSIS — Z6839 Body mass index (BMI) 39.0-39.9, adult: Secondary | ICD-10-CM

## 2021-12-03 DIAGNOSIS — E669 Obesity, unspecified: Secondary | ICD-10-CM | POA: Diagnosis not present

## 2021-12-03 DIAGNOSIS — E1165 Type 2 diabetes mellitus with hyperglycemia: Secondary | ICD-10-CM

## 2021-12-03 DIAGNOSIS — E559 Vitamin D deficiency, unspecified: Secondary | ICD-10-CM | POA: Diagnosis not present

## 2021-12-03 DIAGNOSIS — Z9189 Other specified personal risk factors, not elsewhere classified: Secondary | ICD-10-CM

## 2021-12-03 MED ORDER — OZEMPIC (0.25 OR 0.5 MG/DOSE) 2 MG/1.5ML ~~LOC~~ SOPN: 0.5000 mg | PEN_INJECTOR | SUBCUTANEOUS | 0 refills | Status: DC

## 2021-12-03 MED ORDER — VITAMIN D (ERGOCALCIFEROL) 1.25 MG (50000 UNIT) PO CAPS: 50000.0000 [IU] | ORAL_CAPSULE | ORAL | 0 refills | Status: DC

## 2021-12-09 NOTE — Progress Notes (Signed)
Chief Complaint:   OBESITY Ashley Mercado is here to discuss her progress with her obesity treatment plan along with follow-up of her obesity related diagnoses. Ashley Mercado is on the Category 2 Plan and states she is following her eating plan approximately 80% of the time. Ashley Mercado states she is working out 20 minutes 3-4 times per week.  Today's visit was #: 5 Starting weight: 254 lbs Starting date: 09/10/2021 Today's weight: 250 lbs Today's date: 12/03/2021 Total lbs lost to date: 4 lbs Total lbs lost since last in-office visit: 0  Interim History: Ashley Mercado is still going to Doctor for her foot and is figuring out if it's arthritis or something else. No issues with Ozempic. She realizes that she is not the best on her food plan, so trying to find alternatives to get herself more on plan. Often skipping breakfast, eating chicken, beans or salad for lunch. Supper is pork chops.   Subjective:   1. Type 2 diabetes mellitus with hyperglycemia, without long-term current use of insulin (HCC) Ashley Mercado is currently on Ozempic without GI side effects. Her last A1c 6.6.  2. Vitamin D deficiency Ashley Mercado is currently taking prescription Vit D 50,000 IU once a week. Denies any nausea, vomiting or muscle weakness. She notes fatigue. Her last Vit D level of 21.7.  3. At risk for activity intolerance Ashley Mercado is at risk of exercise intolerance due to tendonitis.  Assessment/Plan:   1. Type 2 diabetes mellitus with hyperglycemia, without long-term current use of insulin (HCC) We refill Ozempic 0.5 mg subcutaneous once weekly for 1 month with 0 refills.  -Refill Semaglutide,0.25 or 0.'5MG'$ /DOS, (OZEMPIC, 0.25 OR 0.5 MG/DOSE,) 2 MG/1.5ML SOPN; Inject 0.5 mg into the skin once a week.  Dispense: 1.5 mL; Refill: 0  2. Vitamin D deficiency We will refill Vit D 50,000 IU once a week for 1 month with 0 refills.  -Refill Vitamin D, Ergocalciferol, (DRISDOL) 1.25 MG (50000 UNIT) CAPS capsule; Take 1 capsule (50,000 Units  total) by mouth every 7 (seven) days.  Dispense: 4 capsule; Refill: 0  3. At risk for activity intolerance Ashley Mercado was given approximately 15 minutes of exercise intolerance counseling today. She is 51 y.o. female and has risk factors exercise intolerance including obesity. We discussed intensive lifestyle modifications today with an emphasis on specific weight loss instructions and strategies. Ashley Mercado will slowly increase activity as tolerated.  Repetitive spaced learning was employed today to elicit superior memory formation and behavioral change.  4. Obesity with current BMI of 39.2 Ashley Mercado is currently in the action stage of change. As such, her goal is to continue with weight loss efforts. She has agreed to the Category 2 Plan.   Exercise goals: All adults should avoid inactivity. Some physical activity is better than none, and adults who participate in any amount of physical activity gain some health benefits.  Behavioral modification strategies: increasing lean protein intake, meal planning and cooking strategies, and keeping healthy foods in the home.  Ashley Mercado has agreed to follow-up with our clinic in 3 weeks. She was informed of the importance of frequent follow-up visits to maximize her success with intensive lifestyle modifications for her multiple health conditions.   Objective:   Blood pressure 115/78, pulse 96, temperature 97.8 F (36.6 C), height '5\' 7"'$  (1.702 m), weight 250 lb (113.4 kg), last menstrual period 12/28/2011, SpO2 98 %. Body mass index is 39.16 kg/m.  General: Cooperative, alert, well developed, in no acute distress. HEENT: Conjunctivae and lids unremarkable. Cardiovascular: Regular rhythm.  Lungs: Normal work of breathing. Neurologic: No focal deficits.   Lab Results  Component Value Date   CREATININE 0.90 09/10/2021   BUN 14 09/10/2021   NA 140 09/10/2021   K 5.1 09/10/2021   CL 100 09/10/2021   CO2 26 09/10/2021   Lab Results  Component Value Date    ALT 23 09/10/2021   AST 16 09/10/2021   ALKPHOS 99 09/10/2021   BILITOT 0.4 09/10/2021   Lab Results  Component Value Date   HGBA1C 6.6 (H) 09/10/2021   HGBA1C 6.1 02/02/2021   HGBA1C 5.6 01/09/2020   HGBA1C 6.1 04/13/2019   HGBA1C 5.9 04/07/2018   Lab Results  Component Value Date   INSULIN 20.1 09/10/2021   Lab Results  Component Value Date   TSH 0.803 09/10/2021   Lab Results  Component Value Date   CHOL 221 (H) 09/10/2021   HDL 49 09/10/2021   LDLCALC 157 (H) 09/10/2021   TRIG 87 09/10/2021   CHOLHDL 3 02/02/2021   Lab Results  Component Value Date   VD25OH 21.7 (L) 09/10/2021   Lab Results  Component Value Date   WBC 5.7 09/10/2021   HGB 15.8 09/10/2021   HCT 49.9 (H) 09/10/2021   MCV 87 09/10/2021   PLT 430 09/10/2021   No results found for: IRON, TIBC, FERRITIN  Attestation Statements:   Reviewed by clinician on day of visit: allergies, medications, problem list, medical history, surgical history, family history, social history, and previous encounter notes.  I, Elnora Morrison, RMA am acting as transcriptionist for Coralie Common, MD.  I have reviewed the above documentation for accuracy and completeness, and I agree with the above. - Coralie Common, MD

## 2021-12-16 LAB — HM DIABETES EYE EXAM

## 2021-12-17 ENCOUNTER — Encounter (INDEPENDENT_AMBULATORY_CARE_PROVIDER_SITE_OTHER): Payer: Self-pay | Admitting: Nurse Practitioner

## 2021-12-17 ENCOUNTER — Ambulatory Visit (INDEPENDENT_AMBULATORY_CARE_PROVIDER_SITE_OTHER): Payer: BC Managed Care – PPO | Admitting: Nurse Practitioner

## 2021-12-17 VITALS — BP 104/72 | HR 72 | Temp 98.0°F | Ht 67.0 in | Wt 250.0 lb

## 2021-12-17 DIAGNOSIS — Z6839 Body mass index (BMI) 39.0-39.9, adult: Secondary | ICD-10-CM

## 2021-12-17 DIAGNOSIS — E669 Obesity, unspecified: Secondary | ICD-10-CM

## 2021-12-17 DIAGNOSIS — Z7985 Long-term (current) use of injectable non-insulin antidiabetic drugs: Secondary | ICD-10-CM

## 2021-12-17 DIAGNOSIS — E1165 Type 2 diabetes mellitus with hyperglycemia: Secondary | ICD-10-CM | POA: Diagnosis not present

## 2021-12-17 DIAGNOSIS — E559 Vitamin D deficiency, unspecified: Secondary | ICD-10-CM

## 2021-12-17 MED ORDER — VITAMIN D (ERGOCALCIFEROL) 1.25 MG (50000 UNIT) PO CAPS: 50000.0000 [IU] | ORAL_CAPSULE | ORAL | 0 refills | Status: DC

## 2021-12-21 NOTE — Progress Notes (Signed)
Chief Complaint:   OBESITY Ashley Mercado is here to discuss her progress with her obesity treatment plan along with follow-up of her obesity related diagnoses. Ashley Mercado is on the Category 2 Plan and states she is following her eating plan approximately 70% of the time. Ashley Mercado states she is exercising 0 minutes 0 times per week.  Today's visit was #: 6 Starting weight: 254 lbs Starting date: 09/10/2021 Today's weight: 250 lbs Today's date: 12/17/2021 Total lbs lost to date: 4 lbs Total lbs lost since last in-office visit: 0  Interim History: Ashley Mercado is frustrated of not loosing weight. She has maintained her weight since last visit. Struggling with Cat 2 plan and feels that she has done better with lower carb plan. Her highest weight was 280 lbs. She had to stop exercising due to Rt foot surgery. She is also having some pain in left foot.  Subjective:   1. Type 2 diabetes mellitus with hyperglycemia, without long-term current use of insulin (HCC) [name] is currently taking Ozempic 0.5 mg. Denies any side effects. Her last A1c was 6.6. She is not checking fasting blood sugars regularly. She denies hypoglycemia.  2. Vitamin D deficiency Ashley Mercado's last Vit D level was 21.7. She is currently taking prescription Vit D 50,000 IU once a week. Denies any nausea, vomiting or muscle weakness.  Assessment/Plan:   1. Type 2 diabetes mellitus with hyperglycemia, without long-term current use of insulin (HCC) Avarie will continue taking Ozempic 0.5 mg and will monitor for hypoglycemia. Side effects discussed.   Good blood sugar control is important to decrease the likelihood of diabetic complications such as nephropathy, neuropathy, limb loss, blindness, coronary artery disease, and death. Intensive lifestyle modification including diet, exercise and weight loss are the first line of treatment for diabetes.    2. Vitamin D deficiency We will refill Vit D 50,000 IU once a week for 1 month with 0 refills.  Side effects discussed.  Low Vitamin D level contributes to fatigue and are associated with obesity, breast, and colon cancer. She agrees to continue to take prescription Vitamin D '@50'$ ,000 IU every week and will follow-up for routine testing of Vitamin D, at least 2-3 times per year to avoid over-replacement.   - Refill Vitamin D, Ergocalciferol, (DRISDOL) 1.25 MG (50000 UNIT) CAPS capsule; Take 1 capsule (50,000 Units total) by mouth every 7 (seven) days.  Dispense: 4 capsule; Refill: 0  3. Obesity with current BMI of 39.2 Ashley Mercado is currently in the action stage of change. As such, her goal is to continue with weight loss efforts. She has agreed to following a lower carbohydrate, vegetable and lean protein rich diet plan.   Exercise goals: Swimming.  Behavioral modification strategies: increasing lean protein intake, decreasing liquid calories, and planning for success.  Ashley Mercado has agreed to follow-up with our clinic in 3 weeks. She was informed of the importance of frequent follow-up visits to maximize her success with intensive lifestyle modifications for her multiple health conditions.   Objective:   Blood pressure 104/72, pulse 72, temperature 98 F (36.7 C), height '5\' 7"'$  (1.702 m), weight 250 lb (113.4 kg), last menstrual period 12/28/2011, SpO2 95 %. Body mass index is 39.16 kg/m.  General: Cooperative, alert, well developed, in no acute distress. HEENT: Conjunctivae and lids unremarkable. Cardiovascular: Regular rhythm.  Lungs: Normal work of breathing. Neurologic: No focal deficits.   Lab Results  Component Value Date   CREATININE 0.90 09/10/2021   BUN 14 09/10/2021   NA 140 09/10/2021  K 5.1 09/10/2021   CL 100 09/10/2021   CO2 26 09/10/2021   Lab Results  Component Value Date   ALT 23 09/10/2021   AST 16 09/10/2021   ALKPHOS 99 09/10/2021   BILITOT 0.4 09/10/2021   Lab Results  Component Value Date   HGBA1C 6.6 (H) 09/10/2021   HGBA1C 6.1 02/02/2021   HGBA1C  5.6 01/09/2020   HGBA1C 6.1 04/13/2019   HGBA1C 5.9 04/07/2018   Lab Results  Component Value Date   INSULIN 20.1 09/10/2021   Lab Results  Component Value Date   TSH 0.803 09/10/2021   Lab Results  Component Value Date   CHOL 221 (H) 09/10/2021   HDL 49 09/10/2021   LDLCALC 157 (H) 09/10/2021   TRIG 87 09/10/2021   CHOLHDL 3 02/02/2021   Lab Results  Component Value Date   VD25OH 21.7 (L) 09/10/2021   Lab Results  Component Value Date   WBC 5.7 09/10/2021   HGB 15.8 09/10/2021   HCT 49.9 (H) 09/10/2021   MCV 87 09/10/2021   PLT 430 09/10/2021   No results found for: "IRON", "TIBC", "FERRITIN"  Attestation Statements:   Reviewed by clinician on day of visit: allergies, medications, problem list, medical history, surgical history, family history, social history, and previous encounter notes.  I, Brendell Tyus, RMA, am acting as transcriptionist for Everardo Pacific, FNP.  I have reviewed the above documentation for accuracy and completeness, and I agree with the above. Everardo Pacific, FNP

## 2021-12-23 ENCOUNTER — Ambulatory Visit: Payer: BC Managed Care – PPO | Admitting: Podiatry

## 2021-12-23 ENCOUNTER — Encounter: Payer: Self-pay | Admitting: Podiatry

## 2021-12-23 DIAGNOSIS — M2142 Flat foot [pes planus] (acquired), left foot: Secondary | ICD-10-CM

## 2021-12-23 DIAGNOSIS — M76822 Posterior tibial tendinitis, left leg: Secondary | ICD-10-CM

## 2021-12-23 NOTE — Patient Instructions (Addendum)
[place] # [address], [place] [id number] [phone]  [place] for Voltaren gel at the pharmacy over the counter or online (also known as diclofenac 1% gel). Apply to the painful areas 3-4x daily with the supplied dosing card. Allow to dry for 10 minutes before going into socks/shoes

## 2021-12-24 ENCOUNTER — Encounter: Payer: Self-pay | Admitting: Podiatry

## 2021-12-25 MED ORDER — DICLOFENAC SODIUM 75 MG PO TBEC: 75.0000 mg | DELAYED_RELEASE_TABLET | Freq: Two times a day (BID) | ORAL | 0 refills | Status: DC

## 2021-12-31 ENCOUNTER — Ambulatory Visit (INDEPENDENT_AMBULATORY_CARE_PROVIDER_SITE_OTHER): Payer: BC Managed Care – PPO | Admitting: Nurse Practitioner

## 2021-12-31 ENCOUNTER — Encounter (INDEPENDENT_AMBULATORY_CARE_PROVIDER_SITE_OTHER): Payer: Self-pay | Admitting: Nurse Practitioner

## 2021-12-31 VITALS — BP 116/82 | HR 80 | Temp 97.7°F | Ht 67.0 in | Wt 247.0 lb

## 2021-12-31 DIAGNOSIS — E669 Obesity, unspecified: Secondary | ICD-10-CM

## 2021-12-31 DIAGNOSIS — E1165 Type 2 diabetes mellitus with hyperglycemia: Secondary | ICD-10-CM

## 2021-12-31 DIAGNOSIS — Z7985 Long-term (current) use of injectable non-insulin antidiabetic drugs: Secondary | ICD-10-CM | POA: Diagnosis not present

## 2021-12-31 DIAGNOSIS — Z6838 Body mass index (BMI) 38.0-38.9, adult: Secondary | ICD-10-CM

## 2021-12-31 MED ORDER — SEMAGLUTIDE (1 MG/DOSE) 4 MG/3ML ~~LOC~~ SOPN: 1.0000 mg | PEN_INJECTOR | SUBCUTANEOUS | 0 refills | Status: DC

## 2021-12-31 NOTE — Progress Notes (Signed)
Chief Complaint:   OBESITY Ashley Mercado is here to discuss her progress with her obesity treatment plan along with follow-up of her obesity related diagnoses. Ashley Mercado is on following a lower carbohydrate, vegetable and lean protein rich diet plan and states she is following her eating plan approximately 90% of the time. Ashley Mercado states she is exercise 0 minutes 0 times per week.  Today's visit was #: 7 Starting weight: 254 lbs Starting date: 09/10/2021 Today's weight: 247 lbs Today's date: 12/31/2021 Total lbs lost to date: 7 lbs Total lbs lost since last in-office visit: 3  Interim History: Ashley Mercado has done well with weight loss since her last visit. She is doing well with LCP. Says it's easy for her. Drinking water and seltzer water.  Subjective:   1. Type 2 diabetes mellitus with hyperglycemia, without long-term current use of insulin (HCC) Ashley Mercado's last A1c was 6.6. She is currently taking Ozempic 0.5 mg. Some hunger and sugar cravings. Denies hypoglycemia.  Assessment/Plan:   1. Type 2 diabetes mellitus with hyperglycemia, without long-term current use of insulin (HCC) Increase Ozempic to 1 mg SubQ once weekly. Side effects discussed.   Good blood sugar control is important to decrease the likelihood of diabetic complications such as nephropathy, neuropathy, limb loss, blindness, coronary artery disease, and death. Intensive lifestyle modification including diet, exercise and weight loss are the first line of treatment for diabetes.    -Increase to Semaglutide, 1 MG/DOSE, 4 MG/3ML SOPN; Inject 1 mg as directed once a week.  Dispense: 3 mL; Refill: 0  2. Obesity with current BMI of 38.7 Ashley Mercado is currently in the action stage of change. As such, her goal is to continue with weight loss efforts. She has agreed to following a lower carbohydrate, vegetable and lean protein rich diet plan.   Will check fasting labs at next visit.  Exercise goals: As is.  Behavioral modification  strategies: increasing water intake, no skipping meals, and planning for success.  Ashley Mercado has agreed to follow-up with our clinic in 2 weeks. She was informed of the importance of frequent follow-up visits to maximize her success with intensive lifestyle modifications for her multiple health conditions.   Objective:   Blood pressure 116/82, pulse 80, temperature 97.7 F (36.5 C), height '5\' 7"'$  (1.702 m), weight 247 lb (112 kg), last menstrual period 12/28/2011, SpO2 100 %. Body mass index is 38.69 kg/m.  General: Cooperative, alert, well developed, in no acute distress. HEENT: Conjunctivae and lids unremarkable. Cardiovascular: Regular rhythm.  Lungs: Normal work of breathing. Neurologic: No focal deficits.   Lab Results  Component Value Date   CREATININE 0.90 09/10/2021   BUN 14 09/10/2021   NA 140 09/10/2021   K 5.1 09/10/2021   CL 100 09/10/2021   CO2 26 09/10/2021   Lab Results  Component Value Date   ALT 23 09/10/2021   AST 16 09/10/2021   ALKPHOS 99 09/10/2021   BILITOT 0.4 09/10/2021   Lab Results  Component Value Date   HGBA1C 6.6 (H) 09/10/2021   HGBA1C 6.1 02/02/2021   HGBA1C 5.6 01/09/2020   HGBA1C 6.1 04/13/2019   HGBA1C 5.9 04/07/2018   Lab Results  Component Value Date   INSULIN 20.1 09/10/2021   Lab Results  Component Value Date   TSH 0.803 09/10/2021   Lab Results  Component Value Date   CHOL 221 (H) 09/10/2021   HDL 49 09/10/2021   LDLCALC 157 (H) 09/10/2021   TRIG 87 09/10/2021   CHOLHDL 3 02/02/2021  Lab Results  Component Value Date   VD25OH 21.7 (L) 09/10/2021   Lab Results  Component Value Date   WBC 5.7 09/10/2021   HGB 15.8 09/10/2021   HCT 49.9 (H) 09/10/2021   MCV 87 09/10/2021   PLT 430 09/10/2021   No results found for: "IRON", "TIBC", "FERRITIN"  Attestation Statements:   Reviewed by clinician on day of visit: allergies, medications, problem list, medical history, surgical history, family history, social history,  and previous encounter notes.  I, Brendell Tyus, RMA, am acting as transcriptionist for Mina Marble, NP.  I have reviewed the above documentation for accuracy and completeness, and I agree with the above. Everardo Pacific, FNP

## 2022-01-14 ENCOUNTER — Encounter (INDEPENDENT_AMBULATORY_CARE_PROVIDER_SITE_OTHER): Payer: Self-pay | Admitting: Nurse Practitioner

## 2022-01-14 ENCOUNTER — Ambulatory Visit (INDEPENDENT_AMBULATORY_CARE_PROVIDER_SITE_OTHER): Payer: BC Managed Care – PPO | Admitting: Nurse Practitioner

## 2022-01-14 VITALS — BP 121/85 | HR 81 | Temp 97.7°F | Ht 67.0 in | Wt 243.0 lb

## 2022-01-14 DIAGNOSIS — E538 Deficiency of other specified B group vitamins: Secondary | ICD-10-CM | POA: Diagnosis not present

## 2022-01-14 DIAGNOSIS — E559 Vitamin D deficiency, unspecified: Secondary | ICD-10-CM | POA: Diagnosis not present

## 2022-01-14 DIAGNOSIS — E669 Obesity, unspecified: Secondary | ICD-10-CM

## 2022-01-14 DIAGNOSIS — E1165 Type 2 diabetes mellitus with hyperglycemia: Secondary | ICD-10-CM | POA: Diagnosis not present

## 2022-01-14 DIAGNOSIS — E7849 Other hyperlipidemia: Secondary | ICD-10-CM | POA: Diagnosis not present

## 2022-01-14 DIAGNOSIS — Z7985 Long-term (current) use of injectable non-insulin antidiabetic drugs: Secondary | ICD-10-CM

## 2022-01-14 DIAGNOSIS — Z6838 Body mass index (BMI) 38.0-38.9, adult: Secondary | ICD-10-CM

## 2022-01-14 MED ORDER — VITAMIN D (ERGOCALCIFEROL) 1.25 MG (50000 UNIT) PO CAPS: 50000.0000 [IU] | ORAL_CAPSULE | ORAL | 0 refills | Status: DC

## 2022-01-14 MED ORDER — SEMAGLUTIDE (1 MG/DOSE) 4 MG/3ML ~~LOC~~ SOPN: 1.0000 mg | PEN_INJECTOR | SUBCUTANEOUS | 0 refills | Status: DC

## 2022-01-15 LAB — COMPREHENSIVE METABOLIC PANEL
ALT: 14 IU/L (ref 0–32)
AST: 12 IU/L (ref 0–40)
Albumin/Globulin Ratio: 1.4 (ref 1.2–2.2)
Albumin: 4.6 g/dL (ref 3.8–4.9)
Alkaline Phosphatase: 100 IU/L (ref 44–121)
BUN/Creatinine Ratio: 17 (ref 9–23)
BUN: 16 mg/dL (ref 6–24)
Bilirubin Total: 0.5 mg/dL (ref 0.0–1.2)
CO2: 24 mmol/L (ref 20–29)
Calcium: 10 mg/dL (ref 8.7–10.2)
Chloride: 98 mmol/L (ref 96–106)
Creatinine, Ser: 0.96 mg/dL (ref 0.57–1.00)
Globulin, Total: 3.4 g/dL (ref 1.5–4.5)
Glucose: 75 mg/dL (ref 70–99)
Potassium: 4.9 mmol/L (ref 3.5–5.2)
Sodium: 139 mmol/L (ref 134–144)
Total Protein: 8 g/dL (ref 6.0–8.5)
eGFR: 71 mL/min/{1.73_m2} (ref >59)

## 2022-01-15 LAB — LIPID PANEL WITH LDL/HDL RATIO
Cholesterol, Total: 199 mg/dL (ref 100–199)
HDL: 47 mg/dL (ref >39)
LDL Chol Calc (NIH): 137 mg/dL — ABNORMAL HIGH (ref 0–99)
LDL/HDL Ratio: 2.9 ratio (ref 0.0–3.2)
Triglycerides: 82 mg/dL (ref 0–149)
VLDL Cholesterol Cal: 15 mg/dL (ref 5–40)

## 2022-01-15 LAB — VITAMIN D 25 HYDROXY (VIT D DEFICIENCY, FRACTURES): Vit D, 25-Hydroxy: 61.4 ng/mL (ref 30.0–100.0)

## 2022-01-15 LAB — INSULIN, RANDOM: INSULIN: 8.4 u[IU]/mL (ref 2.6–24.9)

## 2022-01-15 LAB — VITAMIN B12: Vitamin B-12: 325 pg/mL (ref 232–1245)

## 2022-01-15 LAB — HEMOGLOBIN A1C
Est. average glucose Bld gHb Est-mCnc: 111 mg/dL
Hgb A1c MFr Bld: 5.5 % (ref 4.8–5.6)

## 2022-01-18 NOTE — Progress Notes (Signed)
Chief Complaint:   OBESITY Ashley Mercado is here to discuss her progress with her obesity treatment plan along with follow-up of her obesity related diagnoses. Ashley Mercado is on following a lower carbohydrate, vegetable and lean protein rich diet plan and states she is following her eating plan approximately 90% of the time. Ashley Mercado states she is exercising 0 minutes 0 times per week.  Today's visit was #: 8 Starting weight: 254 lbs Starting date: 09/10/2021 Today's weight: 243 lbs Today's date: 01/14/2022 Total lbs lost to date: 11 lbs Total lbs lost since last in-office visit: 4  Interim History: Ashley Mercado has done well with weight loss since her last visit. Doing well on LCP. Misses smoothies. She is leaving this week to go on vacation[place]. 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 Currently wearing a boot on her left foot.  Subjective:   1. Type 2 diabetes mellitus with hyperglycemia, without long-term current use of insulin (HCC) Ashley Mercado is taking Ozempic 1 mg. Denies any side effects. Her last A1c was 6.6. Ozempic has helped with polyphagia and cravings. Her last eye exam was in 7/23. She is not on statin, ACE or ARB. She denies hypoglycemia.  2. Vitamin D deficiency Ashley Mercado is currently taking prescription Vit D 50,000 IU once a week. Denies any nausea, vomiting or muscle weakness.  3. Other hyperlipidemia Ashley Mercado has never been on medications. No family history HLD. She was recently diagnosed with Type 2 diabetes.   4. Low vitamin B12 level Ashley Mercado took Vit B12 injections in the past. Occasional fatigue, takes multivitamin, no over the counter Vit B12.  Assessment/Plan:   1. Type 2 diabetes mellitus with hyperglycemia, without long-term current use of insulin (HCC) We will obtain labs today. We will refill Ozempic 1 mg SubQ times 1 week. Side effects discussed.   Good blood sugar control is important to decrease the likelihood of diabetic complications such as nephropathy, neuropathy,  limb loss, blindness, coronary artery disease, and death. Intensive lifestyle modification including diet, exercise and weight loss are the first line of treatment for diabetes.    -Refill Semaglutide, 1 MG/DOSE, 4 MG/3ML SOPN; Inject 1 mg as directed once a week.  Dispense: 3 mL; Refill: 0  - Comprehensive metabolic panel - Hemoglobin A1c - Insulin, random  2. Vitamin D deficiency We will obtain labs today. We will refill Vit D 50,000 IU once a week for 1 month with 0 refills. Side effects discussed.   Low Vitamin D level contributes to fatigue and are associated with obesity, breast, and colon cancer. She agrees to continue to take prescription Vitamin D '@50'$ ,000 IU every week and will follow-up for routine testing of Vitamin D, at least 2-3 times per year to avoid over-replacement.   -Refill Vitamin D, Ergocalciferol, (DRISDOL) 1.25 MG (50000 UNIT) CAPS capsule; Take 1 capsule (50,000 Units total) by mouth every 7 (seven) days.  Dispense: 4 capsule; Refill: 0  - Comprehensive metabolic panel - VITAMIN D 25 Hydroxy (Vit-D Deficiency, Fractures)  3. Other hyperlipidemia We will obtain labs today. Cardiovascular risk and specific lipid/LDL goals reviewed.  We discussed several lifestyle modifications today and Ashley Mercado will continue to work on diet, exercise and weight loss efforts. Orders and follow up as documented in patient record. Based upon lab results may need to start a statin at next visit.  Counseling Intensive lifestyle modifications are the first line treatment for this issue. Dietary changes: Increase soluble fiber. Decrease simple carbohydrates. Exercise changes: Moderate to vigorous-intensity aerobic activity 150 minutes  per week if tolerated. Lipid-lowering medications: see documented in medical record.   - Comprehensive metabolic panel - Lipid Panel With LDL/HDL Ratio  4. Low vitamin B12 level We will obtain labs today.  - Vitamin B12  5. Obesity with current BMI of  38.2 Ashley Mercado is currently in the action stage of change. As such, her goal is to continue with weight loss efforts. She has agreed to following a lower carbohydrate, vegetable and lean protein rich diet plan.   Exercise goals: All adults should avoid inactivity. Some physical activity is better than none, and adults who participate in any amount of physical activity gain some health benefits.  Behavioral modification strategies: increasing lean protein intake, increasing water intake, travel eating strategies, and planning for success.  Ashley Mercado has agreed to follow-up with our clinic in 3 weeks. She was informed of the importance of frequent follow-up visits to maximize her success with intensive lifestyle modifications for her multiple health conditions.   Ashley Mercado was informed we would discuss her lab results at her next visit unless there is a critical issue that needs to be addressed sooner. Ashley Mercado agreed to keep her next visit at the agreed upon time to discuss these results.  Objective:   Blood pressure 121/85, pulse 81, temperature 97.7 F (36.5 C), height '5\' 7"'$  (1.702 m), weight 243 lb (110.2 kg), last menstrual period 12/28/2011, SpO2 100 %. Body mass index is 38.06 kg/m.  General: Cooperative, alert, well developed, in no acute distress. HEENT: Conjunctivae and lids unremarkable. Cardiovascular: Regular rhythm.  Lungs: Normal work of breathing. Neurologic: No focal deficits.   Lab Results  Component Value Date   CREATININE 0.96 01/14/2022   BUN 16 01/14/2022   NA 139 01/14/2022   K 4.9 01/14/2022   CL 98 01/14/2022   CO2 24 01/14/2022   Lab Results  Component Value Date   ALT 14 01/14/2022   AST 12 01/14/2022   ALKPHOS 100 01/14/2022   BILITOT 0.5 01/14/2022   Lab Results  Component Value Date   HGBA1C 5.5 01/14/2022   HGBA1C 6.6 (H) 09/10/2021   HGBA1C 6.1 02/02/2021   HGBA1C 5.6 01/09/2020   HGBA1C 6.1 04/13/2019   Lab Results  Component Value Date   INSULIN  8.4 01/14/2022   INSULIN 20.1 09/10/2021   Lab Results  Component Value Date   TSH 0.803 09/10/2021   Lab Results  Component Value Date   CHOL 199 01/14/2022   HDL 47 01/14/2022   LDLCALC 137 (H) 01/14/2022   TRIG 82 01/14/2022   CHOLHDL 3 02/02/2021   Lab Results  Component Value Date   VD25OH 61.4 01/14/2022   VD25OH 21.7 (L) 09/10/2021   Lab Results  Component Value Date   WBC 5.7 09/10/2021   HGB 15.8 09/10/2021   HCT 49.9 (H) 09/10/2021   MCV 87 09/10/2021   PLT 430 09/10/2021   No results found for: "IRON", "TIBC", "FERRITIN"  Attestation Statements:   Reviewed by clinician on day of visit: allergies, medications, problem list, medical history, surgical history, family history, social history, and previous encounter notes.  I, Brendell Tyus, RMA, am acting as transcriptionist for Everardo Pacific, FNP.  I have reviewed the above documentation for accuracy and completeness, and I agree with the above. Everardo Pacific, FNP

## 2022-01-25 ENCOUNTER — Encounter: Payer: Self-pay | Admitting: Podiatry

## 2022-01-25 ENCOUNTER — Ambulatory Visit: Payer: BC Managed Care – PPO | Admitting: Podiatry

## 2022-01-25 DIAGNOSIS — M2142 Flat foot [pes planus] (acquired), left foot: Secondary | ICD-10-CM

## 2022-01-25 DIAGNOSIS — M76829 Posterior tibial tendinitis, unspecified leg: Secondary | ICD-10-CM | POA: Diagnosis not present

## 2022-01-25 DIAGNOSIS — M76822 Posterior tibial tendinitis, left leg: Secondary | ICD-10-CM

## 2022-01-25 NOTE — Progress Notes (Signed)
  Subjective:  Patient ID: Ashley Mercado, female    DOB: 24-Mar-1971,  MRN: [id number]  Ongoing left arch pain  51 y.o. female presents with the above complaint. History confirmed with patient.  This is been going on for some time.  She previously was under care of Dr. Cannon Kettle.  She is on her feet quite a bit on concrete floor she is a Environmental consultant at [employer].  Most the pain is on the inside of the arch.  She previously had surgery for the same issue on her right foot and has not had issues since then  Interval History: Overall has had some improvement, she did not start PT (they did not contact her). She is planning to return to work tomorrow. She did not bring her inserts. She is still in the boot  Objective:  Physical Exam: warm, good capillary refill, no trophic changes or ulcerative lesions, normal DP and PT pulses, normal sensory exam, and she has pes planus deformity with pain on palpation of the left PT tendon insertion there is palpable prominence of the navicular here, overall much less intense than previous.   Radiographs: Multiple views x-ray of the left foot: Mild to moderate pes planus deformity, there appears to be an os naviculare medial to the main body  MRI 09/28/2021 IMPRESSION: Mild posterior tibial tenosynovitis.   Mild attenuation of the anterior talofibular ligament may represent the patient's normal anatomy versus a remote partial-thickness tear.   Partial-thickness cartilage degenerative change with subchondral edema/cystic change within the far medial aspect of the talar dome.     Electronically Signed   By: Yvonne Kendall M.D.   On: 09/29/2021 11:50 Assessment:   1. Posterior tibial tendon dysfunction (PTTD) of left lower extremity   2. Acquired pes planus, left   3. PTTD (posterior tibial tendon dysfunction)       Plan:  Patient was evaluated and treated and all questions answered.  Discussed the etiology and treatment options  for posterior tibial tendinitis including stretching, formal physical therapy with an eccentric exercises therapy plan, supportive shoegears such as a running shoe or sneaker, bracing and immobilization, topical and oral medications.  We also discussed that I do not routinely perform injections in this area because of the risk of an increased damage or rupture of the tendon.  We also discussed the role of surgical treatment of this for patients who do not improve after exhausting non-surgical treatment options.  -Referral to [facility] was re-sent -Can transition from boot over next 6 weeks gradually. Use PT to guide this process -Can use diclofenac gel and p.o. as needed   Return in about 6 weeks (around 03/08/2022) for re-check posterior tibial tendinitis.

## 2022-02-04 ENCOUNTER — Ambulatory Visit (INDEPENDENT_AMBULATORY_CARE_PROVIDER_SITE_OTHER): Payer: BC Managed Care – PPO | Admitting: Nurse Practitioner

## 2022-02-04 ENCOUNTER — Encounter (INDEPENDENT_AMBULATORY_CARE_PROVIDER_SITE_OTHER): Payer: Self-pay | Admitting: Nurse Practitioner

## 2022-02-04 VITALS — BP 124/84 | HR 98 | Temp 98.1°F | Ht 67.0 in | Wt 247.0 lb

## 2022-02-04 DIAGNOSIS — E538 Deficiency of other specified B group vitamins: Secondary | ICD-10-CM | POA: Diagnosis not present

## 2022-02-04 DIAGNOSIS — E559 Vitamin D deficiency, unspecified: Secondary | ICD-10-CM | POA: Diagnosis not present

## 2022-02-04 DIAGNOSIS — E1165 Type 2 diabetes mellitus with hyperglycemia: Secondary | ICD-10-CM | POA: Diagnosis not present

## 2022-02-04 DIAGNOSIS — E7849 Other hyperlipidemia: Secondary | ICD-10-CM

## 2022-02-04 DIAGNOSIS — Z6838 Body mass index (BMI) 38.0-38.9, adult: Secondary | ICD-10-CM

## 2022-02-04 DIAGNOSIS — R7989 Other specified abnormal findings of blood chemistry: Secondary | ICD-10-CM

## 2022-02-04 DIAGNOSIS — Z7985 Long-term (current) use of injectable non-insulin antidiabetic drugs: Secondary | ICD-10-CM

## 2022-02-04 DIAGNOSIS — E66812 Obesity, class 2: Secondary | ICD-10-CM

## 2022-02-04 DIAGNOSIS — E669 Obesity, unspecified: Secondary | ICD-10-CM

## 2022-02-04 MED ORDER — SEMAGLUTIDE (1 MG/DOSE) 4 MG/3ML ~~LOC~~ SOPN: 1.0000 mg | PEN_INJECTOR | SUBCUTANEOUS | 0 refills | Status: DC

## 2022-02-04 MED ORDER — VITAMIN D (ERGOCALCIFEROL) 1.25 MG (50000 UNIT) PO CAPS: 50000.0000 [IU] | ORAL_CAPSULE | ORAL | 0 refills | Status: DC

## 2022-02-04 NOTE — Patient Instructions (Signed)
The 10-year ASCVD risk score (Arnett DK, et al., 2019) is: 10.5%   Values used to calculate the score:     Age: 51 years     Sex: Female     Is Non-Hispanic African American: Yes     Diabetic: Yes     Tobacco smoker: No     Systolic Blood Pressure: 606 mmHg     Is BP treated: Yes     HDL Cholesterol: 47 mg/dL     Total Cholesterol: 199 mg/dL

## 2022-02-08 NOTE — Progress Notes (Signed)
Chief Complaint:   OBESITY Ashley Mercado is here to discuss her progress with her obesity treatment plan along with follow-up of her obesity related diagnoses. Ashley Mercado is on following a lower carbohydrate, vegetable and lean protein rich diet plan and states she is following her eating plan approximately 20% of the time. Ashley Mercado states she is exercising 0 minutes 0 times per week.  Today's visit was #: 9 Starting weight: 254 lbs Starting date: 09/10/2021 Today's weight: 247 lbs Today's date: 02/04/2022 Total lbs lost to date: 7 lbs Total lbs lost since last in-office visit: 0  Interim History: Ashley Mercado recently returned from vacation =. Notes she did not eat well while on vacation. She plans to restart LCP. Drinking water but not enough. Denies sugary drinks.  Subjective:   1. Type 2 diabetes mellitus with hyperglycemia, without long-term current use of insulin (HCC) [name] is currently taking Ozempic 1 mg. Notes some nausea after her last injection. It resolved after a couple of hours. She was painting a room and felt better after resting. Fasting blood sugars are: 99-100. Denies hypoglycemia. Her last A1c was 5.5.  2. Low vitamin B12 level Ashley Mercado is not currently taking any over the counter B12. She reports fatigue. Her last Vit B12 was within normal limits but on the lower side.  3. Vitamin D deficiency Ashley Mercado's Vit D looked better. She is currently taking prescription Vit D 50,000 IU once a week. Denies any nausea, vomiting or muscle weakness.  4. Other hyperlipidemia Melena has never been on medication. Family history: Mother.  Assessment/Plan:   1. Type 2 diabetes mellitus with hyperglycemia, without long-term current use of insulin (HCC) We will refill Ozempic 1 mg SubQ once weekly for 1 month with 0 refills.  Good blood sugar control is important to decrease the likelihood of diabetic complications such as nephropathy, neuropathy, limb loss, blindness, coronary artery disease,  and death. Intensive lifestyle modification including diet, exercise and weight loss are the first line of treatment for diabetes.    -Refill Semaglutide, 1 MG/DOSE, 4 MG/3ML SOPN; Inject 1 mg as directed once a week.  Dispense: 3 mL; Refill: 0  2. Low vitamin B12 level Start Vit B12 over the counter. Side effects discussed.   3. Vitamin D deficiency We will refill Vit D 50,000 IU take once every two weeks.   -Refill Vitamin D, Ergocalciferol, (DRISDOL) 1.25 MG (50000 UNIT) CAPS capsule; Take 1 capsule (50,000 Units total) by mouth every 14 (fourteen) days.  Dispense: 2 capsule; Refill: 0  4. Other hyperlipidemia Ashley Mercado is seeing PCP on 03/05/22, to discuss treatment options. Cardiovascular risk and specific lipid/LDL goals reviewed.  We discussed several lifestyle modifications today and [name] will continue to work on diet, exercise and weight loss efforts. Orders and follow up as documented in patient record.   The 10-year ASCVD risk score (Arnett DK, et al., 2019) is: 10.5%   Values used to calculate the score:     Age: 30 years     Sex: Female     Is Non-Hispanic African American: Yes     Diabetic: Yes     Tobacco smoker: No     Systolic Blood Pressure: 160 mmHg     Is BP treated: Yes     HDL Cholesterol: 47 mg/dL     Total Cholesterol: 199 mg/dL  Counseling Intensive lifestyle modifications are the first line treatment for this issue. Dietary changes: Increase soluble fiber. Decrease simple carbohydrates. Exercise changes: Moderate to vigorous-intensity aerobic activity  150 minutes per week if tolerated. Lipid-lowering medications: see documented in medical record.  5. Obesity with current BMI of 38.7 Ashley Mercado is currently in the action stage of change. As such, her goal is to continue with weight loss efforts. She has agreed to following a lower carbohydrate, vegetable and lean protein rich diet plan.   Labs discussed during visit today.  Exercise goals: All adults should  avoid inactivity. Some physical activity is better than none, and adults who participate in any amount of physical activity gain some health benefits.  Behavioral modification strategies: increasing lean protein intake, increasing vegetables, and increasing water intake.  Ashley Mercado has agreed to follow-up with our clinic in 3 weeks. She was informed of the importance of frequent follow-up visits to maximize her success with intensive lifestyle modifications for her multiple health conditions.   Objective:   Blood pressure 124/84, pulse 98, temperature 98.1 F (36.7 C), height '5\' 7"'$  (1.702 m), weight 247 lb (112 kg), last menstrual period 12/28/2011, SpO2 98 %. Body mass index is 38.69 kg/m.  General: Cooperative, alert, well developed, in no acute distress. HEENT: Conjunctivae and lids unremarkable. Cardiovascular: Regular rhythm.  Lungs: Normal work of breathing. Neurologic: No focal deficits.   Lab Results  Component Value Date   CREATININE 0.96 01/14/2022   BUN 16 01/14/2022   NA 139 01/14/2022   K 4.9 01/14/2022   CL 98 01/14/2022   CO2 24 01/14/2022   Lab Results  Component Value Date   ALT 14 01/14/2022   AST 12 01/14/2022   ALKPHOS 100 01/14/2022   BILITOT 0.5 01/14/2022   Lab Results  Component Value Date   HGBA1C 5.5 01/14/2022   HGBA1C 6.6 (H) 09/10/2021   HGBA1C 6.1 02/02/2021   HGBA1C 5.6 01/09/2020   HGBA1C 6.1 04/13/2019   Lab Results  Component Value Date   INSULIN 8.4 01/14/2022   INSULIN 20.1 09/10/2021   Lab Results  Component Value Date   TSH 0.803 09/10/2021   Lab Results  Component Value Date   CHOL 199 01/14/2022   HDL 47 01/14/2022   LDLCALC 137 (H) 01/14/2022   TRIG 82 01/14/2022   CHOLHDL 3 02/02/2021   Lab Results  Component Value Date   VD25OH 61.4 01/14/2022   VD25OH 21.7 (L) 09/10/2021   Lab Results  Component Value Date   WBC 5.7 09/10/2021   HGB 15.8 09/10/2021   HCT 49.9 (H) 09/10/2021   MCV 87 09/10/2021   PLT 430  09/10/2021   No results found for: "IRON", "TIBC", "FERRITIN"  Attestation Statements:   Reviewed by clinician on day of visit: allergies, medications, problem list, medical history, surgical history, family history, social history, and previous encounter notes.  I, Brendell Tyus, RMA, am acting as transcriptionist for Everardo Pacific, FNP.  I have reviewed the above documentation for accuracy and completeness, and I agree with the above. Everardo Pacific, FNP

## 2022-02-10 ENCOUNTER — Encounter (INDEPENDENT_AMBULATORY_CARE_PROVIDER_SITE_OTHER): Payer: Self-pay

## 2022-02-26 ENCOUNTER — Other Ambulatory Visit (INDEPENDENT_AMBULATORY_CARE_PROVIDER_SITE_OTHER): Payer: BC Managed Care – PPO

## 2022-02-26 ENCOUNTER — Telehealth (INDEPENDENT_AMBULATORY_CARE_PROVIDER_SITE_OTHER): Payer: BC Managed Care – PPO | Admitting: Family Medicine

## 2022-02-26 DIAGNOSIS — I10 Essential (primary) hypertension: Secondary | ICD-10-CM | POA: Diagnosis not present

## 2022-02-26 DIAGNOSIS — R7303 Prediabetes: Secondary | ICD-10-CM

## 2022-02-26 LAB — CBC WITH DIFFERENTIAL/PLATELET
Basophils Absolute: 0 10*3/uL (ref 0.0–0.1)
Basophils Relative: 1.2 % (ref 0.0–3.0)
Eosinophils Absolute: 0 10*3/uL (ref 0.0–0.7)
Eosinophils Relative: 0.8 % (ref 0.0–5.0)
HCT: 44.1 % (ref 36.0–46.0)
Hemoglobin: 14.9 g/dL (ref 12.0–15.0)
Lymphocytes Relative: 32.7 % (ref 12.0–46.0)
Lymphs Abs: 1.3 10*3/uL (ref 0.7–4.0)
MCHC: 33.9 g/dL (ref 30.0–36.0)
MCV: 88.3 fl (ref 78.0–100.0)
Monocytes Absolute: 0.4 10*3/uL (ref 0.1–1.0)
Monocytes Relative: 10.8 % (ref 3.0–12.0)
Neutro Abs: 2.2 10*3/uL (ref 1.4–7.7)
Neutrophils Relative %: 54.5 % (ref 43.0–77.0)
Platelets: 411 10*3/uL — ABNORMAL HIGH (ref 150.0–400.0)
RBC: 4.99 Mil/uL (ref 3.87–5.11)
RDW: 13.8 % (ref 11.5–15.5)
WBC: 4 10*3/uL (ref 4.0–10.5)

## 2022-02-26 LAB — COMPREHENSIVE METABOLIC PANEL
ALT: 17 U/L (ref 0–35)
AST: 15 U/L (ref 0–37)
Albumin: 4.4 g/dL (ref 3.5–5.2)
Alkaline Phosphatase: 87 U/L (ref 39–117)
BUN: 13 mg/dL (ref 6–23)
CO2: 33 mEq/L — ABNORMAL HIGH (ref 19–32)
Calcium: 9.9 mg/dL (ref 8.4–10.5)
Chloride: 99 mEq/L (ref 96–112)
Creatinine, Ser: 0.92 mg/dL (ref 0.40–1.20)
GFR: 71.18 mL/min (ref >60.00)
Glucose, Bld: 86 mg/dL (ref 70–99)
Potassium: 4.6 mEq/L (ref 3.5–5.1)
Sodium: 139 mEq/L (ref 135–145)
Total Bilirubin: 0.7 mg/dL (ref 0.2–1.2)
Total Protein: 7.8 g/dL (ref 6.0–8.3)

## 2022-02-26 LAB — TSH: TSH: 1.1 u[IU]/mL (ref 0.35–5.50)

## 2022-02-26 LAB — LIPID PANEL
Cholesterol: 188 mg/dL (ref 0–200)
HDL: 47.2 mg/dL (ref >39.00)
LDL Cholesterol: 123 mg/dL — ABNORMAL HIGH (ref 0–99)
NonHDL: 140.67
Total CHOL/HDL Ratio: 4
Triglycerides: 87 mg/dL (ref 0.0–149.0)
VLDL: 17.4 mg/dL (ref 0.0–40.0)

## 2022-02-26 NOTE — Telephone Encounter (Signed)
-----   Message from Ellamae Sia sent at 02/26/2022  7:31 AM EDT ----- Regarding: lab orders for now Patient is scheduled for CPX labs, please order future labs, Thanks , Karna Christmas

## 2022-03-01 ENCOUNTER — Encounter (INDEPENDENT_AMBULATORY_CARE_PROVIDER_SITE_OTHER): Payer: Self-pay | Admitting: Nurse Practitioner

## 2022-03-01 ENCOUNTER — Ambulatory Visit (INDEPENDENT_AMBULATORY_CARE_PROVIDER_SITE_OTHER): Payer: BC Managed Care – PPO | Admitting: Nurse Practitioner

## 2022-03-01 VITALS — BP 122/85 | HR 98 | Temp 98.1°F | Ht 67.0 in | Wt 245.0 lb

## 2022-03-01 DIAGNOSIS — E669 Obesity, unspecified: Secondary | ICD-10-CM | POA: Diagnosis not present

## 2022-03-01 DIAGNOSIS — E782 Mixed hyperlipidemia: Secondary | ICD-10-CM | POA: Diagnosis not present

## 2022-03-01 DIAGNOSIS — E559 Vitamin D deficiency, unspecified: Secondary | ICD-10-CM | POA: Diagnosis not present

## 2022-03-01 DIAGNOSIS — E1169 Type 2 diabetes mellitus with other specified complication: Secondary | ICD-10-CM | POA: Diagnosis not present

## 2022-03-01 DIAGNOSIS — Z6838 Body mass index (BMI) 38.0-38.9, adult: Secondary | ICD-10-CM

## 2022-03-01 MED ORDER — VITAMIN D (ERGOCALCIFEROL) 1.25 MG (50000 UNIT) PO CAPS: 50000.0000 [IU] | ORAL_CAPSULE | ORAL | 0 refills | Status: DC

## 2022-03-01 NOTE — Patient Instructions (Signed)
The 10-year ASCVD risk score (Arnett DK, et al., 2019) is: 9.4%   Values used to calculate the score:     Age: 51 years     Sex: Female     Is Non-Hispanic African American: Yes     Diabetic: Yes     Tobacco smoker: No     Systolic Blood Pressure: 715 mmHg     Is BP treated: Yes     HDL Cholesterol: 47.2 mg/dL     Total Cholesterol: 188 mg/dL

## 2022-03-05 ENCOUNTER — Telehealth: Payer: Self-pay | Admitting: *Deleted

## 2022-03-05 ENCOUNTER — Ambulatory Visit (INDEPENDENT_AMBULATORY_CARE_PROVIDER_SITE_OTHER): Payer: BC Managed Care – PPO | Admitting: Family Medicine

## 2022-03-05 ENCOUNTER — Encounter: Payer: Self-pay | Admitting: Family Medicine

## 2022-03-05 VITALS — BP 118/72 | HR 101 | Temp 98.2°F | Ht 67.25 in | Wt 245.4 lb

## 2022-03-05 DIAGNOSIS — I10 Essential (primary) hypertension: Secondary | ICD-10-CM | POA: Diagnosis not present

## 2022-03-05 DIAGNOSIS — Z1211 Encounter for screening for malignant neoplasm of colon: Secondary | ICD-10-CM

## 2022-03-05 DIAGNOSIS — Z23 Encounter for immunization: Secondary | ICD-10-CM | POA: Diagnosis not present

## 2022-03-05 DIAGNOSIS — Z0000 Encounter for general adult medical examination without abnormal findings: Secondary | ICD-10-CM

## 2022-03-05 DIAGNOSIS — E559 Vitamin D deficiency, unspecified: Secondary | ICD-10-CM | POA: Insufficient documentation

## 2022-03-05 DIAGNOSIS — E538 Deficiency of other specified B group vitamins: Secondary | ICD-10-CM

## 2022-03-05 DIAGNOSIS — E119 Type 2 diabetes mellitus without complications: Secondary | ICD-10-CM

## 2022-03-05 DIAGNOSIS — E1169 Type 2 diabetes mellitus with other specified complication: Secondary | ICD-10-CM

## 2022-03-05 DIAGNOSIS — E785 Hyperlipidemia, unspecified: Secondary | ICD-10-CM

## 2022-03-05 DIAGNOSIS — E282 Polycystic ovarian syndrome: Secondary | ICD-10-CM | POA: Diagnosis not present

## 2022-03-05 DIAGNOSIS — R7989 Other specified abnormal findings of blood chemistry: Secondary | ICD-10-CM

## 2022-03-05 MED ORDER — ROSUVASTATIN CALCIUM 10 MG PO TABS: 10.0000 mg | ORAL_TABLET | Freq: Every day | ORAL | 3 refills | Status: DC

## 2022-03-05 NOTE — Patient Instructions (Addendum)
Flu shot today   Your colonoscopy will be due in November You should get a reminder the month before but let us know if you don't   Start generic crestor 10 mg daily in the evening  We will check cholesterol in 6-8 weeks If any side effects hold it and let us   Take care of yourself   Advance your exercise as you can

## 2022-03-05 NOTE — Telephone Encounter (Signed)
Pt thought she had it- would you call her and check in?  Perhaps she got it confused with another pharmacy Thanks

## 2022-03-05 NOTE — Progress Notes (Unsigned)
Subjective:    Patient ID: Ashley Mercado, female    DOB: 06/01/1971, 51 y.o.   MRN: [id number]  HPI Here for health maintenance exam and to review chronic medical problems    Wt Readings from Last 3 Encounters:  03/05/22 245 lb 6 oz (111.3 kg)  03/01/22 245 lb (111.1 kg)  02/04/22 247 lb (112 kg)   38.15 kg/m  Feeling ok  More tired lately    Immunization History  Administered Date(s) Administered   Influenza Whole 04/04/1998   Influenza, High Dose Seasonal PF 06/26/2020, 06/30/2021   Influenza,inj,Quad PF,6+ Mos 03/28/2014, 03/11/2015, 04/06/2017, 04/12/2018, 04/17/2019, 03/03/2021   Influenza-Unspecified 03/19/2016, 03/28/2020   Moderna Sars-Covid-2 Vaccination 07/12/2019, 08/09/2019, 05/01/2020, 10/05/2020, 12/27/2021   Td 07/05/1992, 12/05/2006   Tdap 04/06/2017   Health Maintenance Due  Topic Date Due   URINE MICROALBUMIN  Never done   Hepatitis C Screening  Never done   Zoster Vaccines- Shingrix (1 of 2) Never done   INFLUENZA VACCINE  02/02/2022   COVID-19 Vaccine (6 - Moderna series) 02/21/2022   PAP SMEAR-Modifier  04/16/2022    [facility] Lost 7 lb per last visit  Taking semaglutide injection  Doing well with it at 1 mg  Starting crestor for cholesterol now   She is eating low carb right now - then slowly introduces things back  Not too difficult   Exercise - she is finally able to do more walking since her foot healed  Still has some flares and is in PT    Also taking vitamin D Last level 61.4 (was low at 21 prior)  Lab Results  Component Value Date   VITAMINB12 325 01/14/2022     Shingrix:  Had her shingrix vaccine already  Flu shot - wants to get today   Mammogram 06/2021 Self breast exam: no lumps   Supplements for bone health - vit   Pap  04/2019 normal   Has not had hpv  History of uterine fibroid  No gyn right now No bleeding  No new partners   Colonoscopy 05/2019 with 3 y recall     HTN bp is stable today  No cp or palpitations or headaches or edema  No side effects to medicines  BP Readings from Last 3 Encounters:  03/05/22 118/72  03/01/22 122/85  02/04/22 124/84     Spironolactone 50 mg daily   Lab Results  Component Value Date   CREATININE 0.92 02/26/2022   BUN 13 02/26/2022   NA 139 02/26/2022   K 4.6 02/26/2022   CL 99 02/26/2022   CO2 33 (H) 02/26/2022    H/o PCOS as well     Prediabetes Lab Results  Component Value Date   HGBA1C 5.5 01/14/2022  This is down from 6.1   She was dx with dm 2 at the weight clinic - a1c went up to 6.6 in march   Had her diabetic eye exam - no retinopathy   Hyperlipidemia Lab Results  Component Value Date   CHOL 188 02/26/2022   CHOL 199 01/14/2022   CHOL 221 (H) 09/10/2021   Lab Results  Component Value Date   HDL 47.20 02/26/2022   HDL 47 01/14/2022   HDL 49 09/10/2021   Lab Results  Component Value Date   LDLCALC 123 (H) 02/26/2022   LDLCALC 137 (H) 01/14/2022   LDLCALC 157 (H) 09/10/2021   Lab Results  Component Value Date   TRIG 87.0 02/26/2022  TRIG 82 01/14/2022   TRIG 87 09/10/2021   Lab Results  Component Value Date   CHOLHDL 4 02/26/2022   CHOLHDL 3 02/02/2021   CHOLHDL 4 01/09/2020   No results found for: "LDLDIRECT"  Not on a statin  Is interested    Lab Results  Component Value Date   ALT 17 02/26/2022   AST 15 02/26/2022   ALKPHOS 87 02/26/2022   BILITOT 0.7 02/26/2022   Lab Results  Component Value Date   WBC 4.0 02/26/2022   HGB 14.9 02/26/2022   HCT 44.1 02/26/2022   MCV 88.3 02/26/2022   PLT 411.0 (H) 02/26/2022   Lab Results  Component Value Date   TSH 1.10 02/26/2022     Patient Active Problem List   Diagnosis Date Noted   Vitamin D deficiency 03/05/2022   Low serum vitamin B12 03/05/2022   Hyperlipidemia associated with type 2 diabetes mellitus ([place]) 03/05/2022   Uterine fibroid 07/01/2020   Post-menopausal bleeding 06/24/2020   Pelvic  joint pain, left 06/24/2020   Colon cancer screening 04/17/2019   DM (diabetes mellitus), type 2 ([place]) 04/06/2017   Carpal tunnel syndrome 11/10/2016   Allergic rhinitis 11/10/2016   Encounter for routine gynecological examination 03/10/2016   Screening for HIV (human immunodeficiency virus) 03/07/2015   Essential hypertension 10/28/2014   PCOS (polycystic ovarian syndrome) 10/28/2014   Stress reaction 03/01/2014   Other screening mammogram 02/18/2012   Morbid obesity ([place]) 10/20/2011   Routine general medical examination at a health care facility 02/06/2011   Depression 11/04/2010   HIDRADENITIS SUPPURATIVA 05/12/2010   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   Elevated platelet count 11/30/2006   KELOID SCAR 11/29/2006   Past Medical History:  Diagnosis Date   Allergies    Bilateral swelling of feet    Folliculitis    High blood pressure    High cholesterol    Hip pain    HSV infection    Hydradenitis    Hypertension    Keloid scar    Lactose intolerance    Menorrhagia    Multiple food allergies    Prediabetes    Tired    Past Surgical History:  Procedure Laterality Date   COLONOSCOPY WITH PROPOFOL N/A 06/04/2019   Procedure: COLONOSCOPY WITH PROPOFOL;  Surgeon: Jonathon Bellows, MD;  Location: [facility] ENDOSCOPY;  Service: Gastroenterology;  Laterality: N/A;   FOOT SURGERY Right    Ligment   Social History   Tobacco Use   Smoking status: Never   Smokeless tobacco: Never  Vaping Use   Vaping Use: Never used  Substance Use Topics   Alcohol use: Yes    Comment: Occasionally   Drug use: No   Family History  Problem Relation Age of Onset   Hypertension Mother    Diabetes Mother    Sleep apnea Mother    Obesity Mother    Hypertension Father    Diabetes Father    Heart disease Father    Kidney disease Father    Cancer Maternal Grandmother        ovarian   Cancer Maternal Uncle        lymphoma   Allergies  Allergen Reactions   Dust Mite Extract Cough and Itching   Fish Allergy  Hives    *CATFISH*   Other     artificial sweeteners   Shrimp Extract Allergy Skin Test Itching and Swelling   Sulfa Antibiotics Rash   Current Outpatient Medications on File Prior to Visit  Medication Sig Dispense Refill  albuterol (VENTOLIN HFA) 108 (90 Base) MCG/ACT inhaler Inhale into the lungs.     Azelastine HCl 137 MCG/SPRAY SOLN Place into both nostrils.     EPINEPHrine 0.3 mg/0.3 mL IJ SOAJ injection See admin instructions.     fluticasone (FLONASE) 50 MCG/ACT nasal spray Place into both nostrils.     levocetirizine (XYZAL) 5 MG tablet Take 2.5 mg by mouth.      montelukast (SINGULAIR) 10 MG tablet      Multiple Vitamins-Minerals (MULTIVITAMIN GUMMIES ADULT PO) Take 2 each by mouth daily.     Semaglutide, 1 MG/DOSE, 4 MG/3ML SOPN Inject 1 mg as directed once a week. 3 mL 0   spironolactone (ALDACTONE) 50 MG tablet TAKE 1 TABLET (50 MG TOTAL) BY MOUTH DAILY. 90 tablet 3   Vitamin D, Ergocalciferol, (DRISDOL) 1.25 MG (50000 UNIT) CAPS capsule Take 1 capsule (50,000 Units total) by mouth every 14 (fourteen) days. 2 capsule 0   No current facility-administered medications on file prior to visit.     Review of Systems  Constitutional:  Negative for activity change, appetite change, fatigue, fever and unexpected weight change.  HENT:  Negative for congestion, ear pain, rhinorrhea, sinus pressure and sore throat.   Eyes:  Negative for pain, redness and visual disturbance.  Respiratory:  Negative for cough, shortness of breath and wheezing.   Cardiovascular:  Negative for chest pain and palpitations.  Gastrointestinal:  Negative for abdominal pain, blood in stool, constipation and diarrhea.  Endocrine: Negative for polydipsia and polyuria.  Genitourinary:  Negative for dysuria, frequency and urgency.  Musculoskeletal:  Negative for arthralgias, back pain and myalgias.  Skin:  Negative for pallor and rash.  Allergic/Immunologic: Negative for environmental allergies.  Neurological:   Negative for dizziness, syncope and headaches.  Hematological:  Negative for adenopathy. Does not bruise/bleed easily.  Psychiatric/Behavioral:  Negative for decreased concentration and dysphoric mood. The patient is not nervous/anxious.        Objective:   Physical Exam Constitutional:      General: She is not in acute distress.    Appearance: Normal appearance. She is well-developed. She is obese. She is not ill-appearing or diaphoretic.  HENT:     Head: Normocephalic and atraumatic.     Right Ear: Tympanic membrane, ear canal and external ear normal.     Left Ear: Tympanic membrane, ear canal and external ear normal.     Nose: Nose normal. No congestion.     Mouth/Throat:     Mouth: Mucous membranes are moist.     Pharynx: Oropharynx is clear. No posterior oropharyngeal erythema.  Eyes:     General: No scleral icterus.    Extraocular Movements: Extraocular movements intact.     Conjunctiva/sclera: Conjunctivae normal.     Pupils: Pupils are equal, round, and reactive to light.  Neck:     Thyroid: No thyromegaly.     Vascular: No carotid bruit or JVD.  Cardiovascular:     Rate and Rhythm: Normal rate and regular rhythm.     Pulses: Normal pulses.     Heart sounds: Normal heart sounds.     No gallop.  Pulmonary:     Effort: Pulmonary effort is normal. No respiratory distress.     Breath sounds: Normal breath sounds. No wheezing.     Comments: Good air exch Chest:     Chest wall: No tenderness.  Abdominal:     General: Bowel sounds are normal. There is no distension or abdominal bruit.  Palpations: Abdomen is soft. There is no mass.     Tenderness: There is no abdominal tenderness.     Hernia: No hernia is present.  Genitourinary:    Comments: Breast exam: No mass, nodules, thickening, tenderness, bulging, retraction, inflamation, nipple discharge or skin changes noted.  No axillary or clavicular LA.     Musculoskeletal:        General: No tenderness. Normal range of  motion.     Cervical back: Normal range of motion and neck supple. No rigidity. No muscular tenderness.     Right lower leg: No edema.     Left lower leg: No edema.     Comments: No kyphosis   Lymphadenopathy:     Cervical: No cervical adenopathy.  Skin:    General: Skin is warm and dry.     Coloration: Skin is not pale.     Findings: No erythema or rash.  Neurological:     Mental Status: She is alert. Mental status is at baseline.     Cranial Nerves: No cranial nerve deficit.     Motor: No abnormal muscle tone.     Coordination: Coordination normal.     Gait: Gait normal.     Deep Tendon Reflexes: Reflexes are normal and symmetric. Reflexes normal.  Psychiatric:        Mood and Affect: Mood normal.        Cognition and Memory: Cognition and memory normal.           Assessment & Plan:   Problem List Items Addressed This Visit       Cardiovascular and Mediastinum   Essential hypertension    bp in fair control at this time  BP Readings from Last 1 Encounters:  03/05/22 118/72  No changes needed Most recent labs reviewed  Disc lifstyle change with low sodium diet and exercise  Plan to continue spironolactone 50 mg daily      Relevant Medications   rosuvastatin (CRESTOR) 10 MG tablet     Endocrine   DM (diabetes mellitus), type 2 ([place])    Lab Results  Component Value Date   HGBA1C 5.5 01/14/2022  Improved- was 6.6 in march at the [facility]  utd eye exam  Taking semaglutide 1 mg weekly and tolerating well so far        Relevant Medications   rosuvastatin (CRESTOR) 10 MG tablet   Hyperlipidemia associated with type 2 diabetes mellitus ([place])    Disc goals for lipids and reasons to control them Rev last labs with pt , LDL is not at goal for diabetes Rev low sat fat diet in detail  Px crestor 10 mg daily / rev poss side eff like muscle pain  If well tolerated will check lipids in 6-8 wk      Relevant Medications   rosuvastatin (CRESTOR) 10 MG  tablet   PCOS (polycystic ovarian syndrome)     Other   Colon cancer screening    Due for 3 y recall colonoscopy in November Pt is aware  She will call for referral if does not rec reminder from GI      Elevated platelet count    Continue to monitor  Mild/improved at 411       Low serum vitamin B12    Lab Results  Component Value Date   VITAMINB12 325 01/14/2022  Improved with oral suppl  Followed by [facility] clinic       Morbid obesity ([place])  Discussed how this problem influences overall health and the risks it imposes  Reviewed plan for weight loss with lower calorie diet (via better food choices and also portion control or program like weight watchers) and exercise building up to or more than 30 minutes 5 days per week including some aerobic activity   Going to the [facility] clinic  Doing well with semaglutide        Routine general medical examination at a health care facility - Primary    Reviewed health habits including diet and exercise and skin cancer prevention Reviewed appropriate screening tests for age  Also reviewed health mt list, fam hx and immunization status , as well as social and family history   See HPI Labs reviewed  Had shingrix vaccines-will send for report  Flu shot today  Mammogram utd 12/22 Pap utd 2020  Colonoscopy due in nov for 3 y recall  Taking ca and D for bone health       Relevant Orders   Flu Vaccine QUAD 6+ mos PF IM (Fluarix Quad PF) (Completed)   Vitamin D deficiency    Taking vitamin D from the [facility]  Level improved at 61.4  Vitamin D level is therapeutic with current supplementation Disc importance of this to bone and overall health       Other Visit Diagnoses     Need for influenza vaccination       Relevant Orders   Flu Vaccine QUAD 6+ mos PF IM (Fluarix Quad PF) (Completed)

## 2022-03-05 NOTE — Telephone Encounter (Signed)
PCP request that I get pt's shingrix info from pharmacy. Called [facility] and they advise me that they don't see any record of her getting the vaccine there, they did say sometimes the date will archive after 3-4 yrs and they will not be able to see it but they don't even see a visit where she got the vaccines so no dates could be given  FYI to PCP

## 2022-03-08 ENCOUNTER — Encounter (INDEPENDENT_AMBULATORY_CARE_PROVIDER_SITE_OTHER): Payer: Self-pay | Admitting: Nurse Practitioner

## 2022-03-08 DIAGNOSIS — E1169 Type 2 diabetes mellitus with other specified complication: Secondary | ICD-10-CM | POA: Insufficient documentation

## 2022-03-08 HISTORY — DX: Type 2 diabetes mellitus with other specified complication: E11.69

## 2022-03-08 NOTE — Assessment & Plan Note (Signed)
Lab Results  Component Value Date   VITAMINB12 325 01/14/2022   Improved with oral suppl  Followed by [facility]

## 2022-03-08 NOTE — Assessment & Plan Note (Signed)
Continue to monitor  Mild/improved at 411

## 2022-03-08 NOTE — Assessment & Plan Note (Signed)
Reviewed health habits including diet and exercise and skin cancer prevention Reviewed appropriate screening tests for age  Also reviewed health mt list, fam hx and immunization status , as well as social and family history   See HPI Labs reviewed  Had shingrix vaccines-will send for report  Flu shot today  Mammogram utd 12/22 Pap utd 2020  Colonoscopy due in nov for 3 y recall  Taking ca and D for bone health

## 2022-03-08 NOTE — Progress Notes (Signed)
Chief Complaint:   OBESITY Ashley Mercado is here to discuss her progress with her obesity treatment plan along with follow-up of her obesity related diagnoses. Ashley Mercado is on following a lower carbohydrate, vegetable and lean protein rich diet plan and states she is following her eating plan approximately 70% of the time. Ashley Mercado states she is not exercising.   Today's visit was #: 10 Starting weight: 254 lbs Starting date: 09/10/2021 Today's weight: 245 lbs Today's date: 03/01/2022 Total lbs lost to date: 9 lbs Total lbs lost since last in-office visit: 2 lbs  Interim History: started traveling more for work.  Following low carb plan but not 100%.  Has not been eating as much.  Does not want to eat meat because of the weather.  Wants to eat more smoothies and salads.  Trying to drink more water.  Was recently cleared by PT to start walking.  Going to PT 2 days per week.  Went to play golf this past weekend.  No upcoming celebrations or vacations.   Subjective:   1. Vitamin D deficiency She is currently taking prescription vitamin D 50,000 IU every 14 days. She denies nausea, vomiting or muscle weakness.  2. DM type 2 with diabetic mixed hyperlipidemia ([place]) She has never been on medications.  FH: mother   Assessment/Plan:   1. Vitamin D deficiency Refill - Vitamin D, Ergocalciferol, (DRISDOL) 1.25 MG (50000 UNIT) CAPS capsule; Take 1 capsule (50,000 Units total) by mouth every 14 (fourteen) days.  Dispense: 2 capsule; Refill: 0 Side effects discussed  Low Vitamin D level contributes to fatigue and are associated with obesity, breast, and colon cancer. She agrees to continue to take prescription Vitamin D '@50'$ ,000 IU every week and will follow-up for routine testing of Vitamin D, at least 2-3 times per year to avoid over-replacement.   2. DM type 2 with diabetic mixed hyperlipidemia ([place]) Seeing her PCP on Friday to discuss lab results.  Discussed ASCVD risk with patient.    The  10-year ASCVD risk score (Arnett DK, et al., 2019) is: 8.3%   Values used to calculate the score:     Age: 51 years     Sex: Female     Is Non-Hispanic African American: Yes     Diabetic: Yes     Tobacco smoker: No     Systolic Blood Pressure: 086 mmHg     Is BP treated: Yes     HDL Cholesterol: 47.2 mg/dL     Total Cholesterol: 188 mg/dL   3. Obesity with current BMI of 38.5 Ashley Mercado is currently in the action stage of change. As such, her goal is to continue with weight loss efforts. She has agreed to following a lower carbohydrate, vegetable and lean protein rich diet plan.   Exercise goals:  as is.  Behavioral modification strategies: increasing lean protein intake, increasing vegetables, and increasing water intake.  Ashley Mercado has agreed to follow-up with our clinic in 3 weeks. She was informed of the importance of frequent follow-up visits to maximize her success with intensive lifestyle modifications for her multiple health conditions.   Objective:   Blood pressure 122/85, pulse 98, temperature 98.1 F (36.7 C), height '5\' 7"'$  (1.702 m), weight 245 lb (111.1 kg), last menstrual period 12/28/2011, SpO2 99 %. Body mass index is 38.37 kg/m.  General: Cooperative, alert, well developed, in no acute distress. HEENT: Conjunctivae and lids unremarkable. Cardiovascular: Regular rhythm.  Lungs: Normal work of breathing. Neurologic: No focal deficits.  Lab Results  Component Value Date   CREATININE 0.92 02/26/2022   BUN 13 02/26/2022   NA 139 02/26/2022   K 4.6 02/26/2022   CL 99 02/26/2022   CO2 33 (H) 02/26/2022   Lab Results  Component Value Date   ALT 17 02/26/2022   AST 15 02/26/2022   ALKPHOS 87 02/26/2022   BILITOT 0.7 02/26/2022   Lab Results  Component Value Date   HGBA1C 5.5 01/14/2022   HGBA1C 6.6 (H) 09/10/2021   HGBA1C 6.1 02/02/2021   HGBA1C 5.6 01/09/2020   HGBA1C 6.1 04/13/2019   Lab Results  Component Value Date   INSULIN 8.4 01/14/2022   INSULIN  20.1 09/10/2021   Lab Results  Component Value Date   TSH 1.10 02/26/2022   Lab Results  Component Value Date   CHOL 188 02/26/2022   HDL 47.20 02/26/2022   LDLCALC 123 (H) 02/26/2022   TRIG 87.0 02/26/2022   CHOLHDL 4 02/26/2022   Lab Results  Component Value Date   VD25OH 61.4 01/14/2022   VD25OH 21.7 (L) 09/10/2021   Lab Results  Component Value Date   WBC 4.0 02/26/2022   HGB 14.9 02/26/2022   HCT 44.1 02/26/2022   MCV 88.3 02/26/2022   PLT 411.0 (H) 02/26/2022   No results found for: "IRON", "TIBC", "FERRITIN"   Attestation Statements:   Reviewed by clinician on day of visit: allergies, medications, problem list, medical history, surgical history, family history, social history, and previous encounter notes.  I, Davy Pique, RMA, am acting as transcriptionist for Everardo Pacific, FNP  I have reviewed the above documentation for accuracy and completeness, and I agree with the above. Everardo Pacific, FNP

## 2022-03-08 NOTE — Assessment & Plan Note (Signed)
bp in fair control at this time  BP Readings from Last 1 Encounters:  03/05/22 118/72   No changes needed Most recent labs reviewed  Disc lifstyle change with low sodium diet and exercise  Plan to continue spironolactone 50 mg daily

## 2022-03-08 NOTE — Assessment & Plan Note (Signed)
Lab Results  Component Value Date   HGBA1C 5.5 01/14/2022   Improved- was 6.6 in march at the [facility]  utd eye exam  Taking semaglutide 1 mg weekly and tolerating well so far

## 2022-03-08 NOTE — Assessment & Plan Note (Signed)
Disc goals for lipids and reasons to control them Rev last labs with pt , LDL is not at goal for diabetes Rev low sat fat diet in detail  Px crestor 10 mg daily / rev poss side eff like muscle pain  If well tolerated will check lipids in 6-8 wk

## 2022-03-08 NOTE — Assessment & Plan Note (Signed)
Taking vitamin D from the [facility]  Level improved at 61.4  Vitamin D level is therapeutic with current supplementation Disc importance of this to bone and overall health

## 2022-03-08 NOTE — Assessment & Plan Note (Signed)
Due for 3 y recall colonoscopy in November Pt is aware  She will call for referral if does not rec reminder from GI

## 2022-03-08 NOTE — Assessment & Plan Note (Signed)
Discussed how this problem influences overall health and the risks it imposes  Reviewed plan for weight loss with lower calorie diet (via better food choices and also portion control or program like weight watchers) and exercise building up to or more than 30 minutes 5 days per week including some aerobic activity   Going to the [facility] clinic  Doing well with semaglutide

## 2022-03-10 ENCOUNTER — Ambulatory Visit: Payer: BC Managed Care – PPO | Admitting: Podiatry

## 2022-03-10 DIAGNOSIS — M76822 Posterior tibial tendinitis, left leg: Secondary | ICD-10-CM

## 2022-03-10 MED ORDER — DICLOFENAC SODIUM 75 MG PO TBEC: 75.0000 mg | DELAYED_RELEASE_TABLET | Freq: Two times a day (BID) | ORAL | 1 refills | Status: AC

## 2022-03-11 NOTE — Telephone Encounter (Signed)
Sent mychart asking pt

## 2022-03-14 NOTE — Progress Notes (Unsigned)
  Subjective:  Patient ID: Ashley Mercado, female    DOB: 1970-07-17,  MRN: [id number]  Ongoing left arch pain  51 y.o. female presents with the above complaint. History confirmed with patient.  This is been going on for some time.  She previously was under care of Dr. Cannon Kettle.  She is on her feet quite a bit on concrete floor she is a Environmental consultant at [employer].  Most the pain is on the inside of the arch.  She previously had surgery for the same issue on her right foot and has not had issues since then  Interval History:  Gradual improvement.  Physical therapy is helping quite a bit  Objective:  Physical Exam: warm, good capillary refill, no trophic changes or ulcerative lesions, normal DP and PT pulses, normal sensory exam, and she has pes planus deformity with pain on palpation of the left PT tendon insertion there is palpable prominence of the navicular here, overall much less intense than previous.   Radiographs: Multiple views x-ray of the left foot: Mild to moderate pes planus deformity, there appears to be an os naviculare medial to the main body  MRI 09/28/2021 IMPRESSION: Mild posterior tibial tenosynovitis.   Mild attenuation of the anterior talofibular ligament may represent the patient's normal anatomy versus a remote partial-thickness tear.   Partial-thickness cartilage degenerative change with subchondral edema/cystic change within the far medial aspect of the talar dome.     Electronically Signed   By: Yvonne Kendall M.D.   On: 09/29/2021 11:50 Assessment:   1. Posterior tibial tendon dysfunction (PTTD) of left lower extremity       Plan:  Patient was evaluated and treated and all questions answered.  Discussed the etiology and treatment options for posterior tibial tendinitis including stretching, formal physical therapy with an eccentric exercises therapy plan, supportive shoegears such as a running shoe or sneaker, bracing and  immobilization, topical and oral medications.  We also discussed that I do not routinely perform injections in this area because of the risk of an increased damage or rupture of the tendon.  We also discussed the role of surgical treatment of this for patients who do not improve after exhausting non-surgical treatment options.  Gradually improving physical therapy is helping.  Recommend she continue this.  I refilled her Voltaren p.o. take twice daily as needed.  Discussed dry needling and DTM with physical therapy.  I inspected her orthotics today and had quite a good fit.  I did recommend that the top cover be refurbished to a Spenco and additional scaphoid pad to add additional questions on a more comfortable for her.  This was sent for her to be refurbished.   Return in about 8 weeks (around 05/05/2022) for follow up PT tendonitis .

## 2022-03-16 ENCOUNTER — Telehealth: Payer: Self-pay

## 2022-03-16 ENCOUNTER — Other Ambulatory Visit: Payer: Self-pay

## 2022-03-16 DIAGNOSIS — Z86010 Personal history of colonic polyps: Secondary | ICD-10-CM

## 2022-03-16 MED ORDER — NA SULFATE-K SULFATE-MG SULF 17.5-3.13-1.6 GM/177ML PO SOLN: 1.0000 | Freq: Once | ORAL | 0 refills | Status: AC

## 2022-03-16 NOTE — Telephone Encounter (Signed)
Gastroenterology Pre-Procedure Review  Request Date: 06/04/22 Requesting Physician: Dr. Vicente Males  PATIENT REVIEW QUESTIONS: The patient responded to the following health history questions as indicated:    1. Are you having any GI issues? no 2. Do you have a personal history of Polyps? yes (last colonoscopy performed by Dr. Vicente Males 11/30/202) 3. Do you have a family history of Colon Cancer or Polyps? no 4. Diabetes Mellitus? Yes patient has been advised to hold Semaglutide 7 days prior to colonoscopy 5. Joint replacements in the past 12 months?no 6. Major health problems in the past 3 months?no 7. Any artificial heart valves, MVP, or defibrillator?no    MEDICATIONS & ALLERGIES:    Patient reports the following regarding taking any anticoagulation/antiplatelet therapy:   Plavix, Coumadin, Eliquis, Xarelto, Lovenox, Pradaxa, Brilinta, or Effient? no Aspirin? no  Patient confirms/reports the following medications:  Current Outpatient Medications  Medication Sig Dispense Refill   albuterol (VENTOLIN HFA) 108 (90 Base) MCG/ACT inhaler Inhale into the lungs.     Azelastine HCl 137 MCG/SPRAY SOLN Place into both nostrils.     diclofenac (VOLTAREN) 75 MG EC tablet Take 1 tablet (75 mg total) by mouth 2 (two) times daily. 60 tablet 1   EPINEPHrine 0.3 mg/0.3 mL IJ SOAJ injection See admin instructions.     fluticasone (FLONASE) 50 MCG/ACT nasal spray Place into both nostrils.     levocetirizine (XYZAL) 5 MG tablet Take 2.5 mg by mouth.      montelukast (SINGULAIR) 10 MG tablet      Multiple Vitamins-Minerals (MULTIVITAMIN GUMMIES ADULT PO) Take 2 each by mouth daily.     rosuvastatin (CRESTOR) 10 MG tablet Take 1 tablet (10 mg total) by mouth daily. 90 tablet 3   Semaglutide, 1 MG/DOSE, 4 MG/3ML SOPN Inject 1 mg as directed once a week. 3 mL 0   spironolactone (ALDACTONE) 50 MG tablet TAKE 1 TABLET (50 MG TOTAL) BY MOUTH DAILY. 90 tablet 3   Vitamin D, Ergocalciferol, (DRISDOL) 1.25 MG (50000 UNIT)  CAPS capsule Take 1 capsule (50,000 Units total) by mouth every 14 (fourteen) days. 2 capsule 0   No current facility-administered medications for this visit.    Patient confirms/reports the following allergies:  Allergies  Allergen Reactions   Dust Mite Extract Cough and Itching   Fish Allergy Hives    *CATFISH*   Other     artificial sweeteners   Shrimp Extract Allergy Skin Test Itching and Swelling   Sulfa Antibiotics Rash    No orders of the defined types were placed in this encounter.   AUTHORIZATION INFORMATION Primary Insurance: 1D#: Group #:  Secondary Insurance: 1D#: Group #:  SCHEDULE INFORMATION: Date: 06/04/22 Time: Location: [facility]

## 2022-03-18 ENCOUNTER — Encounter: Payer: Self-pay | Admitting: Family Medicine

## 2022-03-22 ENCOUNTER — Encounter (INDEPENDENT_AMBULATORY_CARE_PROVIDER_SITE_OTHER): Payer: Self-pay | Admitting: Nurse Practitioner

## 2022-03-22 ENCOUNTER — Ambulatory Visit (INDEPENDENT_AMBULATORY_CARE_PROVIDER_SITE_OTHER): Payer: BC Managed Care – PPO | Admitting: Nurse Practitioner

## 2022-03-22 VITALS — BP 112/80 | HR 84 | Temp 98.0°F | Ht 67.0 in | Wt 246.0 lb

## 2022-03-22 DIAGNOSIS — Z6839 Body mass index (BMI) 39.0-39.9, adult: Secondary | ICD-10-CM

## 2022-03-22 DIAGNOSIS — E669 Obesity, unspecified: Secondary | ICD-10-CM

## 2022-03-22 DIAGNOSIS — E785 Hyperlipidemia, unspecified: Secondary | ICD-10-CM

## 2022-03-22 DIAGNOSIS — E1165 Type 2 diabetes mellitus with hyperglycemia: Secondary | ICD-10-CM

## 2022-03-22 DIAGNOSIS — E559 Vitamin D deficiency, unspecified: Secondary | ICD-10-CM

## 2022-03-22 DIAGNOSIS — Z6838 Body mass index (BMI) 38.0-38.9, adult: Secondary | ICD-10-CM

## 2022-03-22 DIAGNOSIS — E1169 Type 2 diabetes mellitus with other specified complication: Secondary | ICD-10-CM | POA: Diagnosis not present

## 2022-03-22 DIAGNOSIS — Z7985 Long-term (current) use of injectable non-insulin antidiabetic drugs: Secondary | ICD-10-CM

## 2022-03-22 MED ORDER — VITAMIN D (ERGOCALCIFEROL) 1.25 MG (50000 UNIT) PO CAPS: 50000.0000 [IU] | ORAL_CAPSULE | ORAL | 0 refills | Status: DC

## 2022-03-22 MED ORDER — SEMAGLUTIDE (1 MG/DOSE) 4 MG/3ML ~~LOC~~ SOPN: 1.0000 mg | PEN_INJECTOR | SUBCUTANEOUS | 0 refills | Status: DC

## 2022-03-24 NOTE — Progress Notes (Signed)
Chief Complaint:   OBESITY Ashley Mercado is here to discuss her progress with her obesity treatment plan along with follow-up of her obesity related diagnoses. Ashley Mercado is on following a lower carbohydrate, vegetable and lean protein rich diet plan and states she is following her eating plan approximately 0% of the time. Ashley Mercado states she is walking 20-30 minutes 6 times per week.  Today's visit was #: 11 Starting weight: 254 lbs Starting date: 09/10/2021 Today's weight: 246 lbs Today's date: 03/22/2022 Total lbs lost to date: 8 lbs Total lbs lost since last in-office visit: 0  Interim History: Ashley Mercado notes that it has been a tough couple of weeks due to traveling. No upcoming celebrations or vacation so plans to get back on track. Still going to physical therapy. Plans to cook more at home due to recently buying a new truck.  Subjective:   1. Type 2 diabetes mellitus with hyperglycemia, without long-term current use of insulin (HCC) Ashley Mercado is taking Ozempic 1 mg. Denies any side effects.Her last A1c looked better at 5.5. Last eye exam: 2023. She is taking a statin.  2. Hyperlipidemia associated with type 2 diabetes mellitus ([place]) Ashley Mercado is taking Crestor 10 mg. Denies any side effects.  3. Vitamin D deficiency Ashley Mercado is currently taking prescription Vit D 50,000 IU once every 2 weeks. Denies side effects.  Denies nausea, vomiting or muscle weakness.   Assessment/Plan:   1. Type 2 diabetes mellitus with hyperglycemia, without long-term current use of insulin (HCC) We will refill Ozempic 1 mg once weekly for 1 month with 0 refills  Good blood sugar control is important to decrease the likelihood of diabetic complications such as nephropathy, neuropathy, limb loss, blindness, coronary artery disease, and death. Intensive lifestyle modification including diet, exercise and weight loss are the first line of treatment for diabetes.    -Refill Semaglutide, 1 MG/DOSE, 4 MG/3ML SOPN; Inject 1  mg as directed once a week.  Dispense: 3 mL; Refill: 0  2. Hyperlipidemia associated with type 2 diabetes mellitus ([place]) Continue medications as directed and follow up with PCP.   3. Vitamin D deficiency We will refill Vit D 50,000 IU once every 2 weeks for 1 month with 0 refills.  Low Vitamin D level contributes to fatigue and are associated with obesity, breast, and colon cancer. She agrees to continue to take prescription Vitamin D '@50'$ ,000 IU every week and will follow-up for routine testing of Vitamin D, at least 2-3 times per year to avoid over-replacement.   -Refill Vitamin D, Ergocalciferol, (DRISDOL) 1.25 MG (50000 UNIT) CAPS capsule; Take 1 capsule (50,000 Units total) by mouth every 14 (fourteen) days.  Dispense: 2 capsule; Refill: 0  4. Obesity with current BMI of 38.6 Ashley Mercado is currently in the action stage of change. As such, her goal is to continue with weight loss efforts. She has agreed to following a lower carbohydrate, vegetable and lean protein rich diet plan.   Exercise goals: As is.  Behavioral modification strategies: increasing vegetables, increasing water intake, no skipping meals, and planning for success.  Ashley Mercado has agreed to follow-up with our clinic in 3 weeks. She was informed of the importance of frequent follow-up visits to maximize her success with intensive lifestyle modifications for her multiple health conditions.   Objective:   Blood pressure 112/80, pulse 84, temperature 98 F (36.7 C), height '5\' 7"'$  (1.702 m), weight 246 lb (111.6 kg), last menstrual period 12/28/2011, SpO2 100 %. Body mass index is 38.53 kg/m.  General: Cooperative, alert, well developed, in no acute distress. HEENT: Conjunctivae and lids unremarkable. Cardiovascular: Regular rhythm.  Lungs: Normal work of breathing. Neurologic: No focal deficits.   Lab Results  Component Value Date   CREATININE 0.92 02/26/2022   BUN 13 02/26/2022   NA 139 02/26/2022   K 4.6 02/26/2022    CL 99 02/26/2022   CO2 33 (H) 02/26/2022   Lab Results  Component Value Date   ALT 17 02/26/2022   AST 15 02/26/2022   ALKPHOS 87 02/26/2022   BILITOT 0.7 02/26/2022   Lab Results  Component Value Date   HGBA1C 5.5 01/14/2022   HGBA1C 6.6 (H) 09/10/2021   HGBA1C 6.1 02/02/2021   HGBA1C 5.6 01/09/2020   HGBA1C 6.1 04/13/2019   Lab Results  Component Value Date   INSULIN 8.4 01/14/2022   INSULIN 20.1 09/10/2021   Lab Results  Component Value Date   TSH 1.10 02/26/2022   Lab Results  Component Value Date   CHOL 188 02/26/2022   HDL 47.20 02/26/2022   LDLCALC 123 (H) 02/26/2022   TRIG 87.0 02/26/2022   CHOLHDL 4 02/26/2022   Lab Results  Component Value Date   VD25OH 61.4 01/14/2022   VD25OH 21.7 (L) 09/10/2021   Lab Results  Component Value Date   WBC 4.0 02/26/2022   HGB 14.9 02/26/2022   HCT 44.1 02/26/2022   MCV 88.3 02/26/2022   PLT 411.0 (H) 02/26/2022   No results found for: "IRON", "TIBC", "FERRITIN"  Attestation Statements:   Reviewed by clinician on day of visit: allergies, medications, problem list, medical history, surgical history, family history, social history, and previous encounter notes.  I, Brendell Tyus, RMA, am acting as transcriptionist for Everardo Pacific, FNP.  I have reviewed the above documentation for accuracy and completeness, and I agree with the above. Everardo Pacific, FNP

## 2022-04-20 ENCOUNTER — Other Ambulatory Visit: Payer: Self-pay | Admitting: Family Medicine

## 2022-04-22 ENCOUNTER — Telehealth: Payer: Self-pay | Admitting: Family Medicine

## 2022-04-22 DIAGNOSIS — E1169 Type 2 diabetes mellitus with other specified complication: Secondary | ICD-10-CM

## 2022-04-22 NOTE — Telephone Encounter (Signed)
-----   Message from Ellamae Sia sent at 04/12/2022  4:14 PM EDT ----- Regarding: lab orders for Friday, 10.20.23 Lab orders, for Lipid?, Thanks

## 2022-04-23 ENCOUNTER — Other Ambulatory Visit (INDEPENDENT_AMBULATORY_CARE_PROVIDER_SITE_OTHER): Payer: BC Managed Care – PPO

## 2022-04-23 DIAGNOSIS — E785 Hyperlipidemia, unspecified: Secondary | ICD-10-CM

## 2022-04-23 DIAGNOSIS — E1169 Type 2 diabetes mellitus with other specified complication: Secondary | ICD-10-CM

## 2022-04-23 LAB — LIPID PANEL
Cholesterol: 117 mg/dL (ref 0–200)
HDL: 50.8 mg/dL (ref >39.00)
LDL Cholesterol: 53 mg/dL (ref 0–99)
NonHDL: 65.96
Total CHOL/HDL Ratio: 2
Triglycerides: 65 mg/dL (ref 0.0–149.0)
VLDL: 13 mg/dL (ref 0.0–40.0)

## 2022-04-23 LAB — ALT: ALT: 27 U/L (ref 0–35)

## 2022-04-23 LAB — AST: AST: 22 U/L (ref 0–37)

## 2022-04-26 ENCOUNTER — Ambulatory Visit (INDEPENDENT_AMBULATORY_CARE_PROVIDER_SITE_OTHER): Payer: BC Managed Care – PPO | Admitting: Adult Health

## 2022-04-26 ENCOUNTER — Encounter (INDEPENDENT_AMBULATORY_CARE_PROVIDER_SITE_OTHER): Payer: Self-pay | Admitting: Adult Health

## 2022-04-26 VITALS — BP 115/83 | HR 86 | Temp 98.0°F | Ht 67.0 in | Wt 240.0 lb

## 2022-04-26 DIAGNOSIS — Z6837 Body mass index (BMI) 37.0-37.9, adult: Secondary | ICD-10-CM

## 2022-04-26 DIAGNOSIS — E1165 Type 2 diabetes mellitus with hyperglycemia: Secondary | ICD-10-CM

## 2022-04-26 DIAGNOSIS — E669 Obesity, unspecified: Secondary | ICD-10-CM | POA: Diagnosis not present

## 2022-04-26 DIAGNOSIS — E559 Vitamin D deficiency, unspecified: Secondary | ICD-10-CM | POA: Diagnosis not present

## 2022-04-26 DIAGNOSIS — Z7985 Long-term (current) use of injectable non-insulin antidiabetic drugs: Secondary | ICD-10-CM

## 2022-04-26 MED ORDER — SEMAGLUTIDE (1 MG/DOSE) 4 MG/3ML ~~LOC~~ SOPN: 1.0000 mg | PEN_INJECTOR | SUBCUTANEOUS | 0 refills | Status: DC

## 2022-04-27 ENCOUNTER — Other Ambulatory Visit (INDEPENDENT_AMBULATORY_CARE_PROVIDER_SITE_OTHER): Payer: Self-pay | Admitting: Adult Health

## 2022-04-27 ENCOUNTER — Encounter (INDEPENDENT_AMBULATORY_CARE_PROVIDER_SITE_OTHER): Payer: Self-pay | Admitting: Adult Health

## 2022-04-27 LAB — VITAMIN D 25 HYDROXY (VIT D DEFICIENCY, FRACTURES): Vit D, 25-Hydroxy: 77.4 ng/mL (ref 30.0–100.0)

## 2022-04-28 ENCOUNTER — Other Ambulatory Visit: Payer: Self-pay | Admitting: Family Medicine

## 2022-04-28 DIAGNOSIS — Z1231 Encounter for screening mammogram for malignant neoplasm of breast: Secondary | ICD-10-CM

## 2022-04-30 NOTE — Progress Notes (Unsigned)
Chief Complaint:   OBESITY Ashley Mercado is here to discuss her progress with her obesity treatment plan along with follow-up of her obesity related diagnoses. Ashley Mercado is on following a lower carbohydrate, vegetable and lean protein rich diet plan and states she is following her eating plan approximately 70% of the time. Ashley Mercado states she is walking and lifting weights 30 minutes 3-4 times per week.  Today's visit was #: 12 Starting weight: 254 lbs Starting date: 09/10/2021 Today's weight: 240 lbs Today's date: 04/26/2022 Total lbs lost to date: 14 lbs Total lbs lost since last in-office visit: 6 lbs  Interim History: Ms. Backs provided the following intake that is typical of a day: Brunch-1000 or later, meat with vegetables Dnner-meat with vegetables.  Sometimes with small serving of orzo (rice).    Exercises after work, 4 pm.  She walks 14,000 steps at work.   Subjective:   1. Type 2 diabetes mellitus with hyperglycemia, without long-term current use of insulin (HCC) Lab Results  Component Value Date   HGBA1C 5.5 01/14/2022   HGBA1C 6.6 (H) 09/10/2021   HGBA1C 6.1 02/02/2021    Well controlled on weekly Ozempic 1 mg.   She denies mass in neck, dysphagia, dyspepsia, persistent hoarseness, abdominal pain, or N/V/Constipation.  2. Vitamin D deficiency 01/14/2022 Vitamin D level 61.4.   She is taking once weekly ergocalciferol.  She denies N/V/Muscle Weakness.  Assessment/Plan:   1. Type 2 diabetes mellitus with hyperglycemia, without long-term current use of insulin (HCC) Refill - Semaglutide, 1 MG/DOSE, 4 MG/3ML SOPN; Inject 1 mg as directed once a week.  Dispense: 3 mL; Refill: 0  2. Vitamin D deficiency Check labs today.   - VITAMIN D 25 Hydroxy (Vit-D Deficiency, Fractures)  3. Obesity with current BMI of 37.7 Ashley Mercado is currently in the action stage of change. As such, her goal is to continue with weight loss efforts. She has agreed to following a lower  carbohydrate, vegetable and lean protein rich diet plan.   Exercise goals:  As is.   Behavioral modification strategies: increasing lean protein intake, decreasing simple carbohydrates, meal planning and cooking strategies, keeping healthy foods in the home, and planning for success.  Ashley Mercado has agreed to follow-up with our clinic in 4 weeks. She was informed of the importance of frequent follow-up visits to maximize her success with intensive lifestyle modifications for her multiple health conditions.   Ashley Mercado was informed we would discuss her lab results at her next visit unless there is a critical issue that needs to be addressed sooner. Ashley Mercado agreed to keep her next visit at the agreed upon time to discuss these results.  Objective:   Blood pressure 115/83, pulse 86, temperature 98 F (36.7 C), height '5\' 7"'$  (1.702 m), weight 240 lb (108.9 kg), last menstrual period 12/28/2011, SpO2 98 %. Body mass index is 37.59 kg/m.  General: Cooperative, alert, well developed, in no acute distress. HEENT: Conjunctivae and lids unremarkable. Cardiovascular: Regular rhythm.  Lungs: Normal work of breathing. Neurologic: No focal deficits.   Lab Results  Component Value Date   CREATININE 0.92 02/26/2022   BUN 13 02/26/2022   NA 139 02/26/2022   K 4.6 02/26/2022   CL 99 02/26/2022   CO2 33 (H) 02/26/2022   Lab Results  Component Value Date   ALT 27 04/23/2022   AST 22 04/23/2022   ALKPHOS 87 02/26/2022   BILITOT 0.7 02/26/2022   Lab Results  Component Value Date   HGBA1C 5.5 01/14/2022  HGBA1C 6.6 (H) 09/10/2021   HGBA1C 6.1 02/02/2021   HGBA1C 5.6 01/09/2020   HGBA1C 6.1 04/13/2019   Lab Results  Component Value Date   INSULIN 8.4 01/14/2022   INSULIN 20.1 09/10/2021   Lab Results  Component Value Date   TSH 1.10 02/26/2022   Lab Results  Component Value Date   CHOL 117 04/23/2022   HDL 50.80 04/23/2022   LDLCALC 53 04/23/2022   TRIG 65.0 04/23/2022   CHOLHDL 2  04/23/2022   Lab Results  Component Value Date   VD25OH 77.4 04/26/2022   VD25OH 61.4 01/14/2022   VD25OH 21.7 (L) 09/10/2021   Lab Results  Component Value Date   WBC 4.0 02/26/2022   HGB 14.9 02/26/2022   HCT 44.1 02/26/2022   MCV 88.3 02/26/2022   PLT 411.0 (H) 02/26/2022   No results found for: "IRON", "TIBC", "FERRITIN"  Attestation Statements:   Reviewed by clinician on day of visit: allergies, medications, problem list, medical history, surgical history, family history, social history, and previous encounter notes.  I, Davy Pique, RMA, am acting as Location manager for Mina Marble, NP.  I have reviewed the above documentation for accuracy and completeness, and I agree with the above. -  Katy d. Danford, NP-C

## 2022-05-05 ENCOUNTER — Ambulatory Visit: Payer: BC Managed Care – PPO | Admitting: Podiatry

## 2022-05-05 DIAGNOSIS — M2142 Flat foot [pes planus] (acquired), left foot: Secondary | ICD-10-CM | POA: Diagnosis not present

## 2022-05-05 DIAGNOSIS — M76822 Posterior tibial tendinitis, left leg: Secondary | ICD-10-CM | POA: Diagnosis not present

## 2022-05-10 NOTE — Progress Notes (Signed)
  Subjective:  Patient ID: Ashley Mercado, female    DOB: [birth year],  MRN: [id number]  Chief Complaint  Patient presents with   Tendonitis    Follow up after PT    Ongoing left arch pain   51 y.o. female presents with the above complaint. History confirmed with patient.  This is been going on for some time.  She previously was under care of Dr. Cannon Kettle.  She is on her feet quite a bit on concrete floor she is a Environmental consultant at [employer].  Most the pain is on the inside of the arch.  She previously had surgery for the same issue on her right foot and has not had issues since then  Interval History: Overall doing much better  Objective:  Physical Exam: warm, good capillary refill, no trophic changes or ulcerative lesions, normal DP and PT pulses, normal sensory exam, and she has pes planus deformity with pain on palpation of the left PT tendon insertion there is palpable prominence of the navicular here, overall much less intense than previous.   Radiographs: Multiple views x-ray of the left foot: Mild to moderate pes planus deformity, there appears to be an os naviculare medial to the main body  MRI 09/28/2021 IMPRESSION: Mild posterior tibial tenosynovitis.   Mild attenuation of the anterior talofibular ligament may represent the patient's normal anatomy versus a remote partial-thickness tear.   Partial-thickness cartilage degenerative change with subchondral edema/cystic change within the far medial aspect of the talar dome.     Electronically Signed   By: Yvonne Kendall M.D.   On: 09/29/2021 11:50 Assessment:   1. Posterior tibial tendon dysfunction (PTTD) of left lower extremity   2. Acquired pes planus, left        Plan:  Patient was evaluated and treated and all questions answered.  Discussed the etiology and treatment options for posterior tibial tendinitis including stretching, formal physical therapy with an eccentric exercises therapy plan,  supportive shoegears such as a running shoe or sneaker, bracing and immobilization, topical and oral medications.  We also discussed that I do not routinely perform injections in this area because of the risk of an increased damage or rupture of the tendon.  We also discussed the role of surgical treatment of this for patients who do not improve after exhausting non-surgical treatment options.  Overall doing much better.  Hopefully we will have her orthotics from refurbishment back soon with the scaphoid pads.  We will let her know when these are ready.  I will see her back on an as-needed basis.   Return if symptoms worsen or fail to improve.

## 2022-05-24 ENCOUNTER — Ambulatory Visit (INDEPENDENT_AMBULATORY_CARE_PROVIDER_SITE_OTHER): Payer: BC Managed Care – PPO | Admitting: Family Medicine

## 2022-05-24 ENCOUNTER — Encounter (INDEPENDENT_AMBULATORY_CARE_PROVIDER_SITE_OTHER): Payer: Self-pay | Admitting: Family Medicine

## 2022-05-24 VITALS — BP 118/81 | HR 92 | Temp 98.5°F | Ht 67.0 in | Wt 238.0 lb

## 2022-05-24 DIAGNOSIS — E669 Obesity, unspecified: Secondary | ICD-10-CM | POA: Diagnosis not present

## 2022-05-24 DIAGNOSIS — E1169 Type 2 diabetes mellitus with other specified complication: Secondary | ICD-10-CM | POA: Diagnosis not present

## 2022-05-24 DIAGNOSIS — Z6837 Body mass index (BMI) 37.0-37.9, adult: Secondary | ICD-10-CM

## 2022-05-24 DIAGNOSIS — Z7985 Long-term (current) use of injectable non-insulin antidiabetic drugs: Secondary | ICD-10-CM

## 2022-05-24 DIAGNOSIS — E785 Hyperlipidemia, unspecified: Secondary | ICD-10-CM | POA: Diagnosis not present

## 2022-05-24 DIAGNOSIS — E1165 Type 2 diabetes mellitus with hyperglycemia: Secondary | ICD-10-CM | POA: Diagnosis not present

## 2022-05-24 MED ORDER — SEMAGLUTIDE (1 MG/DOSE) 4 MG/3ML ~~LOC~~ SOPN: 1.0000 mg | PEN_INJECTOR | SUBCUTANEOUS | 0 refills | Status: DC

## 2022-06-04 ENCOUNTER — Ambulatory Visit
Admission: RE | Admit: 2022-06-04 | Discharge: 2022-06-04 | Disposition: A | Discharge status: Home / Self Care | Payer: BC Managed Care – PPO | Attending: Gastroenterology | Admitting: Gastroenterology

## 2022-06-04 ENCOUNTER — Other Ambulatory Visit: Payer: Self-pay

## 2022-06-04 ENCOUNTER — Encounter
Admission: RE | Disposition: A | Discharge status: Home / Self Care | Payer: Self-pay | Source: Home / Self Care | Attending: Gastroenterology

## 2022-06-04 ENCOUNTER — Encounter: Payer: Self-pay | Admitting: Gastroenterology

## 2022-06-04 ENCOUNTER — Ambulatory Visit: Payer: BC Managed Care – PPO | Admitting: Certified Registered"

## 2022-06-04 DIAGNOSIS — Z86010 Personal history of colonic polyps: Secondary | ICD-10-CM | POA: Diagnosis not present

## 2022-06-04 DIAGNOSIS — I10 Essential (primary) hypertension: Secondary | ICD-10-CM | POA: Insufficient documentation

## 2022-06-04 DIAGNOSIS — E782 Mixed hyperlipidemia: Secondary | ICD-10-CM | POA: Insufficient documentation

## 2022-06-04 DIAGNOSIS — E119 Type 2 diabetes mellitus without complications: Secondary | ICD-10-CM | POA: Insufficient documentation

## 2022-06-04 DIAGNOSIS — Z1211 Encounter for screening for malignant neoplasm of colon: Secondary | ICD-10-CM | POA: Insufficient documentation

## 2022-06-04 DIAGNOSIS — E7800 Pure hypercholesterolemia, unspecified: Secondary | ICD-10-CM | POA: Insufficient documentation

## 2022-06-04 DIAGNOSIS — K5730 Diverticulosis of large intestine without perforation or abscess without bleeding: Secondary | ICD-10-CM | POA: Insufficient documentation

## 2022-06-04 HISTORY — PX: COLONOSCOPY WITH PROPOFOL: SHX5780

## 2022-06-04 LAB — GLUCOSE, CAPILLARY: Glucose-Capillary: 91 mg/dL (ref 70–99)

## 2022-06-04 SURGERY — COLONOSCOPY WITH PROPOFOL
Anesthesia: General

## 2022-06-04 MED ORDER — MIDAZOLAM HCL 2 MG/2ML IJ SOLN
INTRAMUSCULAR | Status: DC | PRN
  Administered 2022-06-04: 2 mg via INTRAVENOUS

## 2022-06-04 MED ORDER — LIDOCAINE HCL (CARDIAC) PF 100 MG/5ML IV SOSY
PREFILLED_SYRINGE | INTRAVENOUS | Status: DC | PRN
  Administered 2022-06-04: 60 mg via INTRAVENOUS

## 2022-06-04 MED ORDER — PROPOFOL 500 MG/50ML IV EMUL
INTRAVENOUS | Status: DC | PRN
  Administered 2022-06-04: 140 ug/kg/min via INTRAVENOUS

## 2022-06-04 MED ORDER — SUCCINYLCHOLINE CHLORIDE 200 MG/10ML IV SOSY
PREFILLED_SYRINGE | INTRAVENOUS | Status: DC | PRN
  Administered 2022-06-04: 200 mg via INTRAVENOUS

## 2022-06-04 MED ORDER — PROPOFOL 1000 MG/100ML IV EMUL
INTRAVENOUS | Status: AC
  Filled 2022-06-04: qty 200

## 2022-06-04 MED ORDER — MIDAZOLAM HCL 2 MG/2ML IJ SOLN
INTRAMUSCULAR | Status: AC
  Filled 2022-06-04: qty 2

## 2022-06-04 MED ORDER — SODIUM CHLORIDE 0.9 % IV SOLN: INTRAVENOUS | Status: DC

## 2022-06-04 MED ORDER — PROPOFOL 10 MG/ML IV BOLUS
INTRAVENOUS | Status: DC | PRN
  Administered 2022-06-04: 200 mg via INTRAVENOUS

## 2022-06-04 MED ORDER — ONDANSETRON HCL 4 MG/2ML IJ SOLN
INTRAMUSCULAR | Status: DC | PRN
  Administered 2022-06-04: 4 mg via INTRAVENOUS

## 2022-06-04 NOTE — H&P (Signed)
Ashley Bellows, MD [address] [place], [place], [place], [address] [address], [place], [place], [place], [address] Phone: [phone]  Fax: [phone]  Primary Care Physician:  Mercado, Ashley Fanny, MD   Pre-Procedure History & Physical: HPI:  Ashley Mercado is a 51 y.o. female is here for an colonoscopy.   Past Medical History:  Diagnosis Date   Allergies    Bilateral swelling of feet    DM type 2 with diabetic mixed hyperlipidemia ([place]) 10/10/2705   Folliculitis    High blood pressure    High cholesterol    Hip pain    HSV infection    Hydradenitis    Hypertension    Keloid scar    Lactose intolerance    Menorrhagia    Multiple food allergies    Prediabetes    Tired     Past Surgical History:  Procedure Laterality Date   COLONOSCOPY WITH PROPOFOL N/A 06/04/2019   Procedure: COLONOSCOPY WITH PROPOFOL;  Surgeon: Ashley Bellows, MD;  Location: [facility];  Service: Gastroenterology;  Laterality: N/A;   FOOT SURGERY Right    Ligment    Prior to Admission medications   Medication Sig Start Date End Date Taking? Authorizing Provider  albuterol (VENTOLIN HFA) 108 (90 Base) MCG/ACT inhaler Inhale into the lungs. 03/24/21  Yes [provider]  Azelastine HCl 137 MCG/SPRAY SOLN Place into both nostrils. 04/28/21  Yes [provider]  fluticasone (FLONASE) 50 MCG/ACT nasal spray Place into both nostrils. 04/28/21  Yes [provider]  levocetirizine (XYZAL) 5 MG tablet Take 2.5 mg by mouth.  01/13/17  Yes [provider]  montelukast (SINGULAIR) 10 MG tablet  01/13/17  Yes [provider]  Multiple Vitamins-Minerals (MULTIVITAMIN GUMMIES ADULT PO) Take 2 each by mouth daily.   Yes [provider]  rosuvastatin (CRESTOR) 10 MG tablet Take 1 tablet (10 mg total) by mouth daily. 03/05/22  Yes Mercado, Ashley Fanny, MD  spironolactone (ALDACTONE) 50 MG tablet TAKE ONE TABLET BY MOUTH ONE TIME DAILY 04/20/22  Yes Mercado, Ashley A, MD   EPINEPHrine 0.3 mg/0.3 mL IJ SOAJ injection See admin instructions.    [provider]  Semaglutide, 1 MG/DOSE, 4 MG/3ML SOPN Inject 1 mg as directed once a week. 05/24/22   Ashley Linden, MD    Allergies as of 03/16/2022 - Review Complete 03/05/2022  Allergen Reaction Noted   Dust mite extract Cough and Itching 01/19/2017   Fish allergy Hives 04/17/2019   Other  05/26/2012   Shrimp extract allergy skin test Itching and Swelling 01/19/2017   Sulfa antibiotics Rash 02/23/2013    Family History  Problem Relation Age of Onset   Hypertension Mother    Diabetes Mother    Sleep apnea Mother    Obesity Mother    Hypertension Father    Diabetes Father    Heart disease Father    Kidney disease Father    Cancer Maternal Grandmother        ovarian   Cancer Maternal Uncle        lymphoma    Social History   Socioeconomic History   Marital status: Married    Spouse name: Not on file   Number of children: 0   Years of education: masters   Highest education level: Not on file  Occupational History   Occupation: Conservation officer, nature, [employer]  Tobacco Use   Smoking status: Never   Smokeless tobacco: Never  Vaping Use   Vaping Use: Never used  Substance and Sexual Activity   Alcohol use: Yes    Comment: Occasionally   Drug use: No   Sexual activity: Not on file  Other Topics Concern   Not on file  Social History Narrative   Private cell: [phone].  Designated party form signed on 01/26/10 appointing no one.  May leave message on cell phone.  Lives with mom in a one story home.  No children.  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  Education: Masters   Scientist, physiological Strain: Not on file  Food Insecurity: Not on file  Transportation Needs: Not on file  Physical Activity: Not on file  Stress: Not on file  Social Connections: Not on file  Intimate Partner Violence: Not on file    Review of Systems: See HPI, otherwise  negative ROS  Physical Exam: BP 105/75   Pulse 79   Temp (!) 96.1 F (35.6 C) (Temporal)   Resp 16   Ht '5\' 7"'$  (1.702 m)   Wt 108.4 kg   LMP 12/28/2011   SpO2 100%   BMI 37.43 kg/m  General:   Alert,  pleasant and cooperative in NAD Head:  Normocephalic and atraumatic. Neck:  Supple; no masses or thyromegaly. Lungs:  Clear throughout to auscultation, normal respiratory effort.    Heart:  +S1, +S2, Regular rate and rhythm, No edema. Abdomen:  Soft, nontender and nondistended. Normal bowel sounds, without guarding, and without rebound.   Neurologic:  Alert and  oriented x4;  grossly normal neurologically.  Impression/Plan: Ashley Mercado is here for an colonoscopy to be performed for surveillance due to prior history of colon polyps   Risks, benefits, limitations, and alternatives regarding  colonoscopy have been reviewed with the patient.  Questions have been answered.  All parties agreeable.   Ashley Bellows, MD  06/04/2022, 7:52 AM

## 2022-06-04 NOTE — Anesthesia Procedure Notes (Signed)
Procedure Name: Intubation Date/Time: 06/04/2022 7:57 AM  Performed by: Lerry Liner, CRNAPre-anesthesia Checklist: Patient identified, Emergency Drugs available, Suction available and Patient being monitored Patient Re-evaluated:Patient Re-evaluated prior to induction Oxygen Delivery Method: Circle system utilized Preoxygenation: Pre-oxygenation with 100% oxygen Induction Type: IV induction Ventilation: Mask ventilation without difficulty Tube type: Oral Tube size: 6.5 mm Number of attempts: 1 Airway Equipment and Method: Stylet and Oral airway Placement Confirmation: ETT inserted through vocal cords under direct vision, positive ETCO2 and breath sounds checked- equal and bilateral Secured at: 22 cm Tube secured with: Tape Dental Injury: Teeth and Oropharynx as per pre-operative assessment

## 2022-06-04 NOTE — Anesthesia Postprocedure Evaluation (Signed)
Anesthesia Post Note  Patient: Ashley Mercado  Procedure(s) Performed: COLONOSCOPY WITH PROPOFOL  Patient location during evaluation: Endoscopy Anesthesia Type: General Level of consciousness: awake and alert Pain management: pain level controlled Vital Signs Assessment: post-procedure vital signs reviewed and stable Respiratory status: spontaneous breathing, nonlabored ventilation, respiratory function stable and patient connected to nasal cannula oxygen Cardiovascular status: blood pressure returned to baseline and stable Postop Assessment: no apparent nausea or vomiting Anesthetic complications: no   No notable events documented.   Last Vitals:  Vitals:   06/04/22 0831 06/04/22 0841  BP: 107/76 107/86  Pulse: 87 67  Resp: 20 20  Temp:    SpO2: 98%     Last Pain:  Vitals:   06/04/22 0841  TempSrc:   PainSc: 0-No pain                 Precious Haws Aaliyha Mumford

## 2022-06-04 NOTE — Anesthesia Preprocedure Evaluation (Signed)
Anesthesia Evaluation  Patient identified by MRN, date of birth, ID band Patient awake    Reviewed: Allergy & Precautions, NPO status , Patient's Chart, lab work & pertinent test results  History of Anesthesia Complications Negative for: history of anesthetic complications  Airway Mallampati: III  TM Distance: >3 FB Neck ROM: full    Dental  (+) Chipped   Pulmonary neg pulmonary ROS, neg shortness of breath   Pulmonary exam normal        Cardiovascular Exercise Tolerance: Good hypertension, (-) angina (-) Past MI and (-) DOE Normal cardiovascular exam     Neuro/Psych  PSYCHIATRIC DISORDERS       Neuromuscular disease    GI/Hepatic negative GI ROS, Neg liver ROS,neg GERD  ,,  Endo/Other  negative endocrine ROSdiabetes, Type 2    Renal/GU      Musculoskeletal   Abdominal   Peds  Hematology negative hematology ROS (+)   Anesthesia Other Findings Past Medical History: No date: Allergies No date: Bilateral swelling of feet 03/08/2022: DM type 2 with diabetic mixed hyperlipidemia (HCC) No date: Folliculitis No date: High blood pressure No date: High cholesterol No date: Hip pain 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 No date: Hydradenitis No date: Hypertension No date: Keloid scar No date: Lactose intolerance No date: Menorrhagia No date: Multiple food allergies No date: Prediabetes No date: Tired  Past Surgical History: 06/04/2019: COLONOSCOPY WITH PROPOFOL; N/A     Comment:  Procedure: COLONOSCOPY WITH PROPOFOL;  Surgeon: Jonathon Bellows, MD;  Location: [facility] ENDOSCOPY;  Service:               Gastroenterology;  Laterality: N/A; No date: FOOT SURGERY; Right     Comment:  Ligment  BMI    Body Mass Index: 37.43 kg/m      Reproductive/Obstetrics negative OB ROS                             Anesthesia Physical Anesthesia Plan  ASA: 3  Anesthesia Plan: General ETT and Rapid Sequence    Post-op Pain Management:    Induction: Intravenous  PONV Risk Score and Plan: Ondansetron, Dexamethasone, Midazolam and Treatment may vary due to age or medical condition  Airway Management Planned: Oral ETT  Additional Equipment:   Intra-op Plan:   Post-operative Plan: Extubation in OR  Informed Consent: I have reviewed the patients History and Physical, chart, labs and discussed the procedure including the risks, benefits and alternatives for the proposed anesthesia with the patient or authorized representative who has indicated his/her understanding and acceptance.     Dental Advisory Given  Plan Discussed with: Anesthesiologist, CRNA and Surgeon  Anesthesia Plan Comments: (Patient reports active GLP-1 agonist use.  After discussing (with both the patient and the surgeon) the risks of proceeding (aspiration, lung damage and death) and given the option to reschedule the patient declines to reschedule and assents to proceed with rapid sequence intubation for the procedure per ASA guidance.    Patient consented for risks of anesthesia including but not limited to:  - adverse reactions to medications - damage to eyes, teeth, lips or other oral mucosa - nerve damage due to positioning  - sore throat or hoarseness - Damage to heart, brain, nerves, lungs, other parts of body or loss of life  Patient voiced understanding.)       Anesthesia Quick Evaluation

## 2022-06-04 NOTE — Op Note (Signed)
[facility] Gastroenterology Patient Name: Ashley Mercado Procedure Date: 06/04/2022 7:19 AM MRN: [id number] Account #: [id number] Date of Birth: 1971-03-11 Admit Type: Outpatient Age: 51 Room: [facility] ENDO ROOM 4 Gender: Female Note Status: Finalized Instrument Name: Jasper Riling [id number] Procedure:             Colonoscopy Indications:           Surveillance: Personal history of adenomatous polyps                         on last colonoscopy 3 years ago Providers:             Jonathon Bellows MD, MD Referring MD:          Wynelle Fanny. Tower (Referring MD) Medicines:             Monitored Anesthesia Care Complications:         No immediate complications. Procedure:             Pre-Anesthesia Assessment:                        - Prior to the procedure, a History and Physical was                         performed, and patient medications, allergies and                         sensitivities were reviewed. The patient's tolerance                         of previous anesthesia was reviewed.                        - The risks and benefits of the procedure and the                         sedation options and risks were discussed with the                         patient. All questions were answered and informed                         consent was obtained.                        - ASA Grade Assessment: II - A patient with mild                         systemic disease.                        After obtaining informed consent, the colonoscope was                         passed under direct vision. Throughout the procedure,                         the patient's blood pressure, pulse, and oxygen                         saturations  were monitored continuously. The                         Colonoscope was introduced through the anus and                         advanced to the the cecum, identified by the                         appendiceal orifice. The colonoscopy was performed                          with ease. The patient tolerated the procedure well.                         The quality of the bowel preparation was adequate. The                         appendiceal orifice was photographed. Findings:      The perianal and digital rectal examinations were normal.      Multiple small-mouthed diverticula were found in the sigmoid colon.      The exam was otherwise without abnormality on direct and retroflexion       views. Impression:            - Diverticulosis in the sigmoid colon.                        - The examination was otherwise normal on direct and                         retroflexion views.                        - No specimens collected. Recommendation:        - Discharge patient to home (with escort).                        - Resume previous diet.                        - Continue present medications.                        - Repeat colonoscopy in 5 years for surveillance. Procedure Code(s):     --- Professional ---                        [phone], Colonoscopy, flexible; diagnostic, including                         collection of specimen(s) by brushing or washing, when                         performed (separate procedure) Diagnosis Code(s):     --- Professional ---                        Z86.010, Personal history of colonic polyps  K57.30, Diverticulosis of large intestine without                         perforation or abscess without bleeding CPT copyright 2022 American Medical Association. All rights reserved. The codes documented in this report are preliminary and upon coder review may  be revised to meet current compliance requirements. Jonathon Bellows, MD Jonathon Bellows MD, MD 06/04/2022 8:15:12 AM This report has been signed electronically. Number of Addenda: 0 Note Initiated On: 06/04/2022 7:19 AM Scope Withdrawal Time: 0 hours 10 minutes 7 seconds  Total Procedure Duration: 0 hours 11 minutes 49 seconds  Estimated Blood Loss:  Estimated blood  loss: none.      [facility]

## 2022-06-04 NOTE — Transfer of Care (Signed)
Immediate Anesthesia Transfer of Care Note  Patient: Calvary Difranco  Procedure(s) Performed: COLONOSCOPY WITH PROPOFOL  Patient Location: Endoscopy Unit  Anesthesia Type:General  Level of Consciousness: drowsy  Airway & Oxygen Therapy: Patient Spontanous Breathing and Patient connected to face mask oxygen  Post-op Assessment: Report given to RN  Post vital signs: stable  Last Vitals:  Vitals Value Taken Time  BP 107/73 06/04/22 0821  Temp    Pulse 97 06/04/22 0821  Resp 22 06/04/22 0821  SpO2 100 % 06/04/22 0821    Last Pain:  Vitals:   06/04/22 0821  TempSrc:   PainSc: 0-No pain         Complications: No notable events documented.

## 2022-06-07 NOTE — Progress Notes (Signed)
Chief Complaint:   OBESITY Ashley Mercado is here to discuss her progress with her obesity treatment plan along with follow-up of her obesity related diagnoses. Ashley Mercado is on following a lower carbohydrate, vegetable and lean protein rich diet plan and states she is following her eating plan approximately 75-76% of the time. Ashley Mercado states she is walking 10,000 steps 5 times per week.  Today's visit was #: 35 Starting weight: 254 lbs Starting date: 09/10/2021 Today's weight: 238 lbs Today's date: 05/24/2022 Total lbs lost to date: 16 lbs Total lbs lost since last in-office visit: 2  Interim History: Ashley Mercado hs been trying to stay consistent with low carb. Thinks this may not be the most doable over holidays. She is thinking she will go back to [place] for holiday. Going to mother in laws house for Thanksgiving.  Subjective:   1. Type 2 diabetes mellitus with hyperglycemia, without long-term current use of insulin (HCC) Ashley Mercado is on Ozempic 1 mg weekly. Ashley Mercado not able to get all food in plan and skipping breakfast.  2. Hyperlipidemia associated with type 2 diabetes mellitus ([place]) Ashley Mercado's last LDL 53 (at goal). She is on Crestor 10 mg.  Assessment/Plan:   1. Type 2 diabetes mellitus with hyperglycemia, without long-term current use of insulin (HCC) We will refill Ozempic 1 mg SubQ once a week for 1 month, can decrease to 0.5 mg (38 clicks) on 1 mg pen.  -Ozempic Semaglutide, 1 MG/DOSE, 4 MG/3ML SOPN; Inject 1 mg as directed once a week.  Dispense: 3 mL; Refill: 0  2. Hyperlipidemia associated with type 2 diabetes mellitus ([place]) Continue Crestor with no changes in dose =. Labs Feb 2024.  3. Obesity with current BMI of 37.4 Ashley Mercado is currently in the action stage of change. As such, her goal is to continue with weight loss efforts. She has agreed to the Category 2 Plan +100.  Exercise goals: As is. Discussed initiation of activity for January.  Behavioral modification strategies:  increasing lean protein intake, meal planning and cooking strategies, keeping healthy foods in the home, and planning for success.  Ashley Mercado has agreed to follow-up with our clinic in 4 weeks. She was informed of the importance of frequent follow-up visits to maximize her success with intensive lifestyle modifications for her multiple health conditions.   Objective:   Blood pressure 118/81, pulse 92, temperature 98.5 F (36.9 C), height '5\' 7"'$  (1.702 m), weight 238 lb (108 kg), last menstrual period 12/28/2011, SpO2 99 %. Body mass index is 37.28 kg/m.  General: Cooperative, alert, well developed, in no acute distress. HEENT: Conjunctivae and lids unremarkable. Cardiovascular: Regular rhythm.  Lungs: Normal work of breathing. Neurologic: No focal deficits.   Lab Results  Component Value Date   CREATININE 0.92 02/26/2022   BUN 13 02/26/2022   NA 139 02/26/2022   K 4.6 02/26/2022   CL 99 02/26/2022   CO2 33 (H) 02/26/2022   Lab Results  Component Value Date   ALT 27 04/23/2022   AST 22 04/23/2022   ALKPHOS 87 02/26/2022   BILITOT 0.7 02/26/2022   Lab Results  Component Value Date   HGBA1C 5.5 01/14/2022   HGBA1C 6.6 (H) 09/10/2021   HGBA1C 6.1 02/02/2021   HGBA1C 5.6 01/09/2020   HGBA1C 6.1 04/13/2019   Lab Results  Component Value Date   INSULIN 8.4 01/14/2022   INSULIN 20.1 09/10/2021   Lab Results  Component Value Date   TSH 1.10 02/26/2022   Lab Results  Component Value  Date   CHOL 117 04/23/2022   HDL 50.80 04/23/2022   LDLCALC 53 04/23/2022   TRIG 65.0 04/23/2022   CHOLHDL 2 04/23/2022   Lab Results  Component Value Date   VD25OH 77.4 04/26/2022   VD25OH 61.4 01/14/2022   VD25OH 21.7 (L) 09/10/2021   Lab Results  Component Value Date   WBC 4.0 02/26/2022   HGB 14.9 02/26/2022   HCT 44.1 02/26/2022   MCV 88.3 02/26/2022   PLT 411.0 (H) 02/26/2022   No results found for: "IRON", "TIBC", "FERRITIN"  Attestation Statements:   Reviewed by  clinician on day of visit: allergies, medications, problem list, medical history, surgical history, family history, social history, and previous encounter notes.  I, Elnora Morrison, RMA am acting as transcriptionist for Coralie Common, MD.  I have reviewed the above documentation for accuracy and completeness, and I agree with the above. - Coralie Common, MD

## 2022-06-11 DIAGNOSIS — M76822 Posterior tibial tendinitis, left leg: Secondary | ICD-10-CM

## 2022-06-22 ENCOUNTER — Ambulatory Visit (INDEPENDENT_AMBULATORY_CARE_PROVIDER_SITE_OTHER): Payer: BC Managed Care – PPO | Admitting: Family Medicine

## 2022-06-22 ENCOUNTER — Encounter (INDEPENDENT_AMBULATORY_CARE_PROVIDER_SITE_OTHER): Payer: Self-pay | Admitting: Family Medicine

## 2022-06-22 VITALS — BP 133/84 | HR 89 | Temp 97.6°F | Ht 67.0 in | Wt 242.0 lb

## 2022-06-22 DIAGNOSIS — E1165 Type 2 diabetes mellitus with hyperglycemia: Secondary | ICD-10-CM | POA: Diagnosis not present

## 2022-06-22 DIAGNOSIS — E669 Obesity, unspecified: Secondary | ICD-10-CM

## 2022-06-22 DIAGNOSIS — E785 Hyperlipidemia, unspecified: Secondary | ICD-10-CM

## 2022-06-22 DIAGNOSIS — E1169 Type 2 diabetes mellitus with other specified complication: Secondary | ICD-10-CM

## 2022-06-22 DIAGNOSIS — Z6838 Body mass index (BMI) 38.0-38.9, adult: Secondary | ICD-10-CM

## 2022-06-22 DIAGNOSIS — Z7985 Long-term (current) use of injectable non-insulin antidiabetic drugs: Secondary | ICD-10-CM

## 2022-06-22 MED ORDER — SEMAGLUTIDE (1 MG/DOSE) 4 MG/3ML ~~LOC~~ SOPN: 1.0000 mg | PEN_INJECTOR | SUBCUTANEOUS | 0 refills | Status: DC

## 2022-06-22 MED ORDER — BD PEN NEEDLE NANO 2ND GEN 32G X 4 MM MISC: 1.0000 | Freq: Two times a day (BID) | 0 refills | Status: DC

## 2022-07-02 DIAGNOSIS — Z1231 Encounter for screening mammogram for malignant neoplasm of breast: Secondary | ICD-10-CM

## 2022-07-06 ENCOUNTER — Ambulatory Visit
Admission: RE | Admit: 2022-07-06 | Discharge: 2022-07-06 | Disposition: A | Discharge status: Home / Self Care | Payer: BC Managed Care – PPO | Source: Ambulatory Visit

## 2022-07-06 DIAGNOSIS — Z1231 Encounter for screening mammogram for malignant neoplasm of breast: Secondary | ICD-10-CM

## 2022-07-07 NOTE — Progress Notes (Signed)
Chief Complaint:   OBESITY Ashley Mercado is here to discuss her progress with her obesity treatment plan along with follow-up of her obesity related diagnoses. Ashley Mercado is on the Category 2 Plan and states she is following her eating plan approximately 50% of the time. Ashley Mercado states she is walking 20-30 minutes 3-4 times per week.  Today's visit was #: 14 Starting weight: 254 lbs Starting date: 09/10/2021 Today's weight: 242 lbs Today's date: 06/22/2022 Total lbs lost to date: 12 lbs Total lbs lost since last in-office visit: 0  Interim History: Ashley Mercado has had lots of events and activities mostly limited options of nutritious food. For Christmas she is doing filet mignon. Wants to start making a plan for activity for New year. Wants to get back to [place] after Christmas.   Subjective:   1. Type 2 diabetes mellitus with hyperglycemia, without long-term current use of insulin (HCC) Ashley Mercado was doing half dose Ozempic. Out of needles.   2. Hyperlipidemia associated with type 2 diabetes mellitus ([place]) Ashley Mercado is on Crestor with no myalgias or transaminitis.  Assessment/Plan:   1. Type 2 diabetes mellitus with hyperglycemia, without long-term current use of insulin (HCC) We will refill pen needles for 1 month with 0 refills.  -Refill Insulin Pen Needle (BD PEN NEEDLE NANO 2ND GEN) 32G X 4 MM MISC; 1 Package by Does not apply route 2 (two) times daily.  Dispense: 100 each; Refill: 0  We will refill Ozempic 1 mg SQ once a week for 1 month with 0 refills.  -Refill Semaglutide, 1 MG/DOSE, 4 MG/3ML SOPN; Inject 1 mg as directed once a week.  Dispense: 3 mL; Refill: 0  2. Hyperlipidemia associated with type 2 diabetes mellitus ([place]) Labs at next appointment.  3. Obesity with current BMI of 38.0 Ashley Mercado is currently in the action stage of change. As such, her goal is to continue with weight loss efforts. She has agreed to the Category 2 Plan +100.   Exercise goals: All adults should avoid  inactivity. Some physical activity is better than none, and adults who participate in any amount of physical activity gain some health benefits.  Behavioral modification strategies: increasing lean protein intake, meal planning and cooking strategies, keeping healthy foods in the home, and planning for success.  Ashley Mercado has agreed to follow-up with our clinic in 4 weeks. She was informed of the importance of frequent follow-up visits to maximize her success with intensive lifestyle modifications for her multiple health conditions.   Objective:   Blood pressure 133/84, pulse 89, temperature 97.6 F (36.4 C), height '5\' 7"'$  (1.702 m), weight 242 lb (109.8 kg), last menstrual period 12/28/2011, SpO2 100 %. Body mass index is 37.9 kg/m.  General: Cooperative, alert, well developed, in no acute distress. HEENT: Conjunctivae and lids unremarkable. Cardiovascular: Regular rhythm.  Lungs: Normal work of breathing. Neurologic: No focal deficits.   Lab Results  Component Value Date   CREATININE 0.92 02/26/2022   BUN 13 02/26/2022   NA 139 02/26/2022   K 4.6 02/26/2022   CL 99 02/26/2022   CO2 33 (H) 02/26/2022   Lab Results  Component Value Date   ALT 27 04/23/2022   AST 22 04/23/2022   ALKPHOS 87 02/26/2022   BILITOT 0.7 02/26/2022   Lab Results  Component Value Date   HGBA1C 5.5 01/14/2022   HGBA1C 6.6 (H) 09/10/2021   HGBA1C 6.1 02/02/2021   HGBA1C 5.6 01/09/2020   HGBA1C 6.1 04/13/2019   Lab Results  Component  Value Date   INSULIN 8.4 01/14/2022   INSULIN 20.1 09/10/2021   Lab Results  Component Value Date   TSH 1.10 02/26/2022   Lab Results  Component Value Date   CHOL 117 04/23/2022   HDL 50.80 04/23/2022   LDLCALC 53 04/23/2022   TRIG 65.0 04/23/2022   CHOLHDL 2 04/23/2022   Lab Results  Component Value Date   VD25OH 77.4 04/26/2022   VD25OH 61.4 01/14/2022   VD25OH 21.7 (L) 09/10/2021   Lab Results  Component Value Date   WBC 4.0 02/26/2022   HGB 14.9  02/26/2022   HCT 44.1 02/26/2022   MCV 88.3 02/26/2022   PLT 411.0 (H) 02/26/2022   No results found for: "IRON", "TIBC", "FERRITIN"  Attestation Statements:   Reviewed by clinician on day of visit: allergies, medications, problem list, medical history, surgical history, family history, social history, and previous encounter notes.  I, Elnora Morrison, RMA am acting as transcriptionist for Coralie Common, MD.  I have reviewed the above documentation for accuracy and completeness, and I agree with the above. - Coralie Common, MD

## 2022-07-22 ENCOUNTER — Encounter: Payer: Self-pay | Admitting: Podiatry

## 2022-07-22 MED ORDER — DICLOFENAC SODIUM 75 MG PO TBEC: 75.0000 mg | DELAYED_RELEASE_TABLET | Freq: Two times a day (BID) | ORAL | 2 refills | Status: DC

## 2022-07-26 ENCOUNTER — Encounter (INDEPENDENT_AMBULATORY_CARE_PROVIDER_SITE_OTHER): Payer: Self-pay | Admitting: Family Medicine

## 2022-07-26 ENCOUNTER — Ambulatory Visit (INDEPENDENT_AMBULATORY_CARE_PROVIDER_SITE_OTHER): Payer: BC Managed Care – PPO | Admitting: Family Medicine

## 2022-07-26 VITALS — BP 122/84 | HR 86 | Temp 97.6°F | Ht 67.0 in | Wt 243.0 lb

## 2022-07-26 DIAGNOSIS — E785 Hyperlipidemia, unspecified: Secondary | ICD-10-CM

## 2022-07-26 DIAGNOSIS — Z6839 Body mass index (BMI) 39.0-39.9, adult: Secondary | ICD-10-CM

## 2022-07-26 DIAGNOSIS — E1165 Type 2 diabetes mellitus with hyperglycemia: Secondary | ICD-10-CM | POA: Diagnosis not present

## 2022-07-26 DIAGNOSIS — Z7985 Long-term (current) use of injectable non-insulin antidiabetic drugs: Secondary | ICD-10-CM

## 2022-07-26 DIAGNOSIS — Z6838 Body mass index (BMI) 38.0-38.9, adult: Secondary | ICD-10-CM

## 2022-07-26 DIAGNOSIS — E1169 Type 2 diabetes mellitus with other specified complication: Secondary | ICD-10-CM | POA: Diagnosis not present

## 2022-07-26 DIAGNOSIS — E669 Obesity, unspecified: Secondary | ICD-10-CM | POA: Diagnosis not present

## 2022-07-26 MED ORDER — SEMAGLUTIDE (1 MG/DOSE) 4 MG/3ML ~~LOC~~ SOPN: 1.0000 mg | PEN_INJECTOR | SUBCUTANEOUS | 0 refills | Status: DC

## 2022-07-27 LAB — LIPID PANEL WITH LDL/HDL RATIO
Cholesterol, Total: 146 mg/dL (ref 100–199)
HDL: 55 mg/dL (ref >39)
LDL Chol Calc (NIH): 77 mg/dL (ref 0–99)
LDL/HDL Ratio: 1.4 ratio (ref 0.0–3.2)
Triglycerides: 73 mg/dL (ref 0–149)
VLDL Cholesterol Cal: 14 mg/dL (ref 5–40)

## 2022-07-27 LAB — COMPREHENSIVE METABOLIC PANEL
ALT: 30 IU/L (ref 0–32)
AST: 19 IU/L (ref 0–40)
Albumin/Globulin Ratio: 1.5 (ref 1.2–2.2)
Albumin: 4.8 g/dL (ref 3.8–4.9)
Alkaline Phosphatase: 122 IU/L — ABNORMAL HIGH (ref 44–121)
BUN/Creatinine Ratio: 15 (ref 9–23)
BUN: 13 mg/dL (ref 6–24)
Bilirubin Total: 0.4 mg/dL (ref 0.0–1.2)
CO2: 26 mmol/L (ref 20–29)
Calcium: 10.2 mg/dL (ref 8.7–10.2)
Chloride: 99 mmol/L (ref 96–106)
Creatinine, Ser: 0.87 mg/dL (ref 0.57–1.00)
Globulin, Total: 3.3 g/dL (ref 1.5–4.5)
Glucose: 95 mg/dL (ref 70–99)
Potassium: 4.8 mmol/L (ref 3.5–5.2)
Sodium: 141 mmol/L (ref 134–144)
Total Protein: 8.1 g/dL (ref 6.0–8.5)
eGFR: 80 mL/min/{1.73_m2} (ref >59)

## 2022-07-27 LAB — HEMOGLOBIN A1C
Est. average glucose Bld gHb Est-mCnc: 114 mg/dL
Hgb A1c MFr Bld: 5.6 % (ref 4.8–5.6)

## 2022-07-27 LAB — INSULIN, RANDOM: INSULIN: 14 u[IU]/mL (ref 2.6–24.9)

## 2022-08-04 NOTE — Progress Notes (Signed)
Chief Complaint:   OBESITY Ashley Mercado is here to discuss her progress with her obesity treatment plan along with follow-up of her obesity related diagnoses. [name] is on the Category 2 Plan +100 and states she is following her eating plan approximately 60% of the time. [name] states she is exercising 30 minutes 3 times per week.  Today's visit was #: 15 Starting weight: 254 lbs Starting date: 09/10/2021 Today's weight: 243 lbs Today's date: 07/26/2022 Total lbs lost to date: 9 lbs Total lbs lost since last in-office visit: 0  Interim History: [name] has noticed more hunger recently with initiation of exercises.  She has been trying to get more protein and vegetables in.  She stayed local for the holidays.  Has not weighed out meat for dinner.  Subjective:   1. Type 2 diabetes mellitus with hyperglycemia, without long-term current use of insulin (HCC) Ashley Mercado is on Ozempic weekly.  Her last A1c was at goal.  No GI side effects noted on Ozempic.  2. Hyperlipidemia associated with type 2 diabetes mellitus ([place]) Last LDL at goal.  On statin.  No side effects noted of medications.  Assessment/Plan:   1. Type 2 diabetes mellitus with hyperglycemia, without long-term current use of insulin (HCC) We will obtain labs today.  Will refill Ozempic 1 mg Subq once weekly for 1 month with 0 refills.  -Refill Semaglutide, 1 MG/DOSE, 4 MG/3ML SOPN; Inject 1 mg as directed once a week.  Dispense: 3 mL; Refill: 0  - Comprehensive metabolic panel - Hemoglobin A1c - Insulin, random  2. Hyperlipidemia associated with type 2 diabetes mellitus ([name]) We will obtain labs today.  - Lipid Panel With LDL/HDL Ratio  3. Obesity with current BMI of 38.1 Ashley Mercado is currently in the action stage of change. As such, her goal is to continue with weight loss efforts. She has agreed to the Category 2 Plan+ 100.   Exercise goals: As is.  Behavioral modification strategies: increasing lean protein intake, meal  planning and cooking strategies, keeping healthy foods in the home, and planning for success.  Ashley Mercado has agreed to follow-up with our clinic in 4 weeks. She was informed of the importance of frequent follow-up visits to maximize her success with intensive lifestyle modifications for her multiple health conditions.   Ashley Mercado was informed we would discuss her lab results at her next visit unless there is a critical issue that needs to be addressed sooner. Ashley Mercado agreed to keep her next visit at the agreed upon time to discuss these results.  Objective:   Blood pressure 122/84, pulse 86, temperature 97.6 F (36.4 C), height '5\' 7"'$  (1.702 m), weight 243 lb (110.2 kg), last menstrual period 12/28/2011, SpO2 99 %. Body mass index is 38.06 kg/m.  General: Cooperative, alert, well developed, in no acute distress. HEENT: Conjunctivae and lids unremarkable. Cardiovascular: Regular rhythm.  Lungs: Normal work of breathing. Neurologic: No focal deficits.   Lab Results  Component Value Date   CREATININE 0.87 07/26/2022   BUN 13 07/26/2022   NA 141 07/26/2022   K 4.8 07/26/2022   CL 99 07/26/2022   CO2 26 07/26/2022   Lab Results  Component Value Date   ALT 30 07/26/2022   AST 19 07/26/2022   ALKPHOS 122 (H) 07/26/2022   BILITOT 0.4 07/26/2022   Lab Results  Component Value Date   HGBA1C 5.6 07/26/2022   HGBA1C 5.5 01/14/2022   HGBA1C 6.6 (H) 09/10/2021   HGBA1C 6.1 02/02/2021   HGBA1C 5.6  01/09/2020   Lab Results  Component Value Date   INSULIN 14.0 07/26/2022   INSULIN 8.4 01/14/2022   INSULIN 20.1 09/10/2021   Lab Results  Component Value Date   TSH 1.10 02/26/2022   Lab Results  Component Value Date   CHOL 146 07/26/2022   HDL 55 07/26/2022   LDLCALC 77 07/26/2022   TRIG 73 07/26/2022   CHOLHDL 2 04/23/2022   Lab Results  Component Value Date   VD25OH 77.4 04/26/2022   VD25OH 61.4 01/14/2022   VD25OH 21.7 (L) 09/10/2021   Lab Results  Component Value Date    WBC 4.0 02/26/2022   HGB 14.9 02/26/2022   HCT 44.1 02/26/2022   MCV 88.3 02/26/2022   PLT 411.0 (H) 02/26/2022   No results found for: "IRON", "TIBC", "FERRITIN"  Attestation Statements:   Reviewed by clinician on day of visit: allergies, medications, problem list, medical history, surgical history, family history, social history, and previous encounter notes.  I, Elnora Morrison, RMA am acting as transcriptionist for Coralie Common, MD.  I have reviewed the above documentation for accuracy and completeness, and I agree with the above. - Coralie Common, MD

## 2022-08-16 ENCOUNTER — Encounter (INDEPENDENT_AMBULATORY_CARE_PROVIDER_SITE_OTHER): Payer: Self-pay | Admitting: Family Medicine

## 2022-08-16 ENCOUNTER — Ambulatory Visit (INDEPENDENT_AMBULATORY_CARE_PROVIDER_SITE_OTHER): Payer: BC Managed Care – PPO | Admitting: Family Medicine

## 2022-08-16 VITALS — BP 107/77 | HR 101 | Temp 98.1°F | Ht 67.0 in | Wt 244.0 lb

## 2022-08-16 DIAGNOSIS — E1169 Type 2 diabetes mellitus with other specified complication: Secondary | ICD-10-CM

## 2022-08-16 DIAGNOSIS — E1165 Type 2 diabetes mellitus with hyperglycemia: Secondary | ICD-10-CM

## 2022-08-16 DIAGNOSIS — E669 Obesity, unspecified: Secondary | ICD-10-CM

## 2022-08-16 DIAGNOSIS — E785 Hyperlipidemia, unspecified: Secondary | ICD-10-CM

## 2022-08-16 DIAGNOSIS — Z6838 Body mass index (BMI) 38.0-38.9, adult: Secondary | ICD-10-CM

## 2022-08-16 DIAGNOSIS — Z7985 Long-term (current) use of injectable non-insulin antidiabetic drugs: Secondary | ICD-10-CM

## 2022-08-16 MED ORDER — SEMAGLUTIDE (1 MG/DOSE) 4 MG/3ML ~~LOC~~ SOPN: 1.0000 mg | PEN_INJECTOR | SUBCUTANEOUS | 0 refills | Status: DC

## 2022-08-16 NOTE — Progress Notes (Deleted)
Patient has been started exercising more consistently and being more mindful of food choices.  She has been traveling and she is required to eat at the cafeteria while on site.  Next few weeks she is at home and no upcoming travel, events or activities.  She and her husband are trying to cook more at home and eat out less.  She enjoys cooking at home but she doesn't enjoy cooking with other people in the kitchen.  Thinks the Category 2 will be most doable for the next few weeks to follow.  For exercise she is doing 2 days of weight training and 2 days of cardio weekly.  She is noticing a significant amount of soreness from activity initiation.

## 2022-08-25 ENCOUNTER — Encounter: Payer: Self-pay | Admitting: Nurse Practitioner

## 2022-08-25 ENCOUNTER — Ambulatory Visit: Payer: BC Managed Care – PPO | Admitting: Nurse Practitioner

## 2022-08-25 VITALS — BP 128/76 | HR 86 | Temp 97.9°F | Resp 16 | Ht 67.0 in | Wt 252.4 lb

## 2022-08-25 DIAGNOSIS — M79602 Pain in left arm: Secondary | ICD-10-CM | POA: Insufficient documentation

## 2022-08-25 MED ORDER — PREDNISONE 20 MG PO TABS: ORAL_TABLET | ORAL | 0 refills | Status: AC

## 2022-08-25 NOTE — Progress Notes (Signed)
   Acute Office Visit  Subjective:     Patient ID: Ashley Mercado, female    DOB: 09-08-1970, 52 y.o.   MRN: [id number]  Chief Complaint  Patient presents with   Arm Pain    Left arm aching X 2 weeks      Patient is in today for left arm pain with a history of hypertension, HLD, DM2.  Symptoms started approximately 2 weeks. States that she has tried ibuprofen, aleve arthritis without relief. States that it is a nagging aching pain States that she thought sleep weird but the discomfort has not gotten better.  She denies injury to the should or remote or recent injury to the neck. Denies history of blood clots  No weakness, no numbness or tinging, no swelling    Review of Systems  Constitutional:  Negative for chills and fever.  Cardiovascular:  Negative for chest pain.  Neurological:  Negative for tingling and focal weakness.        Objective:    BP 128/76   Pulse 86   Temp 97.9 F (36.6 C)   Resp 16   Ht 5' 7"$  (1.702 m)   Wt 252 lb 6 oz (114.5 kg)   LMP 12/28/2011   SpO2 99%   BMI 39.53 kg/m    Physical Exam Vitals and nursing note reviewed.  Constitutional:      Appearance: Normal appearance.  Cardiovascular:     Rate and Rhythm: Normal rate and regular rhythm.     Heart sounds: Normal heart sounds.  Pulmonary:     Effort: Pulmonary effort is normal.     Breath sounds: Normal breath sounds.  Musculoskeletal:        General: Tenderness present.     Left shoulder: Tenderness present. No swelling or deformity. Normal range of motion. Normal strength. Normal pulse.       Arms:     Comments: Bicep tendon 2+ bilaterally  "+" empty can test FROM Bilateral upper extremity strength 5/5  Neurological:     Mental Status: She is alert.     No results found for any visits on 08/25/22.      Assessment & Plan:   Problem List Items Addressed This Visit       Other   Left arm pain - Primary    Seems radicular in nature.  No injury to the shoulder or neck.   Patient has no history of blood clots.  Will treat with prednisone taper, precautions reviewed.  Patient can use heat or ice as beneficial Tylenol for extra pain relief.  Patient was given some shoulder rehab exercises to do if no improvement she can follow-up in office or see Dr. Lorelei Pont sports medicine physician.       Meds ordered this encounter  Medications   predniSONE (DELTASONE) 20 MG tablet    Sig: Take 1 tablet (20 mg total) by mouth 2 (two) times daily with a meal for 3 days, THEN 1 tablet (20 mg total) daily with breakfast for 3 days. Avoid NSAIDs like ibuporfen, aleve, naproxen, motrin, BC/Goody powders.    Dispense:  9 tablet    Refill:  0    Order Specific Question:   Supervising Provider    Answer:   TOWER, MARNE A [[id number]    Return if symptoms worsen or fail to improve.  Romilda Garret, NP

## 2022-08-25 NOTE — Patient Instructions (Signed)
Nice to see you today You can use tylenol if you need extra pain relief You can also use heat or ice which ever is the most comfortable or relieving If you are not improving let us know and we can get you in with Dr. Lorelei Pont our sports medicine physician

## 2022-08-25 NOTE — Assessment & Plan Note (Signed)
Seems radicular in nature.  No injury to the shoulder or neck.  Patient has no history of blood clots.  Will treat with prednisone taper, precautions reviewed.  Patient can use heat or ice as beneficial Tylenol for extra pain relief.  Patient was given some shoulder rehab exercises to do if no improvement she can follow-up in office or see Dr. Lorelei Pont sports medicine physician.

## 2022-08-26 NOTE — Progress Notes (Signed)
Chief Complaint:   OBESITY Ashley Mercado is here to discuss her progress with her obesity treatment plan along with follow-up of her obesity related diagnoses. Ashley Mercado is on the Category 2 Plan +100 and states she is following her eating plan approximately 60% of the time. Ashley Mercado states she is doing strength and cardio 30-45 minutes 2 times per week.  Today's visit was #: 49 Starting weight: 254 lbs Starting date: 09/10/2021 Today's weight: 244 lbs Today's date: 08/16/2022 Total lbs lost to date: 10 lbs Total lbs lost since last in-office visit: 0  Interim History: Ashley Mercado has been started exercising more consistently and being more mindful of food choices.  She has been traveling and she is required to eat at the cafeteria while on site.  Next few weeks she is at home and no upcoming travel, events or activities.  She and her husband are trying to cook more at home and eat out less.  She enjoys cooking at home but she doesn't enjoy cooking with other people in the kitchen.  Thinks the Category 2 will be most doable for the next few weeks to follow.  For exercise she is doing 2 days of weight training and 2 days of cardio weekly.  She is noticing a significant amount of soreness from activity initiation.   Subjective:   1. Type 2 diabetes mellitus with hyperglycemia, without long-term current use of insulin ([place]) Labs discussed during visit today.  Taking Ozempic 1 mg weekly.  Able to get all meat and food in.  Recent A1c at 5.6.  2. Hyperlipidemia associated with type 2 diabetes mellitus ([place]) Labs discussed during visit today.  LDL of 77 significant decreased from last lab.  On Crestor daily.  Assessment/Plan:   1. Type 2 diabetes mellitus with hyperglycemia, without long-term current use of insulin ([facility]) Will refill Ozempic 1 mg subcu once a week for 1 month with 0 refills.  -Refill Semaglutide, 1 MG/DOSE, 4 MG/3ML SOPN; Inject 1 mg as directed once a week.  Dispense: 3 mL; Refill:  0  2. Hyperlipidemia associated with type 2 diabetes mellitus ([place]) Will continue Crestor without changes in medication or dose.  3. Obesity with current BMI of 38.2 Ashley Mercado is currently in the action stage of change. As such, her goal is to continue with weight loss efforts. She has agreed to the Category 2 Plan +100.   Exercise goals: As is.  Behavioral modification strategies: increasing lean protein intake, increasing vegetables, meal planning and cooking strategies, better snacking choices, and planning for success.  Ashley Mercado has agreed to follow-up with our clinic in 4 weeks. She was informed of the importance of frequent follow-up visits to maximize her success with intensive lifestyle modifications for her multiple health conditions.   Objective:   Blood pressure 107/77, pulse (!) 101, temperature 98.1 F (36.7 C), height '5\' 7"'$  (1.702 m), weight 244 lb (110.7 kg), last menstrual period 12/28/2011, SpO2 100 %. Body mass index is 38.22 kg/m.  General: Cooperative, alert, well developed, in no acute distress. HEENT: Conjunctivae and lids unremarkable. Cardiovascular: Regular rhythm.  Lungs: Normal work of breathing. Neurologic: No focal deficits.   Lab Results  Component Value Date   CREATININE 0.87 07/26/2022   BUN 13 07/26/2022   NA 141 07/26/2022   K 4.8 07/26/2022   CL 99 07/26/2022   CO2 26 07/26/2022   Lab Results  Component Value Date   ALT 30 07/26/2022   AST 19 07/26/2022   ALKPHOS 122 (H)  07/26/2022   BILITOT 0.4 07/26/2022   Lab Results  Component Value Date   HGBA1C 5.6 07/26/2022   HGBA1C 5.5 01/14/2022   HGBA1C 6.6 (H) 09/10/2021   HGBA1C 6.1 02/02/2021   HGBA1C 5.6 01/09/2020   Lab Results  Component Value Date   INSULIN 14.0 07/26/2022   INSULIN 8.4 01/14/2022   INSULIN 20.1 09/10/2021   Lab Results  Component Value Date   TSH 1.10 02/26/2022   Lab Results  Component Value Date   CHOL 146 07/26/2022   HDL 55 07/26/2022   LDLCALC 77  07/26/2022   TRIG 73 07/26/2022   CHOLHDL 2 04/23/2022   Lab Results  Component Value Date   VD25OH 77.4 04/26/2022   VD25OH 61.4 01/14/2022   VD25OH 21.7 (L) 09/10/2021   Lab Results  Component Value Date   WBC 4.0 02/26/2022   HGB 14.9 02/26/2022   HCT 44.1 02/26/2022   MCV 88.3 02/26/2022   PLT 411.0 (H) 02/26/2022   No results found for: "IRON", "TIBC", "FERRITIN"  Attestation Statements:   Reviewed by clinician on day of visit: allergies, medications, problem list, medical history, surgical history, family history, social history, and previous encounter notes.  I, Elnora Morrison, RMA am acting as transcriptionist for Coralie Common, MD.  I have reviewed the above documentation for accuracy and completeness, and I agree with the above. - Coralie Common, MD

## 2022-09-14 ENCOUNTER — Ambulatory Visit (INDEPENDENT_AMBULATORY_CARE_PROVIDER_SITE_OTHER): Payer: BC Managed Care – PPO | Admitting: Family Medicine

## 2022-09-14 ENCOUNTER — Encounter (INDEPENDENT_AMBULATORY_CARE_PROVIDER_SITE_OTHER): Payer: Self-pay | Admitting: Family Medicine

## 2022-09-14 VITALS — BP 106/76 | HR 102 | Temp 98.6°F | Ht 67.0 in | Wt 243.0 lb

## 2022-09-14 DIAGNOSIS — E1165 Type 2 diabetes mellitus with hyperglycemia: Secondary | ICD-10-CM | POA: Diagnosis not present

## 2022-09-14 DIAGNOSIS — E559 Vitamin D deficiency, unspecified: Secondary | ICD-10-CM

## 2022-09-14 DIAGNOSIS — Z7985 Long-term (current) use of injectable non-insulin antidiabetic drugs: Secondary | ICD-10-CM

## 2022-09-14 DIAGNOSIS — Z6838 Body mass index (BMI) 38.0-38.9, adult: Secondary | ICD-10-CM | POA: Diagnosis not present

## 2022-09-14 DIAGNOSIS — E669 Obesity, unspecified: Secondary | ICD-10-CM | POA: Diagnosis not present

## 2022-09-14 MED ORDER — SEMAGLUTIDE (1 MG/DOSE) 4 MG/3ML ~~LOC~~ SOPN: 1.0000 mg | PEN_INJECTOR | SUBCUTANEOUS | 0 refills | Status: DC

## 2022-09-14 NOTE — Progress Notes (Deleted)
Since last appointment she has been doing weight bearing exercises.  She feels like this has pinched a nerve in her neck.  She has been trying to stick with Category 2.  She feels like she is more keto than really Category 2.  She has been limiting starches more than she voices the Category 2 allows.  She is doing a walk run program currently.  She mentions she will be getting her camper ready for the summer to be able to travel with that in the upcoming few months.

## 2022-09-17 ENCOUNTER — Ambulatory Visit (INDEPENDENT_AMBULATORY_CARE_PROVIDER_SITE_OTHER)
Admission: RE | Admit: 2022-09-17 | Discharge: 2022-09-17 | Disposition: A | Discharge status: Home / Self Care | Payer: BC Managed Care – PPO | Source: Ambulatory Visit | Attending: Family Medicine | Admitting: Family Medicine

## 2022-09-17 ENCOUNTER — Encounter: Payer: Self-pay | Admitting: Family Medicine

## 2022-09-17 ENCOUNTER — Ambulatory Visit: Payer: BC Managed Care – PPO | Admitting: Family Medicine

## 2022-09-17 VITALS — BP 102/68 | HR 89 | Temp 98.0°F | Ht 67.0 in | Wt 250.0 lb

## 2022-09-17 DIAGNOSIS — M79602 Pain in left arm: Secondary | ICD-10-CM

## 2022-09-17 DIAGNOSIS — M542 Cervicalgia: Secondary | ICD-10-CM

## 2022-09-17 DIAGNOSIS — M898X1 Other specified disorders of bone, shoulder: Secondary | ICD-10-CM | POA: Diagnosis not present

## 2022-09-17 MED ORDER — METHOCARBAMOL 500 MG PO TABS: 500.0000 mg | ORAL_TABLET | Freq: Three times a day (TID) | ORAL | 1 refills | Status: DC | PRN

## 2022-09-17 NOTE — Progress Notes (Unsigned)
   Subjective:    Patient ID: Ashley Mercado, female    DOB: 04-15-1971, 52 y.o.   MRN: [id number]  HPI Pt presents for shoulder problem  Wt Readings from Last 3 Encounters:  09/17/22 250 lb (113.4 kg)  09/14/22 243 lb (110.2 kg)  08/25/22 252 lb 6 oz (114.5 kg)   39.16 kg/m  Vitals:   09/17/22 1358  BP: 102/68  Pulse: 89  Temp: 98 F (36.7 C)  SpO2: 99%     Was seen by NP Cable last month for L arm pain in shoulder area  Exam pointed to poss radicular type pain with no inj to shoulder or neck Was tx with prednisone taper  Heat/ice/tylenol and was given some shoulder exercises   ? As to whether wt lifting started it  No improvement at all  Pain starts in neck  goes down L scapula area and then into arm and tingling in 2 fingers   Nagging ache Nothing relieves it   No neck positions make it worse   If she lifts something with that arm feels a burn   Worse spot is tricep area and tender at times L shoulder blade area  L neck  R side of neck is fine    Yesterday had break in the pain until cold air blew on it  No rash  No swelling   Has used heat  Voltaren gel - helps just a little  Ibuprofen , dulled the pain - 2 at a time  Does not mix with her oral diclofenac    Review of Systems     Objective:   Physical Exam        Assessment & Plan:

## 2022-09-17 NOTE — Patient Instructions (Addendum)
Xray of your cervical and thoracic spine today Continue heat/ice if helpful   Sauna is ok  Stretching is ok   Try methocarbamol (muscle relaxer) as needed   We will make a plan based on results and response to medication

## 2022-09-19 NOTE — Assessment & Plan Note (Signed)
With somre radicular sympt to L arm and scapula area  Films ordered

## 2022-09-19 NOTE — Assessment & Plan Note (Signed)
Suspect ref from CS or TS [facility] pending

## 2022-09-19 NOTE — Assessment & Plan Note (Addendum)
Reviewed recent notes and plan from NP Cable  Suspect radicular  Not improved with prednisone    Xr of CS and TS ordered -plan to follow rad review Methocarbamol px for tight muscles  Er precautions noted

## 2022-09-20 ENCOUNTER — Telehealth: Payer: Self-pay | Admitting: *Deleted

## 2022-09-20 DIAGNOSIS — M5030 Other cervical disc degeneration, unspecified cervical region: Secondary | ICD-10-CM | POA: Insufficient documentation

## 2022-09-20 DIAGNOSIS — M79602 Pain in left arm: Secondary | ICD-10-CM

## 2022-09-20 DIAGNOSIS — M542 Cervicalgia: Secondary | ICD-10-CM

## 2022-09-20 NOTE — Telephone Encounter (Signed)
Patient returned call,would like a cb 

## 2022-09-20 NOTE — Telephone Encounter (Signed)
Pt notified of xray results and Dr. Marliss Coots comments. Pt does want to see ortho, she would like to see someone in [place] but if there is no available providers [place] is okay as well. Pt advise if she hasn't received a call in 2 weeks to set up an appt to let us know.

## 2022-09-20 NOTE — Telephone Encounter (Signed)
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 Your thoracic spine film was normal

## 2022-09-20 NOTE — Telephone Encounter (Signed)
Referral is in.

## 2022-09-20 NOTE — Telephone Encounter (Signed)
Left VM requesting pt to call the office back 

## 2022-09-21 ENCOUNTER — Encounter: Payer: Self-pay | Admitting: *Deleted

## 2022-09-21 NOTE — Progress Notes (Signed)
Chief Complaint:   OBESITY Ashley Mercado is here to discuss her progress with her obesity treatment plan along with follow-up of her obesity related diagnoses. Ashley Mercado is on the Category 2 Plan +100 cal and states she is following her eating plan approximately 85% of the time. Ashley Mercado states she is doing weightbearing exercises, walking to running for 30 to 40 minutes 2-3 times per week.  Today's visit was #: 63 Starting weight: 254 lbs Starting date: 09/10/2021 Today's weight: 243 lbs Today's date: 09/14/2022 Total lbs lost to date: 10 lbs Total lbs lost since last in-office visit: 1 lb  Interim History:  Since last appointment she has been doing weight bearing exercises.  She feels like this has pinched a nerve in her neck.  She has been trying to stick with Category 2.  She feels like she is more keto than really Category 2.  She has been limiting starches more than she voices the Category 2 allows.  She is doing a walk run program currently.  She mentions she will be getting her camper ready for the summer to be able to travel with that in the upcoming few months.     Subjective:   1. Type 2 diabetes mellitus with hyperglycemia, without long-term current use of insulin ([place]) Patient is on Ozempic 1 mg weekly.  No GI side effects noted.  Last A1c is at goal.  2. Vitamin D deficiency Patient is not on a vitamin D supplement.  Last level at goal of 77.  Assessment/Plan:   1. Type 2 diabetes mellitus with hyperglycemia, without long-term current use of insulin ([facility]CC) Refill- Semaglutide, 1 MG/DOSE, 4 MG/3ML SOPN; Inject 1 mg as directed once a week.  Dispense: 3 mL; Refill: 0  2. Vitamin D deficiency Retest vitamin D in May/June 2024.  3. Obesity with current BMI of 38.1 Ashley Mercado is currently in the action stage of change. As such, her goal is to continue with weight loss efforts. She has agreed to the Category 2 Plan +100 cal.   Exercise goals:  As is.  Behavioral modification  strategies: increasing lean protein intake, meal planning and cooking strategies, keeping healthy foods in the home, and planning for success.  Ashley Mercado has agreed to follow-up with our clinic in 4-5 weeks. She was informed of the importance of frequent follow-up visits to maximize her success with intensive lifestyle modifications for her multiple health conditions.   Objective:   Blood pressure 106/76, pulse (!) 102, temperature 98.6 F (37 C), height 5\' 7"  (1.702 m), weight 243 lb (110.2 kg), last menstrual period 12/28/2011, SpO2 98 %. Body mass index is 38.06 kg/m.  General: Cooperative, alert, well developed, in no acute distress. HEENT: Conjunctivae and lids unremarkable. Cardiovascular: Regular rhythm.  Lungs: Normal work of breathing. Neurologic: No focal deficits.   Lab Results  Component Value Date   CREATININE 0.87 07/26/2022   BUN 13 07/26/2022   NA 141 07/26/2022   K 4.8 07/26/2022   CL 99 07/26/2022   CO2 26 07/26/2022   Lab Results  Component Value Date   ALT 30 07/26/2022   AST 19 07/26/2022   ALKPHOS 122 (H) 07/26/2022   BILITOT 0.4 07/26/2022   Lab Results  Component Value Date   HGBA1C 5.6 07/26/2022   HGBA1C 5.5 01/14/2022   HGBA1C 6.6 (H) 09/10/2021   HGBA1C 6.1 02/02/2021   HGBA1C 5.6 01/09/2020   Lab Results  Component Value Date   INSULIN 14.0 07/26/2022   INSULIN 8.4  01/14/2022   INSULIN 20.1 09/10/2021   Lab Results  Component Value Date   TSH 1.10 02/26/2022   Lab Results  Component Value Date   CHOL 146 07/26/2022   HDL 55 07/26/2022   LDLCALC 77 07/26/2022   TRIG 73 07/26/2022   CHOLHDL 2 04/23/2022   Lab Results  Component Value Date   VD25OH 77.4 04/26/2022   VD25OH 61.4 01/14/2022   VD25OH 21.7 (L) 09/10/2021   Lab Results  Component Value Date   WBC 4.0 02/26/2022   HGB 14.9 02/26/2022   HCT 44.1 02/26/2022   MCV 88.3 02/26/2022   PLT 411.0 (H) 02/26/2022   No results found for: "IRON", "TIBC",  "FERRITIN"  Attestation Statements:   Reviewed by clinician on day of visit: allergies, medications, problem list, medical history, surgical history, family history, social history, and previous encounter notes.  I, Davy Pique, RMA, am acting as transcriptionist for Coralie Common, MD.  I have reviewed the above documentation for accuracy and completeness, and I agree with the above. - Coralie Common, MD

## 2022-10-18 ENCOUNTER — Encounter (INDEPENDENT_AMBULATORY_CARE_PROVIDER_SITE_OTHER): Payer: Self-pay | Admitting: Family Medicine

## 2022-10-18 ENCOUNTER — Ambulatory Visit (INDEPENDENT_AMBULATORY_CARE_PROVIDER_SITE_OTHER): Payer: BC Managed Care – PPO | Admitting: Family Medicine

## 2022-10-18 VITALS — BP 102/69 | HR 81 | Temp 97.5°F | Ht 67.0 in | Wt 238.0 lb

## 2022-10-18 DIAGNOSIS — E785 Hyperlipidemia, unspecified: Secondary | ICD-10-CM | POA: Diagnosis not present

## 2022-10-18 DIAGNOSIS — E1165 Type 2 diabetes mellitus with hyperglycemia: Secondary | ICD-10-CM

## 2022-10-18 DIAGNOSIS — E1169 Type 2 diabetes mellitus with other specified complication: Secondary | ICD-10-CM | POA: Diagnosis not present

## 2022-10-18 DIAGNOSIS — E669 Obesity, unspecified: Secondary | ICD-10-CM | POA: Diagnosis not present

## 2022-10-18 DIAGNOSIS — Z7985 Long-term (current) use of injectable non-insulin antidiabetic drugs: Secondary | ICD-10-CM

## 2022-10-18 DIAGNOSIS — Z6837 Body mass index (BMI) 37.0-37.9, adult: Secondary | ICD-10-CM

## 2022-10-18 NOTE — Progress Notes (Signed)
Chief Complaint:   OBESITY Ashley Mercado is here to discuss her progress with her obesity treatment plan along with follow-up of her obesity related diagnoses. Ashley Mercado is on the Category 2 Plan+100 calorie and states she is following her eating plan approximately 80% of the time. Ashley Mercado states she is running and using weights 30-50 minutes 3 times per week.  Today's visit was #: 18 Starting weight: 254 LBS Starting date: 09/10/2021 Today's weight: 238 LBS Today's date: 10/18/2022 Total lbs lost to date: 16 LBS Total lbs lost since last in-office visit: 5 LBS  Interim History: Since last appointment she ran her 5k.  She finished at 49 minutes. She has eaten on plan 80% of the time. She has traveled a few times and has had to make adjustments to her meal plan when eating out. Her [birth year] is in 11 days.  She is going to go to the beach for the week. She is planning on bringing food with her.   Subjective:   1. Type 2 diabetes mellitus with hyperglycemia, without long-term current use of insulin Patient is on Ozempic weekly.  No GI side effects noted.  Patient's last A1c was 5.6, controlled.  2. Hyperlipidemia associated with type 2 diabetes mellitus Patient is on Crestor with no myalgias or transaminitis.  Last LDL 77, not at goal yet.  Assessment/Plan:   1. Type 2 diabetes mellitus with hyperglycemia, without long-term current use of insulin Refill- Semaglutide, 1 MG/DOSE, 4 MG/3ML SOPN; Inject 1 mg as directed once a week.  Dispense: 3 mL; Refill: 0   2. Hyperlipidemia associated with type 2 diabetes mellitus Continue Crestor no change in dose.  3. BMI 37.0-37.9, adult  4. Obesity with starting BMI of 39.9 Ashley Mercado is currently in the action stage of change. As such, her goal is to continue with weight loss efforts. She has agreed to the Category 2 Plan +100-calorie  Exercise goals: All adults should avoid inactivity. Some physical activity is better than none, and adults who  participate in any amount of physical activity gain some health benefits.  Behavioral modification strategies: increasing lean protein intake, meal planning and cooking strategies, keeping healthy foods in the home, and planning for success.  Ashley Mercado has agreed to follow-up with our clinic in 4-5 weeks. She was informed of the importance of frequent follow-up visits to maximize her success with intensive lifestyle modifications for her multiple health conditions.   Objective:   Blood pressure 102/69, pulse 81, temperature (!) 97.5 F (36.4 C), height 5\' 7"  (1.702 m), weight 238 lb (108 kg), last menstrual period 12/28/2011, SpO2 98 %. Body mass index is 37.28 kg/m.  General: Cooperative, alert, well developed, in no acute distress. HEENT: Conjunctivae and lids unremarkable. Cardiovascular: Regular rhythm.  Lungs: Normal work of breathing. Neurologic: No focal deficits.   Lab Results  Component Value Date   CREATININE 0.87 07/26/2022   BUN 13 07/26/2022   NA 141 07/26/2022   K 4.8 07/26/2022   CL 99 07/26/2022   CO2 26 07/26/2022   Lab Results  Component Value Date   ALT 30 07/26/2022   AST 19 07/26/2022   ALKPHOS 122 (H) 07/26/2022   BILITOT 0.4 07/26/2022   Lab Results  Component Value Date   HGBA1C 5.6 07/26/2022   HGBA1C 5.5 01/14/2022   HGBA1C 6.6 (H) 09/10/2021   HGBA1C 6.1 02/02/2021   HGBA1C 5.6 01/09/2020   Lab Results  Component Value Date   INSULIN 14.0 07/26/2022   INSULIN 8.4  01/14/2022   INSULIN 20.1 09/10/2021   Lab Results  Component Value Date   TSH 1.10 02/26/2022   Lab Results  Component Value Date   CHOL 146 07/26/2022   HDL 55 07/26/2022   LDLCALC 77 07/26/2022   TRIG 73 07/26/2022   CHOLHDL 2 04/23/2022   Lab Results  Component Value Date   VD25OH 77.4 04/26/2022   VD25OH 61.4 01/14/2022   VD25OH 21.7 (L) 09/10/2021   Lab Results  Component Value Date   WBC 4.0 02/26/2022   HGB 14.9 02/26/2022   HCT 44.1 02/26/2022   MCV 88.3  02/26/2022   PLT 411.0 (H) 02/26/2022   No results found for: "IRON", "TIBC", "FERRITIN"  Attestation Statements:   Reviewed by clinician on day of visit: allergies, medications, problem list, medical history, surgical history, family history, social history, and previous encounter notes.  I, Malcolm Metro, RMA, am acting as transcriptionist for Reuben Likes, MD.  I have reviewed the above documentation for accuracy and completeness, and I agree with the above. - Reuben Likes, MD

## 2022-10-22 MED ORDER — SEMAGLUTIDE (1 MG/DOSE) 4 MG/3ML ~~LOC~~ SOPN: 1.0000 mg | PEN_INJECTOR | SUBCUTANEOUS | 0 refills | Status: DC

## 2022-11-15 ENCOUNTER — Encounter (INDEPENDENT_AMBULATORY_CARE_PROVIDER_SITE_OTHER): Payer: Self-pay | Admitting: Family Medicine

## 2022-11-15 ENCOUNTER — Ambulatory Visit (INDEPENDENT_AMBULATORY_CARE_PROVIDER_SITE_OTHER): Payer: BC Managed Care – PPO | Admitting: Family Medicine

## 2022-11-15 VITALS — BP 113/79 | HR 89 | Temp 98.5°F | Ht 67.0 in | Wt 240.0 lb

## 2022-11-15 DIAGNOSIS — E669 Obesity, unspecified: Secondary | ICD-10-CM

## 2022-11-15 DIAGNOSIS — E1165 Type 2 diabetes mellitus with hyperglycemia: Secondary | ICD-10-CM

## 2022-11-15 DIAGNOSIS — Z6837 Body mass index (BMI) 37.0-37.9, adult: Secondary | ICD-10-CM

## 2022-11-15 DIAGNOSIS — E785 Hyperlipidemia, unspecified: Secondary | ICD-10-CM

## 2022-11-15 DIAGNOSIS — E1169 Type 2 diabetes mellitus with other specified complication: Secondary | ICD-10-CM | POA: Diagnosis not present

## 2022-11-15 DIAGNOSIS — Z7985 Long-term (current) use of injectable non-insulin antidiabetic drugs: Secondary | ICD-10-CM

## 2022-11-15 MED ORDER — SEMAGLUTIDE (1 MG/DOSE) 4 MG/3ML ~~LOC~~ SOPN: 1.0000 mg | PEN_INJECTOR | SUBCUTANEOUS | 0 refills | Status: DC

## 2022-11-15 NOTE — Progress Notes (Unsigned)
Chief Complaint:   OBESITY Ashley Mercado is here to discuss her progress with her obesity treatment plan along with follow-up of her obesity related diagnoses. Ashley Mercado is on the Category 2 Plan + 100 calories and states she is following her eating plan approximately 10-20% of the time. Ashley Mercado states she is doing 0 minutes 0 times per week.  Today's visit was #: 19 Starting weight: 254 lbs Starting date: 09/10/2021 Today's weight: 240 lbs Today's date: 11/15/2022 Total lbs lost to date: 14 Total lbs lost since last in-office visit: 0  Interim History: Patient has had 3 deaths since last appointment- classmate, friend's father, first cousin.  She mentions that she has struggled to maintain food choices and staying on plan due to the emotional toll the death's had on her.  She wants to take a trip with her family (brothers and parent) to ensure she is taking enough care of herself.  She is going to get back on plan over the next few weeks.  She is trying hello fresh and some vegetarian meals the next few weeks.  Got her bike all tuned up and ready to start cycling.   Subjective:   1. Type 2 diabetes mellitus with hyperglycemia, without long-term current use of insulin (HCC) Ashley Mercado is on Ozempic weekly.  Her last A1c was controlled from January.  2. Hyperlipidemia associated with type 2 diabetes mellitus (HCC) Ashley Mercado last LDL was 77 (not at goal).  She is on Crestor daily.  Assessment/Plan:   1. Type 2 diabetes mellitus with hyperglycemia, without long-term current use of insulin (HCC) We will refill Ozempic at 1 mg for 1 month.  - Semaglutide, 1 MG/DOSE, 4 MG/3ML SOPN; Inject 1 mg as directed once a week.  Dispense: 3 mL; Refill: 0  2. Hyperlipidemia associated with type 2 diabetes mellitus (HCC) Ashley Mercado will have her labs done with her PCP in August 2024.  3. BMI 37.0-37.9, adult  4. Obesity with starting BMI of 39.9 Ashley Mercado is currently in the action stage of change. As such, her goal  is to continue with weight loss efforts. She has agreed to the Category 2 Plan + 100 calories.   Exercise goals: All adults should avoid inactivity. Some physical activity is better than none, and adults who participate in any amount of physical activity gain some health benefits. Patient is to start biking.   Behavioral modification strategies: increasing lean protein intake, meal planning and cooking strategies, keeping healthy foods in the home, and planning for success.  Ashley Mercado has agreed to follow-up with our clinic in 4 weeks. She was informed of the importance of frequent follow-up visits to maximize her success with intensive lifestyle modifications for her multiple health conditions.   Objective:   Blood pressure 113/79, pulse 89, temperature 98.5 F (36.9 C), height 5\' 7"  (1.702 m), weight 240 lb (108.9 kg), last menstrual period 12/28/2011, SpO2 99 %. Body mass index is 37.59 kg/m.  General: Cooperative, alert, well developed, in no acute distress. HEENT: Conjunctivae and lids unremarkable. Cardiovascular: Regular rhythm.  Lungs: Normal work of breathing. Neurologic: No focal deficits.   Lab Results  Component Value Date   CREATININE 0.87 07/26/2022   BUN 13 07/26/2022   NA 141 07/26/2022   K 4.8 07/26/2022   CL 99 07/26/2022   CO2 26 07/26/2022   Lab Results  Component Value Date   ALT 30 07/26/2022   AST 19 07/26/2022   ALKPHOS 122 (H) 07/26/2022   BILITOT 0.4 07/26/2022  Lab Results  Component Value Date   HGBA1C 5.6 07/26/2022   HGBA1C 5.5 01/14/2022   HGBA1C 6.6 (H) 09/10/2021   HGBA1C 6.1 02/02/2021   HGBA1C 5.6 01/09/2020   Lab Results  Component Value Date   INSULIN 14.0 07/26/2022   INSULIN 8.4 01/14/2022   INSULIN 20.1 09/10/2021   Lab Results  Component Value Date   TSH 1.10 02/26/2022   Lab Results  Component Value Date   CHOL 146 07/26/2022   HDL 55 07/26/2022   LDLCALC 77 07/26/2022   TRIG 73 07/26/2022   CHOLHDL 2 04/23/2022    Lab Results  Component Value Date   VD25OH 77.4 04/26/2022   VD25OH 61.4 01/14/2022   VD25OH 21.7 (L) 09/10/2021   Lab Results  Component Value Date   WBC 4.0 02/26/2022   HGB 14.9 02/26/2022   HCT 44.1 02/26/2022   MCV 88.3 02/26/2022   PLT 411.0 (H) 02/26/2022   No results found for: "IRON", "TIBC", "FERRITIN"  Attestation Statements:   Reviewed by clinician on day of visit: allergies, medications, problem list, medical history, surgical history, family history, social history, and previous encounter notes.   I, Burt Knack, am acting as transcriptionist for Reuben Likes, MD.  I have reviewed the above documentation for accuracy and completeness, and I agree with the above. - Reuben Likes, MD

## 2022-12-13 ENCOUNTER — Ambulatory Visit (INDEPENDENT_AMBULATORY_CARE_PROVIDER_SITE_OTHER): Payer: BC Managed Care – PPO | Admitting: Family Medicine

## 2022-12-13 ENCOUNTER — Encounter (INDEPENDENT_AMBULATORY_CARE_PROVIDER_SITE_OTHER): Payer: Self-pay | Admitting: Family Medicine

## 2022-12-13 VITALS — BP 108/75 | HR 95 | Temp 97.6°F | Ht 67.0 in | Wt 243.0 lb

## 2022-12-13 DIAGNOSIS — E1165 Type 2 diabetes mellitus with hyperglycemia: Secondary | ICD-10-CM | POA: Diagnosis not present

## 2022-12-13 DIAGNOSIS — Z6838 Body mass index (BMI) 38.0-38.9, adult: Secondary | ICD-10-CM

## 2022-12-13 DIAGNOSIS — Z7985 Long-term (current) use of injectable non-insulin antidiabetic drugs: Secondary | ICD-10-CM

## 2022-12-13 DIAGNOSIS — J302 Other seasonal allergic rhinitis: Secondary | ICD-10-CM

## 2022-12-13 DIAGNOSIS — E669 Obesity, unspecified: Secondary | ICD-10-CM

## 2022-12-13 MED ORDER — SEMAGLUTIDE (1 MG/DOSE) 4 MG/3ML ~~LOC~~ SOPN: 1.0000 mg | PEN_INJECTOR | SUBCUTANEOUS | 0 refills | Status: DC

## 2022-12-13 NOTE — Progress Notes (Signed)
Chief Complaint:   OBESITY Ashley Mercado is here to discuss her progress with her obesity treatment plan along with follow-up of her obesity related diagnoses. Lewis is on the Category 2 Plan + 100 calories and states she is following her eating plan approximately 20% of the time. Latarshia states she is exercising with body weights for 30 minutes 3 times per week.  Today's visit was #: 20 Starting weight: 254 lbs Starting date: 09/10/2021 Today's weight: 243 lbs Today's date: 12/13/2022 Total lbs lost to date: 11 Total lbs lost since last in-office visit: 0  Interim History: Patient has attended many graduation parties and celebrations over the last month. This led to more consistent eating off plan.  She is not anticipating any more invitations to any other events. For 4th of July she is planning to work.  Foodwise she would like more quick options for food.  Her husband got a new grill and so he is grilling more.  She would like to do more activity.   Subjective:   1. Type 2 diabetes mellitus with hyperglycemia, without long-term current use of insulin (HCC) Patient is taking Ozempic in the evening to avoid some nausea effects.  Her last A1c was 5.6 and insulin 14.0.  2. Seasonal allergies Patient has been consistently taking all of her medications.  She has azelastine, Flonase, Xyzal, and Singulair.  Her symptoms are very well-controlled.  Assessment/Plan:   1. Type 2 diabetes mellitus with hyperglycemia, without long-term current use of insulin (HCC) Patient will continue Ozempic at 1 mg, and we will refill for 1 month.  - Semaglutide, 1 MG/DOSE, 4 MG/3ML SOPN; Inject 1 mg as directed once a week.  Dispense: 3 mL; Refill: 0  2. Seasonal allergies Patient will continue with her current medications.  3. BMI 38.0-38.9,adult  4. Obesity with starting BMI of 39.9 Kathy is currently in the action stage of change. As such, her goal is to continue with weight loss efforts. She has  agreed to the Category 2 Plan + 100 calories.   Exercise goals: All adults should avoid inactivity. Some physical activity is better than none, and adults who participate in any amount of physical activity gain some health benefits.  Behavioral modification strategies: increasing lean protein intake, meal planning and cooking strategies, keeping healthy foods in the home, and planning for success.  Chastity has agreed to follow-up with our clinic in 4 weeks. She was informed of the importance of frequent follow-up visits to maximize her success with intensive lifestyle modifications for her multiple health conditions.   Objective:   Blood pressure 108/75, pulse 95, temperature 97.6 F (36.4 C), height 5\' 7"  (1.702 m), weight 243 lb (110.2 kg), last menstrual period 12/28/2011, SpO2 98 %. Body mass index is 38.06 kg/m.  General: Cooperative, alert, well developed, in no acute distress. HEENT: Conjunctivae and lids unremarkable. Cardiovascular: Regular rhythm.  Lungs: Normal work of breathing. Neurologic: No focal deficits.   Lab Results  Component Value Date   CREATININE 0.87 07/26/2022   BUN 13 07/26/2022   NA 141 07/26/2022   K 4.8 07/26/2022   CL 99 07/26/2022   CO2 26 07/26/2022   Lab Results  Component Value Date   ALT 30 07/26/2022   AST 19 07/26/2022   ALKPHOS 122 (H) 07/26/2022   BILITOT 0.4 07/26/2022   Lab Results  Component Value Date   HGBA1C 5.6 07/26/2022   HGBA1C 5.5 01/14/2022   HGBA1C 6.6 (H) 09/10/2021   HGBA1C 6.1  02/02/2021   HGBA1C 5.6 01/09/2020   Lab Results  Component Value Date   INSULIN 14.0 07/26/2022   INSULIN 8.4 01/14/2022   INSULIN 20.1 09/10/2021   Lab Results  Component Value Date   TSH 1.10 02/26/2022   Lab Results  Component Value Date   CHOL 146 07/26/2022   HDL 55 07/26/2022   LDLCALC 77 07/26/2022   TRIG 73 07/26/2022   CHOLHDL 2 04/23/2022   Lab Results  Component Value Date   VD25OH 77.4 04/26/2022   VD25OH 61.4  01/14/2022   VD25OH 21.7 (L) 09/10/2021   Lab Results  Component Value Date   WBC 4.0 02/26/2022   HGB 14.9 02/26/2022   HCT 44.1 02/26/2022   MCV 88.3 02/26/2022   PLT 411.0 (H) 02/26/2022   No results found for: "IRON", "TIBC", "FERRITIN"  Attestation Statements:   Reviewed by clinician on day of visit: allergies, medications, problem list, medical history, surgical history, family history, social history, and previous encounter notes.   I, Burt Knack, am acting as transcriptionist for Reuben Likes, MD.  I have reviewed the above documentation for accuracy and completeness, and I agree with the above. - Reuben Likes, MD

## 2022-12-31 LAB — HM DIABETES EYE EXAM

## 2023-01-11 ENCOUNTER — Telehealth (INDEPENDENT_AMBULATORY_CARE_PROVIDER_SITE_OTHER): Payer: BC Managed Care – PPO | Admitting: Family Medicine

## 2023-01-11 DIAGNOSIS — E669 Obesity, unspecified: Secondary | ICD-10-CM | POA: Diagnosis not present

## 2023-01-11 DIAGNOSIS — Z7985 Long-term (current) use of injectable non-insulin antidiabetic drugs: Secondary | ICD-10-CM

## 2023-01-11 DIAGNOSIS — E785 Hyperlipidemia, unspecified: Secondary | ICD-10-CM

## 2023-01-11 DIAGNOSIS — E1165 Type 2 diabetes mellitus with hyperglycemia: Secondary | ICD-10-CM

## 2023-01-11 DIAGNOSIS — E1169 Type 2 diabetes mellitus with other specified complication: Secondary | ICD-10-CM

## 2023-01-11 DIAGNOSIS — Z6838 Body mass index (BMI) 38.0-38.9, adult: Secondary | ICD-10-CM

## 2023-01-11 MED ORDER — SEMAGLUTIDE (1 MG/DOSE) 4 MG/3ML ~~LOC~~ SOPN
1.0000 mg | PEN_INJECTOR | SUBCUTANEOUS | 0 refills | Status: DC
Start: 2023-01-11 — End: 2023-02-10

## 2023-01-11 NOTE — Progress Notes (Signed)
TeleHealth Visit:  Due to the COVID-19 pandemic, this visit was completed with telemedicine (audio/video) technology to reduce patient and provider exposure as well as to preserve personal protective equipment.   Ashley Mercado has verbally consented to this TeleHealth visit. The patient is located at home, the provider is located at home. The participants in this visit include the listed provider and patient. The visit was conducted today via MyChart video.   Chief Complaint: OBESITY Ashley Mercado is here to discuss her progress with her obesity treatment plan along with follow-up of her obesity related diagnoses. Ashley Mercado is on the Category 2 Plan and states she is following her eating plan approximately 70% of the time. Ashley Mercado states she is exercising for 35-45 minutes 5 times per week.  Today's visit was #: 21 Starting weight: 254 lbs Starting date: 09/10/2021  Interim History: Starting July 1 she started [employer] which is an online exercise program thru facebook which she started.  Three times a week she is doing full body workout and then the other days is cardio.  She is feeling much more energetic since starting it.  Her clothes are fitting looser since starting.  She is eating more fruit and substituting this for bread calories.  She is going to [facility] on July 21 for about a week.  Would like to continue to stay mindful of food choices and ensuring she is getting enough protein in throughout the day.  Subjective:   1. Type 2 diabetes mellitus with hyperglycemia, without long-term current use of insulin Ashley Mercado) Patient has an appointment with her PCP in September, but labs in August.  Last A1c was 5.6.  Eye exam at the end of June was stable and normal.  2. Hyperlipidemia associated with type 2 diabetes mellitus Ashley Mercado) Patient is on statin daily.  Her last LDL was 77 in January, and next labs are scheduled for August.  Assessment/Plan:   1. Type 2 diabetes mellitus with hyperglycemia, without  long-term current use of insulin (Ashley Mercado) Patient will continue Ozempic 1 mg, and we will refill for 1 month.  - Semaglutide, 1 MG/DOSE, 4 MG/3ML SOPN; Inject 1 mg as directed once a week.  Dispense: 3 mL; Refill: 0  2. Hyperlipidemia associated with type 2 diabetes mellitus (Ashley Mercado) Patient will follow-up on labs with her PCP next month.  3. BMI 38.0-38.9,adult  4. Obesity with starting BMI of 39.9 Ashley Mercado is currently in the action stage of change. As such, her goal is to continue with weight loss efforts. She has agreed to the Category 2 Plan + 100 calories.   Exercise goals: As is.   Behavioral modification strategies: increasing lean protein intake, meal planning and cooking strategies, keeping healthy foods in the home, and planning for success.  Ashley Mercado has agreed to follow-up with our clinic in 4 weeks. She was informed of the importance of frequent follow-up visits to maximize her success with intensive lifestyle modifications for her multiple health conditions.  Objective:   VITALS: Per patient if applicable, see vitals. GENERAL: Alert and in no acute distress. CARDIOPULMONARY: No increased WOB. Speaking in clear sentences.  PSYCH: Pleasant and cooperative. Speech normal rate and rhythm. Affect is appropriate. Insight and judgement are appropriate. Attention is focused, linear, and appropriate.  NEURO: Oriented as arrived to appointment on time with no prompting.   Lab Results  Component Value Date   CREATININE 0.87 07/26/2022   BUN 13 07/26/2022   NA 141 07/26/2022   K 4.8 07/26/2022   CL  99 07/26/2022   CO2 26 07/26/2022   Lab Results  Component Value Date   ALT 30 07/26/2022   AST 19 07/26/2022   ALKPHOS 122 (H) 07/26/2022   BILITOT 0.4 07/26/2022   Lab Results  Component Value Date   HGBA1C 5.6 07/26/2022   HGBA1C 5.5 01/14/2022   HGBA1C 6.6 (H) 09/10/2021   HGBA1C 6.1 02/02/2021   HGBA1C 5.6 01/09/2020   Lab Results  Component Value Date   INSULIN 14.0  07/26/2022   INSULIN 8.4 01/14/2022   INSULIN 20.1 09/10/2021   Lab Results  Component Value Date   TSH 1.10 02/26/2022   Lab Results  Component Value Date   CHOL 146 07/26/2022   HDL 55 07/26/2022   LDLCALC 77 07/26/2022   TRIG 73 07/26/2022   CHOLHDL 2 04/23/2022   Lab Results  Component Value Date   VD25OH 77.4 04/26/2022   VD25OH 61.4 01/14/2022   VD25OH 21.7 (L) 09/10/2021   Lab Results  Component Value Date   WBC 4.0 02/26/2022   HGB 14.9 02/26/2022   HCT 44.1 02/26/2022   MCV 88.3 02/26/2022   PLT 411.0 (H) 02/26/2022   No results found for: "IRON", "TIBC", "FERRITIN"  Attestation Statements:   Reviewed by clinician on day of visit: allergies, medications, problem list, medical history, surgical history, family history, social history, and previous encounter notes.   I, Burt Knack, am acting as transcriptionist for Reuben Likes, MD.  I have reviewed the above documentation for accuracy and completeness, and I agree with the above. - Reuben Likes, MD

## 2023-02-01 ENCOUNTER — Other Ambulatory Visit: Payer: Self-pay | Admitting: Family Medicine

## 2023-02-10 ENCOUNTER — Encounter (INDEPENDENT_AMBULATORY_CARE_PROVIDER_SITE_OTHER): Payer: Self-pay | Admitting: Family Medicine

## 2023-02-10 ENCOUNTER — Ambulatory Visit (INDEPENDENT_AMBULATORY_CARE_PROVIDER_SITE_OTHER): Payer: BC Managed Care – PPO | Admitting: Family Medicine

## 2023-02-10 VITALS — BP 121/84 | HR 89 | Temp 98.2°F | Ht 67.0 in | Wt 243.0 lb

## 2023-02-10 DIAGNOSIS — E669 Obesity, unspecified: Secondary | ICD-10-CM

## 2023-02-10 DIAGNOSIS — Z7985 Long-term (current) use of injectable non-insulin antidiabetic drugs: Secondary | ICD-10-CM

## 2023-02-10 DIAGNOSIS — E785 Hyperlipidemia, unspecified: Secondary | ICD-10-CM

## 2023-02-10 DIAGNOSIS — Z6837 Body mass index (BMI) 37.0-37.9, adult: Secondary | ICD-10-CM

## 2023-02-10 DIAGNOSIS — E1165 Type 2 diabetes mellitus with hyperglycemia: Secondary | ICD-10-CM | POA: Diagnosis not present

## 2023-02-10 DIAGNOSIS — E1169 Type 2 diabetes mellitus with other specified complication: Secondary | ICD-10-CM | POA: Diagnosis not present

## 2023-02-10 DIAGNOSIS — Z6838 Body mass index (BMI) 38.0-38.9, adult: Secondary | ICD-10-CM

## 2023-02-10 MED ORDER — SEMAGLUTIDE (1 MG/DOSE) 4 MG/3ML ~~LOC~~ SOPN: 1.0000 mg | PEN_INJECTOR | SUBCUTANEOUS | 0 refills | Status: DC

## 2023-02-10 NOTE — Progress Notes (Signed)
Ashley Mercado, D.O.  ABFM, ABOM Specializing in Clinical Bariatric Medicine  Office located at: [address] [place], [place]  [address]     Assessment and Plan:   Medications Discontinued During This Encounter  Medication Reason   Semaglutide, 1 MG/DOSE, 4 MG/3ML SOPN Reorder    Meds ordered this encounter  Medications   Semaglutide, 1 MG/DOSE, 4 MG/3ML SOPN    Sig: Inject 1 mg as directed once a week.    Dispense:  3 mL    Refill:  0    Type 2 diabetes mellitus with hyperglycemia, without long-term current use of insulin (HCC) Assessment: Condition is Not at goal.. Pt checks her blood sugar daily. This is well controlled with Semaglutide 1mg  weekly and she tolerates this well and denies any adverse side effects. She denies excess hunger and may struggle with meeting her protein goals as she will get full.  Lab Results  Component Value Date   HGBA1C 5.6 07/26/2022   HGBA1C 5.5 01/14/2022   HGBA1C 6.6 (H) 09/10/2021   INSULIN 14.0 07/26/2022   INSULIN 8.4 01/14/2022   INSULIN 20.1 09/10/2021    Plan: - Recommend pt obtain labs since it has been 6 months but she has a upcoming physical with her PCP  and prefers to wait to have labs done then.  - Continue Semaglutide weekly. I will refill today.   - Reminded Ashley Mercado if she feels poorly- check Blood Sugar and Blood Pressure at that time.     - Recheck labs in 3 months if not done at Endo provider / PCP.   - Importance of f/up with PCP and all other specialists, as scheduled, was stressed to the patient today    Hyperlipidemia associated with type 2 diabetes mellitus (HCC) Assessment: Condition is stable. This is well controlled with Crestor 10mg  daily. She tolerates this well and denies an adverse side effects.  Lab Results  Component Value Date   CHOL 146 07/26/2022   HDL 55 07/26/2022   LDLCALC 77 07/26/2022   TRIG 73 07/26/2022   CHOLHDL 2 04/23/2022   Plan: - Continue with Crestor daily as  directed by her PCP.   - Ashley Mercado agrees to continue with meds and/or our treatment plan of a heart-heathy, low cholesterol meal plan. I stressed the importance that patient continue with our prudent nutritional plan that is low in saturated and trans fats, and low in fatty carbs to improve these numbers.   - We will continue routine screening as patient continues to achieve health goals along their weight loss journey    TREATMENT PLAN FOR OBESITY: BMI 37.0-37.9, adult- current BMI 38.05 Obesity with starting BMI of 39.9 Assessment:  Ashley Mercado is here to discuss her progress with her obesity treatment plan along with follow-up of her obesity related diagnoses. See Medical Weight Management Flowsheet for complete bioelectrical impedance results.  Condition is docourse: improving. Biometric data collected today, was reviewed with patient.   Since last office visit on 0610/2024 patient's  Muscle mass has increased by 1.8lb. Fat mass has decreased by 2.6lb. Total body water has decreased by 2.2lb.  Counseling done on how various foods will affect these numbers and how to maximize success  Total lbs lost to date: 11lbs Total weight loss percentage to date: 4.33%   Plan: - Continue to adhere to the prescribed category 2 meal plan with 100 snack calories.    Behavioral Intervention Additional resources provided today: healthy concepts on simple  carbs,   Evidence-based interventions for health behavior change were utilized today including the discussion of self monitoring techniques, problem-solving barriers and SMART goal setting techniques.   Regarding patient's less desirable eating habits and patterns, we employed the technique of small changes.  Pt will specifically work on: Continue to follow our prudent nutritional meal plan for next visit.    Recommended Physical Activity Goals  Ashley Mercado has been advised to slowly work up to 150 minutes of moderate intensity aerobic activity a  week and strengthening exercises 2-3 times per week for cardiovascular health, weight loss maintenance and preservation of muscle mass.   She has agreed to Continue current level of physical activity    FOLLOW UP: Return in about 4 weeks (around 03/10/2023).  She was informed of the importance of frequent follow up visits to maximize her success with intensive lifestyle modifications for her multiple health conditions.  Subjective:   Chief complaint: Obesity Ashley Mercado is here to discuss her progress with her obesity treatment plan. She is on the the Category 2 Plan with 100 snack calories and states she is following her eating plan approximately 50% of the time. She states she is not exercising.  Interval History:  Ashley Mercado is here for a follow up office visit.     Since last OV she has been doing well and recently returned from vacation from [facility]. She notes that she was following the plan better 2 weeks before she went on vacation and then fell off plan and is trying to get back on plan since being back.   Pharmacotherapy for weight loss: She is currently taking  Semaglutide  for medical weight loss.  Denies side effects.    Review of Systems:  Pertinent positives were addressed with patient today.   Reviewed by clinician on day of visit: allergies, medications, problem list, medical history, surgical history, family history, social history, and previous encounter notes.  Weight Summary and Biometrics   Weight Lost Since Last Visit: 0lb  Weight Gained Since Last Visit: 0lb   Vitals Temp: 98.2 F (36.8 C) BP: 121/84 Pulse Rate: 89 SpO2: 99 %   Anthropometric Measurements Height: 5\' 7"  (1.702 m) Weight: 243 lb (110.2 kg) BMI (Calculated): 38.05 Weight at Last Visit: 243lb Weight Lost Since Last Visit: 0lb Weight Gained Since Last Visit: 0lb Starting Weight: 254lb Total Weight Loss (lbs): 21 lb (9.526 kg) Peak Weight: 254lb   Body Composition  Body Fat %:  45.3 % Fat Mass (lbs): 110 lbs Muscle Mass (lbs): 126.2 lbs Total Body Water (lbs): 86.2 lbs Visceral Fat Rating : 13   Other Clinical Data Fasting: no Labs: no Today's Visit #: 21 Starting Date: 09/10/21     Objective:   PHYSICAL EXAM: Blood pressure 121/84, pulse 89, temperature 98.2 F (36.8 C), height 5\' 7"  (1.702 m), weight 243 lb (110.2 kg), last menstrual period 12/28/2011, SpO2 99%. Body mass index is 38.06 kg/m.  General: Well Developed, well nourished, and in no acute distress.  HEENT: Normocephalic, atraumatic Skin: Warm and dry, cap RF less 2 sec, good turgor Chest:  Normal excursion, shape, no gross abn Respiratory: speaking in full sentences, no conversational dyspnea NeuroM-Sk: Ambulates w/o assistance, moves * 4 Psych: A and O *3, insight good, mood-full  DIAGNOSTIC DATA REVIEWED:  BMET    Component Value Date/Time   NA 141 07/26/2022 0806   K 4.8 07/26/2022 0806   CL 99 07/26/2022 0806   CO2 26 07/26/2022 0806   GLUCOSE 95  07/26/2022 0806   GLUCOSE 86 02/26/2022 1004   BUN 13 07/26/2022 0806   CREATININE 0.87 07/26/2022 0806   CALCIUM 10.2 07/26/2022 0806   GFRNONAA 103.03 01/16/2010 1107   GFRAA 103 12/12/2007 1513   Lab Results  Component Value Date   HGBA1C 5.6 07/26/2022   HGBA1C 6.1 04/01/2017   Lab Results  Component Value Date   INSULIN 14.0 07/26/2022   INSULIN 20.1 09/10/2021   Lab Results  Component Value Date   TSH 1.10 02/26/2022   CBC    Component Value Date/Time   WBC 4.0 02/26/2022 1004   RBC 4.99 02/26/2022 1004   HGB 14.9 02/26/2022 1004   HGB 15.8 09/10/2021 1142   HCT 44.1 02/26/2022 1004   HCT 49.9 (H) 09/10/2021 1142   PLT 411.0 (H) 02/26/2022 1004   PLT 430 09/10/2021 1142   MCV 88.3 02/26/2022 1004   MCV 87 09/10/2021 1142   MCH 27.6 09/10/2021 1142   MCHC 33.9 02/26/2022 1004   RDW 13.8 02/26/2022 1004   RDW 13.4 09/10/2021 1142   Iron Studies No results found for: "IRON", "TIBC", "FERRITIN",  "IRONPCTSAT" Lipid Panel     Component Value Date/Time   CHOL 146 07/26/2022 0806   TRIG 73 07/26/2022 0806   HDL 55 07/26/2022 0806   CHOLHDL 2 04/23/2022 0741   VLDL 13.0 04/23/2022 0741   LDLCALC 77 07/26/2022 0806   Hepatic Function Panel     Component Value Date/Time   PROT 8.1 07/26/2022 0806   ALBUMIN 4.8 07/26/2022 0806   AST 19 07/26/2022 0806   ALT 30 07/26/2022 0806   ALKPHOS 122 (H) 07/26/2022 0806   BILITOT 0.4 07/26/2022 0806   BILIDIR 0.1 01/16/2010 1107      Component Value Date/Time   TSH 1.10 02/26/2022 1004   Nutritional Lab Results  Component Value Date   VD25OH 77.4 04/26/2022   VD25OH 61.4 01/14/2022   VD25OH 21.7 (L) 09/10/2021    Attestations:   I, Clinical biochemist, acting as a Stage manager for [employer], DO., have compiled all relevant documentation for today's office visit on behalf of Ashley Lot, DO, while in the presence of [employer], DO.  I have reviewed the above documentation for accuracy and completeness, and I agree with the above. Ashley Mercado, D.O.  The 21st Century Cures Act was signed into law in 2016 which includes the topic of electronic health records.  This provides immediate access to information in MyChart.  This includes consultation notes, operative notes, office notes, lab results and pathology reports.  If you have any questions about what you read please let us know at your next visit so we can discuss your concerns and take corrective action if need be.  We are right here with you.

## 2023-02-17 ENCOUNTER — Encounter (INDEPENDENT_AMBULATORY_CARE_PROVIDER_SITE_OTHER): Payer: Self-pay

## 2023-02-27 ENCOUNTER — Telehealth: Payer: Self-pay | Admitting: Family Medicine

## 2023-02-27 DIAGNOSIS — I10 Essential (primary) hypertension: Secondary | ICD-10-CM

## 2023-02-27 DIAGNOSIS — E559 Vitamin D deficiency, unspecified: Secondary | ICD-10-CM

## 2023-02-27 DIAGNOSIS — E538 Deficiency of other specified B group vitamins: Secondary | ICD-10-CM

## 2023-02-27 DIAGNOSIS — R7989 Other specified abnormal findings of blood chemistry: Secondary | ICD-10-CM

## 2023-02-27 DIAGNOSIS — E1169 Type 2 diabetes mellitus with other specified complication: Secondary | ICD-10-CM

## 2023-02-27 DIAGNOSIS — E119 Type 2 diabetes mellitus without complications: Secondary | ICD-10-CM

## 2023-02-27 NOTE — Telephone Encounter (Signed)
-----   Message from Alvina Chou sent at 02/17/2023  9:01 AM EDT ----- Regarding: Lab orders for Tuesday, 8.27.24 Patient is scheduled for CPX labs, please order future labs, Thanks , Camelia Eng

## 2023-03-01 ENCOUNTER — Other Ambulatory Visit (INDEPENDENT_AMBULATORY_CARE_PROVIDER_SITE_OTHER): Payer: BC Managed Care – PPO

## 2023-03-01 DIAGNOSIS — I10 Essential (primary) hypertension: Secondary | ICD-10-CM

## 2023-03-01 DIAGNOSIS — E559 Vitamin D deficiency, unspecified: Secondary | ICD-10-CM | POA: Diagnosis not present

## 2023-03-01 DIAGNOSIS — E1169 Type 2 diabetes mellitus with other specified complication: Secondary | ICD-10-CM | POA: Diagnosis not present

## 2023-03-01 DIAGNOSIS — R7989 Other specified abnormal findings of blood chemistry: Secondary | ICD-10-CM

## 2023-03-01 DIAGNOSIS — E785 Hyperlipidemia, unspecified: Secondary | ICD-10-CM

## 2023-03-01 DIAGNOSIS — E538 Deficiency of other specified B group vitamins: Secondary | ICD-10-CM

## 2023-03-01 DIAGNOSIS — E119 Type 2 diabetes mellitus without complications: Secondary | ICD-10-CM | POA: Diagnosis not present

## 2023-03-01 LAB — CBC WITH DIFFERENTIAL/PLATELET
Basophils Absolute: 0 10*3/uL (ref 0.0–0.1)
Basophils Relative: 0.8 % (ref 0.0–3.0)
Eosinophils Absolute: 0 10*3/uL (ref 0.0–0.7)
Eosinophils Relative: 0.5 % (ref 0.0–5.0)
HCT: 47 % — ABNORMAL HIGH (ref 36.0–46.0)
Hemoglobin: 15.2 g/dL — ABNORMAL HIGH (ref 12.0–15.0)
Lymphocytes Relative: 25.2 % (ref 12.0–46.0)
Lymphs Abs: 1.5 10*3/uL (ref 0.7–4.0)
MCHC: 32.3 g/dL (ref 30.0–36.0)
MCV: 90.7 fl (ref 78.0–100.0)
Monocytes Absolute: 0.5 10*3/uL (ref 0.1–1.0)
Monocytes Relative: 8 % (ref 3.0–12.0)
Neutro Abs: 3.9 10*3/uL (ref 1.4–7.7)
Neutrophils Relative %: 65.5 % (ref 43.0–77.0)
Platelets: 449 10*3/uL — ABNORMAL HIGH (ref 150.0–400.0)
RBC: 5.19 Mil/uL — ABNORMAL HIGH (ref 3.87–5.11)
RDW: 13.6 % (ref 11.5–15.5)
WBC: 5.9 10*3/uL (ref 4.0–10.5)

## 2023-03-01 LAB — LIPID PANEL
Cholesterol: 139 mg/dL (ref 0–200)
HDL: 53.1 mg/dL (ref >39.00)
LDL Cholesterol: 69 mg/dL (ref 0–99)
NonHDL: 86.35
Total CHOL/HDL Ratio: 3
Triglycerides: 88 mg/dL (ref 0.0–149.0)
VLDL: 17.6 mg/dL (ref 0.0–40.0)

## 2023-03-01 LAB — COMPREHENSIVE METABOLIC PANEL
ALT: 31 U/L (ref 0–35)
AST: 24 U/L (ref 0–37)
Albumin: 4.3 g/dL (ref 3.5–5.2)
Alkaline Phosphatase: 106 U/L (ref 39–117)
BUN: 18 mg/dL (ref 6–23)
CO2: 31 mEq/L (ref 19–32)
Calcium: 9.9 mg/dL (ref 8.4–10.5)
Chloride: 99 mEq/L (ref 96–112)
Creatinine, Ser: 0.94 mg/dL (ref 0.40–1.20)
GFR: 68.88 mL/min (ref >60.00)
Glucose, Bld: 92 mg/dL (ref 70–99)
Potassium: 4.5 mEq/L (ref 3.5–5.1)
Sodium: 139 mEq/L (ref 135–145)
Total Bilirubin: 0.7 mg/dL (ref 0.2–1.2)
Total Protein: 7.9 g/dL (ref 6.0–8.3)

## 2023-03-01 LAB — MICROALBUMIN / CREATININE URINE RATIO
Creatinine,U: 147.8 mg/dL
Microalb Creat Ratio: 0.5 mg/g (ref 0.0–30.0)
Microalb, Ur: <0.7 mg/dL (ref 0.0–1.9)

## 2023-03-01 LAB — VITAMIN D 25 HYDROXY (VIT D DEFICIENCY, FRACTURES): VITD: 46.32 ng/mL (ref 30.00–100.00)

## 2023-03-01 LAB — HEMOGLOBIN A1C: Hgb A1c MFr Bld: 5.9 % (ref 4.6–6.5)

## 2023-03-01 LAB — TSH: TSH: 1.39 u[IU]/mL (ref 0.35–5.50)

## 2023-03-01 LAB — VITAMIN B12: Vitamin B-12: 314 pg/mL (ref 211–911)

## 2023-03-08 ENCOUNTER — Encounter: Payer: Self-pay | Admitting: Family Medicine

## 2023-03-08 ENCOUNTER — Ambulatory Visit (INDEPENDENT_AMBULATORY_CARE_PROVIDER_SITE_OTHER): Payer: BC Managed Care – PPO | Admitting: Family Medicine

## 2023-03-08 VITALS — BP 124/78 | HR 90 | Temp 97.8°F | Ht 66.75 in | Wt 246.4 lb

## 2023-03-08 DIAGNOSIS — Z23 Encounter for immunization: Secondary | ICD-10-CM

## 2023-03-08 DIAGNOSIS — I10 Essential (primary) hypertension: Secondary | ICD-10-CM

## 2023-03-08 DIAGNOSIS — Z7985 Long-term (current) use of injectable non-insulin antidiabetic drugs: Secondary | ICD-10-CM | POA: Insufficient documentation

## 2023-03-08 DIAGNOSIS — E785 Hyperlipidemia, unspecified: Secondary | ICD-10-CM

## 2023-03-08 DIAGNOSIS — E538 Deficiency of other specified B group vitamins: Secondary | ICD-10-CM

## 2023-03-08 DIAGNOSIS — E1169 Type 2 diabetes mellitus with other specified complication: Secondary | ICD-10-CM

## 2023-03-08 DIAGNOSIS — E119 Type 2 diabetes mellitus without complications: Secondary | ICD-10-CM | POA: Diagnosis not present

## 2023-03-08 DIAGNOSIS — R7989 Other specified abnormal findings of blood chemistry: Secondary | ICD-10-CM

## 2023-03-08 DIAGNOSIS — Z1211 Encounter for screening for malignant neoplasm of colon: Secondary | ICD-10-CM

## 2023-03-08 DIAGNOSIS — Z0000 Encounter for general adult medical examination without abnormal findings: Secondary | ICD-10-CM | POA: Diagnosis not present

## 2023-03-08 DIAGNOSIS — E559 Vitamin D deficiency, unspecified: Secondary | ICD-10-CM

## 2023-03-08 NOTE — Progress Notes (Signed)
Subjective:    Patient ID: Ashley Mercado, female    DOB: 1971/01/18, 52 y.o.   MRN: [id number]  HPI  Here for health maintenance exam and to review chronic medical problems   Wt Readings from Last 3 Encounters:  03/08/23 246 lb 6 oz (111.8 kg)  02/10/23 243 lb (110.2 kg)  12/13/22 243 lb (110.2 kg)   38.88 kg/m  Vitals:   03/08/23 1513  BP: 124/78  Pulse: 90  Temp: 97.8 F (36.6 C)  SpO2: 95%    Immunization History  Administered Date(s) Administered   Influenza Whole 04/04/1998   Influenza, High Dose Seasonal PF 06/26/2020, 06/30/2021   Influenza, Seasonal, Injecte, Preservative Fre 03/08/2023   Influenza,inj,Quad PF,6+ Mos 03/28/2014, 03/11/2015, 04/06/2017, 04/12/2018, 04/17/2019, 03/03/2021, 03/05/2022   Influenza-Unspecified 03/19/2016, 03/28/2020   Moderna Covid Bivalent Peds Booster(51mo Thru 45yrs) 05/07/2021, 12/27/2021   Moderna Sars-Covid-2 Vaccination 07/12/2019, 08/09/2019, 05/01/2020, 10/05/2020, 12/27/2021   Td 07/05/1992, 12/05/2006   Tdap 04/06/2017   Zoster Recombinant(Shingrix) 01/27/2020, 04/18/2020    Health Maintenance Due  Topic Date Due   FOOT EXAM  Never done   Hepatitis C Screening  Never done   Feeling good overall  Fair self care    Flu shot - today   Mammogram 07/2022  Self breast exam- no lumps   Eye exam normal 12/2022    Gyn health Pap 04/2019 negative  No gyn issues  No periods     Colon cancer screening -colonoscopy 06/2022  normal- 5 y recall due to previous polyps   Bone health   Falls- none  Fractures-none  Supplements ca and D  Last vitamin D Lab Results  Component Value Date   VD25OH 46.32 03/01/2023    Exercise : [employer] bike and program  Extra walking  Squats [employer] /miles challenge     Mood    03/08/2023    3:16 PM 09/17/2022    1:57 PM 08/25/2022    8:12 AM 09/10/2021    7:37 AM 03/03/2021    3:45 PM  Depression screen PHQ 2/9  Decreased Interest 0 1 0 3 1  Down, Depressed, Hopeless 0 0 0 1 0   PHQ - 2 Score 0 1 0 4 1  Altered sleeping 2 1 1 3 2   Tired, decreased energy 2 1 1 3 2   Change in appetite 0 1 0 2 1  Feeling bad or failure about yourself  0 0 0 1 0  Trouble concentrating 0 0 1 2 1   Moving slowly or fidgety/restless 0 0 0 2 0  Suicidal thoughts 0 0 0 0 0  PHQ-9 Score 4 4 3 17 7   Difficult doing work/chores -- Not difficult at all Not difficult at all Very difficult Not difficult at all   Some fatigue     HTN bp is stable today  No cp or palpitations or headaches or edema  No side effects to medicines  BP Readings from Last 3 Encounters:  03/08/23 124/78  02/10/23 121/84  12/13/22 108/75    Spironolactone 50 mg daily   Pulse Readings from Last 3 Encounters:  03/08/23 90  02/10/23 89  12/13/22 95   Goes to healthy weight and wellness    DM2 Lab Results  Component Value Date   HGBA1C 5.9 03/01/2023   Has been well controlled On semaglutide [facility]  1 mg weekly   Lab Results  Component Value Date   MICROALBUR <0.7 03/01/2023   Eye exam up to date  Hyperlipidemia Lab Results  Component Value Date   CHOL 139 03/01/2023   CHOL 146 07/26/2022   CHOL 117 04/23/2022   Lab Results  Component Value Date   HDL 53.10 03/01/2023   HDL 55 07/26/2022   HDL 50.80 04/23/2022   Lab Results  Component Value Date   LDLCALC 69 03/01/2023   LDLCALC 77 07/26/2022   LDLCALC 53 04/23/2022   Lab Results  Component Value Date   TRIG 88.0 03/01/2023   TRIG 73 07/26/2022   TRIG 65.0 04/23/2022   Lab Results  Component Value Date   CHOLHDL 3 03/01/2023   CHOLHDL 2 04/23/2022   CHOLHDL 4 02/26/2022   No results found for: "LDLDIRECT" Taking crestor 10 mg daily   Past elevated platelets Lab Results  Component Value Date   WBC 5.9 03/01/2023   HGB 15.2 (H) 03/01/2023   HCT 47.0 (H) 03/01/2023   MCV 90.7 03/01/2023   PLT 449.0 (H) 03/01/2023   Up from 411  No bleeding or bruising or clotting      B12 def Lab  Results  Component Value Date   VITAMINB12 314 03/01/2023      Patient Active Problem List   Diagnosis Date Noted   Long-term current use of injectable noninsulin antidiabetic medication 03/08/2023   Degenerative disc disease, cervical 09/20/2022   History of colonic polyps 06/04/2022   DM type 2 with diabetic mixed hyperlipidemia (HCC) 03/08/2022   Vitamin D deficiency 03/05/2022   Low serum vitamin B12 03/05/2022   Hyperlipidemia associated with type 2 diabetes mellitus (HCC) 03/05/2022   Uterine fibroid 07/01/2020   Pelvic joint pain, left 06/24/2020   Colon cancer screening 04/17/2019   DM (diabetes mellitus), type 2 (HCC) 04/06/2017   Carpal tunnel syndrome 11/10/2016   Allergic rhinitis 11/10/2016   Encounter for routine gynecological examination 03/10/2016   Screening for HIV (human immunodeficiency virus) 03/07/2015   Essential hypertension 10/28/2014   Morbid obesity (HCC) 10/20/2011   Routine general medical examination at a health care facility 02/06/2011   Depression 11/04/2010   HIDRADENITIS SUPPURATIVA 05/12/2010   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   Elevated platelet count 11/30/2006   KELOID SCAR 11/29/2006   Past Medical History:  Diagnosis Date   Allergies    Allergy    Bilateral swelling of feet    DM type 2 with diabetic mixed hyperlipidemia (HCC) 03/08/2022   Folliculitis    GERD (gastroesophageal reflux disease)    High blood pressure    High cholesterol    Hip pain    HSV infection    Hydradenitis    Hypertension    Keloid scar    Lactose intolerance    Menorrhagia    Multiple food allergies    Prediabetes    Tired    Past Surgical History:  Procedure Laterality Date   COLONOSCOPY WITH PROPOFOL N/A 06/04/2019   Procedure: COLONOSCOPY WITH PROPOFOL;  Surgeon: Wyline Mood, MD;  Location: [facility] ENDOSCOPY;  Service: Gastroenterology;  Laterality: N/A;   COLONOSCOPY WITH PROPOFOL N/A 06/04/2022   Procedure: COLONOSCOPY WITH PROPOFOL;  Surgeon: Wyline Mood, MD;   Location: [facility] ENDOSCOPY;  Service: Gastroenterology;  Laterality: N/A;   FOOT SURGERY Right    Ligment   Social History   Tobacco Use   Smoking status: Never   Smokeless tobacco: Never  Vaping Use   Vaping status: Never Used  Substance Use Topics   Alcohol use: Yes    Alcohol/week: 1.0 standard drink of alcohol    Types: 1 Glasses  of wine per week    Comment: Occasionally   Drug use: No   Family History  Problem Relation Age of Onset   Hypertension Mother    Diabetes Mother    Sleep apnea Mother    Obesity Mother    Arthritis Mother    Hypertension Father    Diabetes Father    Heart disease Father    Kidney disease Father    Cancer Maternal Grandmother        ovarian   Cancer Maternal Uncle        lymphoma   Allergies  Allergen Reactions   Dust Mite Extract Cough and Itching   Fish Allergy Hives    *CATFISH*   Other     artificial sweeteners   Shrimp Extract Itching and Swelling   Sulfa Antibiotics Rash   Current Outpatient Medications on File Prior to Visit  Medication Sig Dispense Refill   albuterol (VENTOLIN HFA) 108 (90 Base) MCG/ACT inhaler Inhale 2 puffs into the lungs every 6 (six) hours as needed.     Azelastine HCl 137 MCG/SPRAY SOLN Place into both nostrils.     diclofenac (VOLTAREN) 75 MG EC tablet Take 1 tablet (75 mg total) by mouth 2 (two) times daily. 60 tablet 2   EPINEPHrine 0.3 mg/0.3 mL IJ SOAJ injection See admin instructions.     fluticasone (FLONASE) 50 MCG/ACT nasal spray Place into both nostrils.     Insulin Pen Needle (BD PEN NEEDLE NANO 2ND GEN) 32G X 4 MM MISC 1 Package by Does not apply route 2 (two) times daily. 100 each 0   levocetirizine (XYZAL) 5 MG tablet Take 2.5 mg by mouth.      methocarbamol (ROBAXIN) 500 MG tablet Take 1 tablet (500 mg total) by mouth every 8 (eight) hours as needed for muscle spasms. Caution of sedation 30 tablet 1   montelukast (SINGULAIR) 10 MG tablet      Multiple Vitamins-Minerals (MULTIVITAMIN  GUMMIES ADULT PO) Take 2 each by mouth daily.     rosuvastatin (CRESTOR) 10 MG tablet TAKE ONE TABLET BY MOUTH ONE TIME DAILY 90 tablet 3   Semaglutide, 1 MG/DOSE, 4 MG/3ML SOPN Inject 1 mg as directed once a week. 3 mL 0   spironolactone (ALDACTONE) 50 MG tablet TAKE ONE TABLET BY MOUTH ONE TIME DAILY 90 tablet 3   No current facility-administered medications on file prior to visit.    Review of Systems  Constitutional:  Positive for fatigue. Negative for activity change, appetite change, fever and unexpected weight change.  HENT:  Negative for congestion, rhinorrhea, sore throat and trouble swallowing.   Eyes:  Negative for pain, redness, itching and visual disturbance.  Respiratory:  Negative for cough, chest tightness, shortness of breath and wheezing.   Cardiovascular:  Negative for chest pain and palpitations.  Gastrointestinal:  Negative for abdominal pain, blood in stool, constipation, diarrhea and nausea.  Endocrine: Negative for cold intolerance, heat intolerance, polydipsia and polyuria.  Genitourinary:  Negative for difficulty urinating, dysuria, frequency and urgency.  Musculoskeletal:  Negative for arthralgias, joint swelling and myalgias.  Skin:  Negative for pallor and rash.  Neurological:  Negative for dizziness, tremors, weakness, numbness and headaches.  Hematological:  Negative for adenopathy. Does not bruise/bleed easily.  Psychiatric/Behavioral:  Negative for decreased concentration and dysphoric mood. The patient is not nervous/anxious.        Objective:   Physical Exam Constitutional:      General: She is not in acute distress.  Appearance: Normal appearance. She is well-developed. She is obese. She is not ill-appearing or diaphoretic.  HENT:     Head: Normocephalic and atraumatic.     Right Ear: Tympanic membrane, ear canal and external ear normal.     Left Ear: Tympanic membrane, ear canal and external ear normal.     Nose: Nose normal. No congestion.      Mouth/Throat:     Mouth: Mucous membranes are moist.     Pharynx: Oropharynx is clear. No posterior oropharyngeal erythema.  Eyes:     General: No scleral icterus.    Extraocular Movements: Extraocular movements intact.     Conjunctiva/sclera: Conjunctivae normal.     Pupils: Pupils are equal, round, and reactive to light.  Neck:     Thyroid: No thyromegaly.     Vascular: No carotid bruit or JVD.  Cardiovascular:     Rate and Rhythm: Normal rate and regular rhythm.     Pulses: Normal pulses.     Heart sounds: Normal heart sounds.     No gallop.  Pulmonary:     Effort: Pulmonary effort is normal. No respiratory distress.     Breath sounds: Normal breath sounds. No wheezing.     Comments: Good air exch Chest:     Chest wall: No tenderness.  Abdominal:     General: Bowel sounds are normal. There is no distension or abdominal bruit.     Palpations: Abdomen is soft. There is no mass.     Tenderness: There is no abdominal tenderness.     Hernia: No hernia is present.  Genitourinary:    Comments: Breast exam: No mass, nodules, thickening, tenderness, bulging, retraction, inflamation, nipple discharge or skin changes noted.  No axillary or clavicular LA.     Musculoskeletal:        General: No tenderness. Normal range of motion.     Cervical back: Normal range of motion and neck supple. No rigidity. No muscular tenderness.     Right lower leg: No edema.     Left lower leg: No edema.     Comments: No kyphosis   Lymphadenopathy:     Cervical: No cervical adenopathy.  Skin:    General: Skin is warm and dry.     Coloration: Skin is not pale.     Findings: No erythema or rash.  Neurological:     Mental Status: She is alert. Mental status is at baseline.     Cranial Nerves: No cranial nerve deficit.     Motor: No abnormal muscle tone.     Coordination: Coordination normal.     Gait: Gait normal.     Deep Tendon Reflexes: Reflexes are normal and symmetric. Reflexes normal.   Psychiatric:        Mood and Affect: Mood normal.        Cognition and Memory: Cognition and memory normal.           Assessment & Plan:   Problem List Items Addressed This Visit       Cardiovascular and Mediastinum   Essential hypertension    bp in fair control at this time  BP Readings from Last 1 Encounters:  03/08/23 124/78   No changes needed Most recent labs reviewed  Disc lifstyle change with low sodium diet and exercise  Plan to continue spironolactone 50 mg daily        Endocrine   DM (diabetes mellitus), type 2 (HCC)    Lab Results  Component Value  Date   HGBA1C 5.9 03/01/2023   Well controlled with semaglutide 1 mg weekly  [facility]  May end up going up on this  Encouraged low glycemic diet and exercise  Microalb utd  Eye exam utd  Taking statin       Hyperlipidemia associated with type 2 diabetes mellitus (HCC)    Disc goals for lipids and reasons to control them Rev last labs with pt Rev low sat fat diet in detail Taking crestor 10 mg daily  LDL of 69   at goal        Other   Colon cancer screening    Colonoscopy 06/2022 with 5 y recall for polyps previously       Elevated platelet count    Hb 15.2 Plt 449  Check iron and ferritin today   In past- path review noted likely reactive       Relevant Orders   Iron   Ferritin   Long-term current use of injectable noninsulin antidiabetic medication    Lab Results  Component Value Date   HGBA1C 5.9 03/01/2023   [facility]ing well [facility]        Low serum vitamin B12    Lab Results  Component Value Date   VITAMINB12 314 03/01/2023    Continue current supplementation       Morbid obesity (HCC)    Discussed how this problem influences overall health and the risks it imposes  Reviewed plan for weight loss with lower calorie diet (via better food choices (lower glycemic and portion control) along with exercise building up to  or more than 30 minutes 5 days per week including some aerobic activity and strength training   [facility]ues [facility]       Routine general medical examination at a health care facility - Primary    Reviewed health habits including diet and exercise and skin cancer prevention Reviewed appropriate screening tests for age  Also reviewed health mt list, fam hx and immunization status , as well as social and family history   See HPI Labs reviewed and ordered Flu shot given  Mammogram utd 07/2022 Eye exam utd  Colonoscopy 06/2022 wit h5 y recall  Discussed bone health and vit D and exercise  PHQ 4 (per pt due to fatigue- mood is good)        Vitamin D deficiency    Last vitamin D Lab Results  Component Value Date   VD25OH 46.32 03/01/2023   Encouraged her to get back on her regular supplement        Other Visit Diagnoses     Need for influenza vaccination       Relevant Orders   Flu vaccine trivalent PF, 6mos and older(Flulaval,Afluria,Fluarix,Fluzone) (Completed)

## 2023-03-08 NOTE — Assessment & Plan Note (Signed)
Hb 15.2 Plt 449  Check iron and ferritin today   In past- path review noted likely reactive

## 2023-03-08 NOTE — Assessment & Plan Note (Signed)
Lab Results  Component Value Date   HGBA1C 5.9 03/01/2023   Doing well with [facility]

## 2023-03-08 NOTE — Assessment & Plan Note (Signed)
Reviewed health habits including diet and exercise and skin cancer prevention Reviewed appropriate screening tests for age  Also reviewed health mt list, fam hx and immunization status , as well as social and family history   See HPI Labs reviewed and ordered Flu shot given  Mammogram utd 07/2022 Eye exam utd  Colonoscopy 06/2022 wit h5 y recall  Discussed bone health and vit D and exercise  PHQ 4 (per pt due to fatigue- mood is good)

## 2023-03-08 NOTE — Assessment & Plan Note (Signed)
Disc goals for lipids and reasons to control them Rev last labs with pt Rev low sat fat diet in detail Taking crestor 10 mg daily  LDL of 69   at goal

## 2023-03-08 NOTE — Assessment & Plan Note (Signed)
Lab Results  Component Value Date   HGBA1C 5.9 03/01/2023   Well controlled with semaglutide 1 mg weekly  [facility]  May end up going up on this  Encouraged low glycemic diet and exercise  Microalb utd  Eye exam utd  Taking statin

## 2023-03-08 NOTE — Assessment & Plan Note (Signed)
Last vitamin D Lab Results  Component Value Date   VD25OH 46.32 03/01/2023   Encouraged her to get back on her regular supplement

## 2023-03-08 NOTE — Assessment & Plan Note (Signed)
bp in fair control at this time  BP Readings from Last 1 Encounters:  03/08/23 124/78   No changes needed Most recent labs reviewed  Disc lifstyle change with low sodium diet and exercise  Plan to continue spironolactone 50 mg daily

## 2023-03-08 NOTE — Assessment & Plan Note (Signed)
Lab Results  Component Value Date   VITAMINB12 314 03/01/2023    Continue current supplementation

## 2023-03-08 NOTE — Assessment & Plan Note (Signed)
Colonoscopy 06/2022 with 5 y recall for polyps previously

## 2023-03-08 NOTE — Patient Instructions (Addendum)
Get back on your usually vitamin D for winter    Keep doing cardio  Add some strength training to your routine, this is important for bone and brain health and can reduce your risk of falls and help your body use insulin properly and regulate weight  Light weights, exercise bands , and internet videos are a good way to start  Yoga (chair or regular), machines , floor exercises or a gym with machines are also good options   Flu shot today   Labs for iron today   Take care of yourself   Try to get most of your carbohydrates from produce (with the exception of white potatoes) and whole grains Eat less bread/pasta/rice/snack foods/cereals/sweets and other items from the middle of the grocery store (processed carbs)

## 2023-03-08 NOTE — Assessment & Plan Note (Signed)
Discussed how this problem influences overall health and the risks it imposes  Reviewed plan for weight loss with lower calorie diet (via better food choices (lower glycemic and portion control) along with exercise building up to or more than 30 minutes 5 days per week including some aerobic activity and strength training   Continues [facility] to [facility]

## 2023-03-09 LAB — IRON: Iron: 57 ug/dL (ref 42–145)

## 2023-03-09 LAB — FERRITIN: Ferritin: 116.3 ng/mL (ref 10.0–291.0)

## 2023-03-09 NOTE — Addendum Note (Signed)
Addended by: Roxy Manns A on: 03/09/2023 08:11 PM   Modules accepted: Orders

## 2023-03-15 ENCOUNTER — Encounter (INDEPENDENT_AMBULATORY_CARE_PROVIDER_SITE_OTHER): Payer: Self-pay | Admitting: Physician Assistant

## 2023-03-15 ENCOUNTER — Ambulatory Visit (INDEPENDENT_AMBULATORY_CARE_PROVIDER_SITE_OTHER): Payer: BC Managed Care – PPO | Admitting: Physician Assistant

## 2023-03-15 VITALS — BP 110/79 | HR 89 | Temp 98.2°F | Ht 67.0 in | Wt 242.0 lb

## 2023-03-15 DIAGNOSIS — Z6838 Body mass index (BMI) 38.0-38.9, adult: Secondary | ICD-10-CM

## 2023-03-15 DIAGNOSIS — Z7985 Long-term (current) use of injectable non-insulin antidiabetic drugs: Secondary | ICD-10-CM | POA: Diagnosis not present

## 2023-03-15 DIAGNOSIS — E1165 Type 2 diabetes mellitus with hyperglycemia: Secondary | ICD-10-CM

## 2023-03-15 DIAGNOSIS — E669 Obesity, unspecified: Secondary | ICD-10-CM | POA: Insufficient documentation

## 2023-03-15 MED ORDER — OZEMPIC (2 MG/DOSE) 8 MG/3ML ~~LOC~~ SOPN
2.0000 mg | PEN_INJECTOR | SUBCUTANEOUS | 0 refills | Status: DC
Start: 2023-03-15 — End: 2023-04-12

## 2023-03-15 NOTE — Progress Notes (Signed)
.smr  Office: [phone]  /  Fax: [phone]  WEIGHT SUMMARY AND BIOMETRICS  Vitals Temp: 98.2 F (36.8 C) BP: 110/79 Pulse Rate: 89 SpO2: 98 %   Anthropometric Measurements Height: 5\' 7"  (1.702 m) Weight: 242 lb (109.8 kg) BMI (Calculated): 37.89 Weight at Last Visit: 243 lb Weight Lost Since Last Visit: 1 lb Weight Gained Since Last Visit: 0 Starting Weight: 254 lb Total Weight Loss (lbs): 12 lb (5.443 kg) Peak Weight: 254 lb   Body Composition  Body Fat %: 44.5 % Fat Mass (lbs): 107.8 lbs Muscle Mass (lbs): 127.8 lbs Total Body Water (lbs): 85.8 lbs Visceral Fat Rating : 13   Other Clinical Data Fasting: yes Labs: no Today's Visit #: 22 Starting Date: 09/10/21     HPI  Chief Complaint: OBESITY  Ricardo is here to discuss her progress with her obesity treatment plan. She is on the the Category 2 Plan and states she is following her eating plan approximately 50 % of the time. She states she is exercising strengthing/ walking 10K steps at work 15 minutes 4/5 times per week.  Discussed the use of AI scribe software for clinical note transcription with the patient, who gave verbal consent to proceed.  History of Present Illness  / Interval History:  Since last office visit she down 1 lb.    Zamyiah Kiener, a 52 year old female with a history of obesity, type 2 diabetes, vitamin D deficiency, and hyperlipidemia, presents for a follow-up of her obesity treatment plan. She reports struggling with sweet cravings, which she usually satisfies with cookies or cake. Despite these cravings, she has managed to lose some weight on her current dose of semaglutide. She checks her blood sugars regularly and they are typically less than 100. She has a sedentary job but manages to get in about 10,000 steps a day. She is also doing 72 squats a day as part of a challenge at her workplace.   Pharmacotherapy: Ozempic 1 mg weekly. Denies mass in neck, dysphagia, dyspepsia,  persistent hoarseness, abdominal pain, or N/V/Constipation or diarrhea. Has annual eye exam. Mood is stable.    TREATMENT PLAN FOR OBESITY: Obesity Despite sweet cravings and occasional indulgence, patient has lost weight and built muscle mass. Currently on 1mg  Semaglutide with room to increase dose for better appetite suppression. -Increase Semaglutide to 2mg  for improved appetite suppression. -Encourage mindful eating and logging food intake for better awareness and control. Recommended Dietary Goals  Abbye is currently in the action stage of change. As such, her goal is to continue weight management plan. She has agreed to the Category 2 Plan.  Behavioral Intervention  We discussed the following Behavioral Modification Strategies today: increasing lean protein intake, decreasing simple carbohydrates , increasing vegetables, increasing lower glycemic fruits, avoiding skipping meals, increasing water intake, work on tracking and journaling calories using tracking application, continue to practice mindfulness when eating, planning for success, and staying on track while traveling and vacationing.  Additional resources provided today: NA  Recommended Physical Activity Goals  Blayre has been advised to work up to 150 minutes of moderate intensity aerobic activity a week and strengthening exercises 2-3 times per week for cardiovascular health, weight loss maintenance and preservation of muscle mass.   She has agreed to Continue current level of physical activity    Pharmacotherapy We discussed various medication options to help Sissi with her weight loss efforts and we both agreed to increase Ozempic to 2 mg weekly for Type 2 diabetes management.  Return in about 4 weeks (around 04/12/2023).Marland Kitchen She was informed of the importance of frequent follow up visits to maximize her success with intensive lifestyle modifications for her multiple health conditions.  PHYSICAL EXAM:  Blood  pressure 110/79, pulse 89, temperature 98.2 F (36.8 C), height 5\' 7"  (1.702 m), weight 242 lb (109.8 kg), last menstrual period 12/28/2011, SpO2 98%. Body mass index is 37.9 kg/m.  General: She is overweight, cooperative, alert, well developed, and in no acute distress. PSYCH: Has normal mood, affect and thought process.   Cardiovascular: HR 80's BP 110/79 Lungs: Normal breathing effort, no conversational dyspnea. Neuro: no focal deficits  DIAGNOSTIC DATA REVIEWED:  BMET    Component Value Date/Time   NA 139 03/01/2023 0744   NA 141 07/26/2022 0806   K 4.5 03/01/2023 0744   CL 99 03/01/2023 0744   CO2 31 03/01/2023 0744   GLUCOSE 92 03/01/2023 0744   BUN 18 03/01/2023 0744   BUN 13 07/26/2022 0806   CREATININE 0.94 03/01/2023 0744   CALCIUM 9.9 03/01/2023 0744   GFRNONAA 103.03 01/16/2010 1107   GFRAA 103 12/12/2007 1513   Lab Results  Component Value Date   HGBA1C 5.9 03/01/2023   HGBA1C 6.1 04/01/2017   Lab Results  Component Value Date   INSULIN 14.0 07/26/2022   INSULIN 20.1 09/10/2021   Lab Results  Component Value Date   TSH 1.39 03/01/2023   CBC    Component Value Date/Time   WBC 5.9 03/01/2023 0744   RBC 5.19 (H) 03/01/2023 0744   HGB 15.2 (H) 03/01/2023 0744   HGB 15.8 09/10/2021 1142   HCT 47.0 (H) 03/01/2023 0744   HCT 49.9 (H) 09/10/2021 1142   PLT 449.0 (H) 03/01/2023 0744   PLT 430 09/10/2021 1142   MCV 90.7 03/01/2023 0744   MCV 87 09/10/2021 1142   MCH 27.6 09/10/2021 1142   MCHC 32.3 03/01/2023 0744   RDW 13.6 03/01/2023 0744   RDW 13.4 09/10/2021 1142   Iron Studies    Component Value Date/Time   IRON 57 03/08/2023 1546   FERRITIN 116.3 03/08/2023 1546   Lipid Panel     Component Value Date/Time   CHOL 139 03/01/2023 0744   CHOL 146 07/26/2022 0806   TRIG 88.0 03/01/2023 0744   HDL 53.10 03/01/2023 0744   HDL 55 07/26/2022 0806   CHOLHDL 3 03/01/2023 0744   VLDL 17.6 03/01/2023 0744   LDLCALC 69 03/01/2023 0744   LDLCALC  77 07/26/2022 0806   Hepatic Function Panel     Component Value Date/Time   PROT 7.9 03/01/2023 0744   PROT 8.1 07/26/2022 0806   ALBUMIN 4.3 03/01/2023 0744   ALBUMIN 4.8 07/26/2022 0806   AST 24 03/01/2023 0744   ALT 31 03/01/2023 0744   ALKPHOS 106 03/01/2023 0744   BILITOT 0.7 03/01/2023 0744   BILITOT 0.4 07/26/2022 0806   BILIDIR 0.1 01/16/2010 1107      Component Value Date/Time   TSH 1.39 03/01/2023 0744   Nutritional Lab Results  Component Value Date   VD25OH 46.32 03/01/2023   VD25OH 77.4 04/26/2022   VD25OH 61.4 01/14/2022    ASSOCIATED CONDITIONS ADDRESSED TODAY  ASSESSMENT AND PLAN  Problem List Items Addressed This Visit     DM (diabetes mellitus), type 2 (HCC) - Primary   Relevant Medications   Semaglutide, 2 MG/DOSE, (OZEMPIC, 2 MG/DOSE,) 8 MG/3ML SOPN   Generalized obesity- Start BMI 39.9   Relevant Medications   Semaglutide, 2 MG/DOSE, (OZEMPIC, 2  MG/DOSE,) 8 MG/3ML SOPN  Obesity Despite sweet cravings and occasional indulgence, patient has lost weight and built muscle mass. Currently on 1mg  Semaglutide with room to increase dose for better appetite suppression. -Increase Semaglutide to 2mg  for improved appetite suppression. -Encourage mindful eating and logging food intake for better awareness and control.  Type 2 Diabetes Last A1C was 5.9, indicating good control. Blood sugars typically run less than 100. -Continue current management.  Follow-up in 4 weeks to assess response to increased Semaglutide dose.  Type 2 Diabetes Mellitus with other specified complication, without long-term current use of insulin Labs were reviewed today and discussed with the patient.   HgbA1c is at goal. Last A1c was 5.9 CBGs: Fasting < 100 Episodes of hypoglycemia: no Medication(s): Ozempic 1 mg SQ weekly Denies mass in neck, dysphagia, dyspepsia, persistent hoarseness, abdominal pain, or N/V/Constipation or diarrhea. Has annual eye exam. Mood is stable.   Reports increased sweets cravings lately.  Has been on current dose of semaglutide for ~ 1 year. Agreeable to increase dose at this time.  She is working on nutrition plan to decrease simple carbohydrates, increase lean proteins and exercise to promote weight loss and improve glycemic control .   Lab Results  Component Value Date   HGBA1C 5.9 03/01/2023   HGBA1C 5.6 07/26/2022   HGBA1C 5.5 01/14/2022   Lab Results  Component Value Date   MICROALBUR <0.7 03/01/2023   LDLCALC 69 03/01/2023   CREATININE 0.94 03/01/2023   Lab Results  Component Value Date   GFR 68.88 03/01/2023   GFR 71.18 02/26/2022   GFR 67.30 02/02/2021    Plan: Continue and increase dose Ozempic 2 mg SQ weekly She is working  on nutrition plan to decrease simple carbohydrates, increase lean proteins and exercise to promote weight loss and improve glycemic control . Monitor closely on increased semaglutide over the next month.  Could consider changing to [facility] as well depending on coverage.    ATTESTASTION STATEMENTS:  Reviewed by clinician on day of visit: allergies, medications, problem list, medical history, surgical history, family history, social history, and previous encounter notes.   I have personally spent 33 minutes total time today in preparation, patient care, nutritional counseling and documentation for this visit, including the following: review of clinical lab tests; review of medical tests/procedures/services.      Jammy Stlouis, PA-C

## 2023-04-12 ENCOUNTER — Ambulatory Visit (INDEPENDENT_AMBULATORY_CARE_PROVIDER_SITE_OTHER): Payer: BC Managed Care – PPO | Admitting: Physician Assistant

## 2023-04-12 ENCOUNTER — Encounter (INDEPENDENT_AMBULATORY_CARE_PROVIDER_SITE_OTHER): Payer: Self-pay | Admitting: Physician Assistant

## 2023-04-12 VITALS — BP 115/81 | HR 86 | Temp 97.9°F | Ht 67.0 in | Wt 246.0 lb

## 2023-04-12 DIAGNOSIS — Z6838 Body mass index (BMI) 38.0-38.9, adult: Secondary | ICD-10-CM | POA: Insufficient documentation

## 2023-04-12 DIAGNOSIS — E785 Hyperlipidemia, unspecified: Secondary | ICD-10-CM

## 2023-04-12 DIAGNOSIS — Z7985 Long-term (current) use of injectable non-insulin antidiabetic drugs: Secondary | ICD-10-CM

## 2023-04-12 DIAGNOSIS — E669 Obesity, unspecified: Secondary | ICD-10-CM | POA: Diagnosis not present

## 2023-04-12 DIAGNOSIS — Z6837 Body mass index (BMI) 37.0-37.9, adult: Secondary | ICD-10-CM | POA: Insufficient documentation

## 2023-04-12 DIAGNOSIS — E1165 Type 2 diabetes mellitus with hyperglycemia: Secondary | ICD-10-CM

## 2023-04-12 MED ORDER — OZEMPIC (2 MG/DOSE) 8 MG/3ML ~~LOC~~ SOPN
2.0000 mg | PEN_INJECTOR | SUBCUTANEOUS | 0 refills | Status: DC
Start: 2023-04-12 — End: 2023-05-05

## 2023-04-12 NOTE — Progress Notes (Signed)
.smr  Office: [phone]  /  Fax: [phone]  WEIGHT SUMMARY AND BIOMETRICS  Vitals Temp: 97.9 F (36.6 C) BP: 115/81 Pulse Rate: 86 SpO2: 98 %   Anthropometric Measurements Height: 5\' 7"  (1.702 m) Weight: 246 lb (111.6 kg) BMI (Calculated): 38.52 Weight at Last Visit: 242 lb Weight Lost Since Last Visit: 0 Weight Gained Since Last Visit: 4 lb Starting Weight: 254 lb Total Weight Loss (lbs): 8 lb (3.629 kg) Peak Weight: 254 lb   Body Composition  Body Fat %: 45.6 % Fat Mass (lbs): 112.2 lbs Muscle Mass (lbs): 127 lbs Total Body Water (lbs): 87.2 lbs Visceral Fat Rating : 13   Other Clinical Data Fasting: no Labs: no Today's Visit #: 24 Starting Date: 09/10/21     HPI  Chief Complaint: OBESITY  Ashley Mercado is here to discuss her progress with her obesity treatment plan. She is on the the Category 2 Plan and states she is following her eating plan approximately 50 % of the time. She states she is exercising walking 60 minutes 4 times per week. Discussed the use of AI scribe software for clinical note transcription with the patient, who gave verbal consent to proceed.  History of Present Illness  /     Interval History:  Since last office visit she is up 4 lbs.   The patient is a 52 year old female with a history of type 2 diabetes and asthma, who presents for a follow-up visit regarding her obesity treatment plan. The patient reports a recent weight gain, which she attributes to dietary challenges during a recent conference. She describes the food at the conference as being high in starch and sugar, including items such as barbecue, fries, potatoes, pastries, and cakes. Despite these dietary challenges, the patient has maintained her muscle mass.  The patient checks her blood sugar levels intermittently, reporting that she has been consistently below 100, with no episodes of hypoglycemia. She has not been checking her blood sugar levels daily, but she does monitor  them to ensure they are not excessively high or low.  The patient has been trying to increase her protein intake, both through her diet and by using protein shakes. She reports difficulty finding the shakes in stores. She also notes that she struggles with dietary choices when she is away from home, such as during the conference.  The patient is currently on a 2mg  dose of Ozempic, which she takes in the afternoon to avoid feelings of nausea. She reports that she did not take the medication during the conference due to forgetting it at home. Since returning home, she has noticed a decrease in her snacking habits.   Pharmacotherapy: Ozempic 2 mg weekly- max dose started 03/15/23. Denies mass in neck, dysphagia, dyspepsia, persistent hoarseness, abdominal pain, or N/V/Constipation or diarrhea. Has annual eye exam. Mood is stable.    TREATMENT PLAN FOR OBESITY: Obesity Plateau in weight loss progress, possibly due to recent dietary changes during a conference. Maintained muscle mass. Currently on Ozempic 2mg , recently increased from 1mg . Discussed potential switch to [facility] if no improvement seen in the next few weeks. -Continue Ozempic 2mg . -Encouraged to return to previous dietary habits and increase protein intake to 85g/day. -Check in 1 month to assess progress and consider switch to [facility] if no improvement. Recommended Dietary Goals  Ashley Mercado is currently in the action stage of change. As such, her goal is to continue weight management plan. She has agreed to the Category 2 Plan.  Behavioral Intervention  We  discussed the following Behavioral Modification Strategies today: increasing lean protein intake to established goals, decreasing simple carbohydrates , increasing vegetables, increasing lower glycemic fruits, increasing fiber rich foods, avoiding skipping meals, increasing water intake , work on meal planning and preparation, and continue to work on maintaining a reduced calorie  state, getting the recommended amount of protein, incorporating whole foods, making healthy choices, staying well hydrated and practicing mindfulness when eating..  Additional resources provided today: NA  Recommended Physical Activity Goals  Ashley Mercado has been advised to work up to 150 minutes of moderate intensity aerobic activity a week and strengthening exercises 2-3 times per week for cardiovascular health, weight loss maintenance and preservation of muscle mass.   She has agreed to Continue current level of physical activity  and Increase physical activity in their day and reduce sedentary time (increase NEAT).   Pharmacotherapy We discussed various medication options to help Ashley Mercado with her weight loss efforts and we both agreed to continue Ozempic 2 mg weekly for Type 2 diabetes.    Return in about 4 weeks (around 05/10/2023).Marland Kitchen She was informed of the importance of frequent follow up visits to maximize her success with intensive lifestyle modifications for her multiple health conditions.  PHYSICAL EXAM:  Blood pressure 115/81, pulse 86, temperature 97.9 F (36.6 C), height 5\' 7"  (1.702 m), weight 246 lb (111.6 kg), last menstrual period 12/28/2011, SpO2 98%. Body mass index is 38.53 kg/m.  General: She is overweight, cooperative, alert, well developed, and in no acute distress. PSYCH: Has normal mood, affect and thought process.   Cardiovascular: HR 80's BP 115/81 Lungs: Normal breathing effort, no conversational dyspnea. Neuro: no focal deficits  DIAGNOSTIC DATA REVIEWED:  BMET    Component Value Date/Time   NA 139 03/01/2023 0744   NA 141 07/26/2022 0806   K 4.5 03/01/2023 0744   CL 99 03/01/2023 0744   CO2 31 03/01/2023 0744   GLUCOSE 92 03/01/2023 0744   BUN 18 03/01/2023 0744   BUN 13 07/26/2022 0806   CREATININE 0.94 03/01/2023 0744   CALCIUM 9.9 03/01/2023 0744   GFRNONAA 103.03 01/16/2010 1107   GFRAA 103 12/12/2007 1513   Lab Results  Component Value Date    HGBA1C 5.9 03/01/2023   HGBA1C 6.1 04/01/2017   Lab Results  Component Value Date   INSULIN 14.0 07/26/2022   INSULIN 20.1 09/10/2021   Lab Results  Component Value Date   TSH 1.39 03/01/2023   CBC    Component Value Date/Time   WBC 5.9 03/01/2023 0744   RBC 5.19 (H) 03/01/2023 0744   HGB 15.2 (H) 03/01/2023 0744   HGB 15.8 09/10/2021 1142   HCT 47.0 (H) 03/01/2023 0744   HCT 49.9 (H) 09/10/2021 1142   PLT 449.0 (H) 03/01/2023 0744   PLT 430 09/10/2021 1142   MCV 90.7 03/01/2023 0744   MCV 87 09/10/2021 1142   MCH 27.6 09/10/2021 1142   MCHC 32.3 03/01/2023 0744   RDW 13.6 03/01/2023 0744   RDW 13.4 09/10/2021 1142   Iron Studies    Component Value Date/Time   IRON 57 03/08/2023 1546   FERRITIN 116.3 03/08/2023 1546   Lipid Panel     Component Value Date/Time   CHOL 139 03/01/2023 0744   CHOL 146 07/26/2022 0806   TRIG 88.0 03/01/2023 0744   HDL 53.10 03/01/2023 0744   HDL 55 07/26/2022 0806   CHOLHDL 3 03/01/2023 0744   VLDL 17.6 03/01/2023 0744   LDLCALC 69 03/01/2023 0744  LDLCALC 77 07/26/2022 0806   Hepatic Function Panel     Component Value Date/Time   PROT 7.9 03/01/2023 0744   PROT 8.1 07/26/2022 0806   ALBUMIN 4.3 03/01/2023 0744   ALBUMIN 4.8 07/26/2022 0806   AST 24 03/01/2023 0744   ALT 31 03/01/2023 0744   ALKPHOS 106 03/01/2023 0744   BILITOT 0.7 03/01/2023 0744   BILITOT 0.4 07/26/2022 0806   BILIDIR 0.1 01/16/2010 1107      Component Value Date/Time   TSH 1.39 03/01/2023 0744   Nutritional Lab Results  Component Value Date   VD25OH 46.32 03/01/2023   VD25OH 77.4 04/26/2022   VD25OH 61.4 01/14/2022    ASSOCIATED CONDITIONS ADDRESSED TODAY  ASSESSMENT AND PLAN  Problem List Items Addressed This Visit     DM (diabetes mellitus), type 2 (HCC) - Primary   Relevant Medications   Semaglutide, 2 MG/DOSE, (OZEMPIC, 2 MG/DOSE,) 8 MG/3ML SOPN   Generalized obesity- Start BMI 39.9   Relevant Medications   Semaglutide, 2  MG/DOSE, (OZEMPIC, 2 MG/DOSE,) 8 MG/3ML SOPN   BMI 38.0-38.9,adult Current BMI 38.5    Type 2 Diabetes Mellitus with other specified complication, without long-term current use of insulin HgbA1c is at goal. Last A1c was 5.9 CBGs: Fasting 80-low 100 Episodes of hypoglycemia: no Medication(s): Ozempic 2 mg SQ weekly  Lab Results  Component Value Date   HGBA1C 5.9 03/01/2023   HGBA1C 5.6 07/26/2022   HGBA1C 5.5 01/14/2022   Lab Results  Component Value Date   MICROALBUR <0.7 03/01/2023   LDLCALC 69 03/01/2023   CREATININE 0.94 03/01/2023   Lab Results  Component Value Date   GFR 68.88 03/01/2023   GFR 71.18 02/26/2022   GFR 67.30 02/02/2021    Plan: Continue and refill Ozempic 2 mg SQ weekly Discussed possibly changing to [facility] as little change over the past few months, but just started max dose Ozempic over the past month. Will monitor but if remains at plateau, consider switch to [facility].   Hyperlipidemia LDL is at goal. Medication(s): crestor 10 mg daily. And Ozempic 2 mg weekly. No side effects.  Cardiovascular risk factors: diabetes mellitus, dyslipidemia, family history of premature cardiovascular disease, hypertension, and obesity (BMI >= 30 kg/m2)  Lab Results  Component Value Date   CHOL 139 03/01/2023   HDL 53.10 03/01/2023   LDLCALC 69 03/01/2023   TRIG 88.0 03/01/2023   CHOLHDL 3 03/01/2023   CHOLHDL 2 04/23/2022   CHOLHDL 4 02/26/2022   Lab Results  Component Value Date   ALT 31 03/01/2023   AST 24 03/01/2023   ALKPHOS 106 03/01/2023   BILITOT 0.7 03/01/2023   The 10-year ASCVD risk score (Arnett DK, et al., 2019) is: 5.5%   Values used to calculate the score:     Age: 76 years     Sex: Female     Is Non-Hispanic African American: Yes     Diabetic: Yes     Tobacco smoker: No     Systolic Blood Pressure: 115 mmHg     Is BP treated: Yes     HDL Cholesterol: 53.1 mg/dL     Total Cholesterol: 139 mg/dL  Plan: Continue statin. Continue to  work on nutrition plan -decreasing simple carbohydrates, increasing lean proteins, decreasing saturated fats and cholesterol , avoiding trans fats and exercise as able to promote weight loss, improve lipids and decrease cardiovascular risks. Continue Ozempic to reduce CV risks.      ATTESTASTION STATEMENTS:  Reviewed by clinician on  day of visit: allergies, medications, problem list, medical history, surgical history, family history, social history, and previous encounter notes.   I have personally spent 35 minutes total time today in preparation, patient care, nutritional counseling and documentation for this visit, including the following: review of clinical lab tests; review of medical tests/procedures/services.      Nayelie Gionfriddo, PA-C

## 2023-05-02 ENCOUNTER — Other Ambulatory Visit: Payer: Self-pay | Admitting: Family Medicine

## 2023-05-05 ENCOUNTER — Encounter (INDEPENDENT_AMBULATORY_CARE_PROVIDER_SITE_OTHER): Payer: Self-pay | Admitting: Physician Assistant

## 2023-05-05 ENCOUNTER — Ambulatory Visit (INDEPENDENT_AMBULATORY_CARE_PROVIDER_SITE_OTHER): Payer: BC Managed Care – PPO | Admitting: Physician Assistant

## 2023-05-05 ENCOUNTER — Telehealth (INDEPENDENT_AMBULATORY_CARE_PROVIDER_SITE_OTHER): Payer: Self-pay

## 2023-05-05 VITALS — BP 112/74 | HR 104 | Temp 97.8°F | Ht 67.0 in | Wt 247.0 lb

## 2023-05-05 DIAGNOSIS — E1169 Type 2 diabetes mellitus with other specified complication: Secondary | ICD-10-CM | POA: Diagnosis not present

## 2023-05-05 DIAGNOSIS — E785 Hyperlipidemia, unspecified: Secondary | ICD-10-CM

## 2023-05-05 DIAGNOSIS — E1165 Type 2 diabetes mellitus with hyperglycemia: Secondary | ICD-10-CM

## 2023-05-05 DIAGNOSIS — Z6838 Body mass index (BMI) 38.0-38.9, adult: Secondary | ICD-10-CM

## 2023-05-05 DIAGNOSIS — Z7985 Long-term (current) use of injectable non-insulin antidiabetic drugs: Secondary | ICD-10-CM

## 2023-05-05 DIAGNOSIS — E669 Obesity, unspecified: Secondary | ICD-10-CM

## 2023-05-05 MED ORDER — TIRZEPATIDE 7.5 MG/0.5ML ~~LOC~~ SOAJ
7.5000 mg | SUBCUTANEOUS | 0 refills | Status: DC
Start: 2023-05-05 — End: 2023-05-31

## 2023-05-05 NOTE — Progress Notes (Signed)
.smr  Office: [phone]  /  Fax: [phone]  WEIGHT SUMMARY AND BIOMETRICS  Vitals Temp: 97.8 F (36.6 C) BP: 112/74 Pulse Rate: (!) 104 SpO2: 98 %   Anthropometric Measurements Height: 5\' 7"  (1.702 m) Weight: 247 lb (112 kg) BMI (Calculated): 38.68 Weight at Last Visit: 246 lb Weight Lost Since Last Visit: 0 Weight Gained Since Last Visit: 1 lb Starting Weight: 254 lb Total Weight Loss (lbs): 7 lb (3.175 kg) Peak Weight: 254 lb   Body Composition  Body Fat %: 46.2 % Fat Mass (lbs): 114.2 lbs Muscle Mass (lbs): 126.2 lbs Total Body Water (lbs): 88.8 lbs Visceral Fat Rating : 14   Other Clinical Data Fasting: no Labs: no Today's Visit #: 25 Starting Date: 09/10/21     HPI  Chief Complaint: OBESITY  Ashley Mercado is here to discuss her progress with her obesity treatment plan. She is on the the Category 2 Plan and states she is following her eating plan approximately 50 % of the time. She states she is exercising walking 30-45 minutes 4 times per week.   Interval History:  Since last office visit she is up 1 lb. Bio impedence scale reviewed with the patient:   The patient, with a history of obesity, type two diabetes, and hyperlipidemia, presents for a follow-up visit. She has been on the maximum dose of Ozempic for approximately two months but reports no significant changes in weight.  The patient admits to struggles with portion control and maintaining a healthy diet, particularly during travel and social events.  She reports increased hunger and less appetite suppression.  The patient also mentions a recent convention where she consumed barbecue and snacks, and a birthday party where she anticipates eating cake.  She expresses frustration with the challenges of maintaining a healthy diet in social situations. The patient also discusses her routine of eating at specific times during the day, but notes an increased hunger around 2 PM, often leading to snacking  while preparing dinner. We discussed strategies to have ready in anticipation of afternoon hunger signals with protein based snacks.   Pharmacotherapy:  Ozempic 2 mg weekly- max dose started 03/15/23. Denies mass in neck, dysphagia, dyspepsia, persistent hoarseness, abdominal pain, or N/V/Constipation or diarrhea. Has annual eye exam. Mood is stable.  We discussed switching to [facility] today and discussed possible risks and benefits of GLP/GIP medications.  We have reviewed the risks and benefits of using a GLP-1/GIP. The patient denies a personal or family history of medullary thyroid cancer or MENII. The patient denies a history of pancreatitis. The potential risks and benefits of this GLP-1/GIP were reviewed with the patient, and alternative treatment options were discussed. All questions were answered, and the patient wishes to move forward with switch to [facility].   TREATMENT PLAN FOR OBESITY: Obesity Weight has been creeping up slightly. Patient reports decreased appetite suppression with current medication (Ozempic). Discussed the benefits of switching to [facility] for better appetite control and potential weight loss. Patient does report her mom is taking Mounjaro for Type 2 diabetes and has experienced better weight loss with Mounjaro as well.  -Switch to [employer] 7.5mg  weekly -Continue mindful eating with focus on portion control and protein intake. -Follow-up appointment on 05/31/2023 to assess response to new medication.  Recommended Dietary Goals  Ashley Mercado is currently in the action stage of change. As such, her goal is to continue weight management plan. She has agreed to the Category 2 Plan.  Behavioral Intervention  We discussed the following  Behavioral Modification Strategies today: continue to work on maintaining a reduced calorie state, getting the recommended amount of protein, incorporating whole foods, making healthy choices, staying well hydrated and practicing mindfulness  when eating..  Additional resources provided today: NA  Recommended Physical Activity Goals  Ashley Mercado has been advised to work up to 150 minutes of moderate intensity aerobic activity a week and strengthening exercises 2-3 times per week for cardiovascular health, weight loss maintenance and preservation of muscle mass.   She has agreed to Continue current level of physical activity  and Increase physical activity in their day and reduce sedentary time (increase NEAT).   Pharmacotherapy We discussed various medication options to help Ashley Mercado with her weight loss efforts and we both agreed to change to Mounjaro 7.5 mg weekly for management of Type 2 diabetes and to promote weight loss.    Return in about 4 weeks (around 06/02/2023).Marland Kitchen She was informed of the importance of frequent follow up visits to maximize her success with intensive lifestyle modifications for her multiple health conditions.  PHYSICAL EXAM:  Blood pressure 112/74, pulse (!) 104, temperature 97.8 F (36.6 C), height 5\' 7"  (1.702 m), weight 247 lb (112 kg), last menstrual period 12/28/2011, SpO2 98%. Body mass index is 38.69 kg/m.  General: She is overweight, cooperative, alert, well developed, and in no acute distress. PSYCH: Has normal mood, affect and thought process.   Cardiovascular: HR 104- monitor, BP 112/74 Lungs: Normal breathing effort, no conversational dyspnea. Neuro: no focal deficits  DIAGNOSTIC DATA REVIEWED:  BMET    Component Value Date/Time   NA 139 03/01/2023 0744   NA 141 07/26/2022 0806   K 4.5 03/01/2023 0744   CL 99 03/01/2023 0744   CO2 31 03/01/2023 0744   GLUCOSE 92 03/01/2023 0744   BUN 18 03/01/2023 0744   BUN 13 07/26/2022 0806   CREATININE 0.94 03/01/2023 0744   CALCIUM 9.9 03/01/2023 0744   GFRNONAA 103.03 01/16/2010 1107   GFRAA 103 12/12/2007 1513   Lab Results  Component Value Date   HGBA1C 5.9 03/01/2023   HGBA1C 6.1 04/01/2017   Lab Results  Component Value Date    INSULIN 14.0 07/26/2022   INSULIN 20.1 09/10/2021   Lab Results  Component Value Date   TSH 1.39 03/01/2023   CBC    Component Value Date/Time   WBC 5.9 03/01/2023 0744   RBC 5.19 (H) 03/01/2023 0744   HGB 15.2 (H) 03/01/2023 0744   HGB 15.8 09/10/2021 1142   HCT 47.0 (H) 03/01/2023 0744   HCT 49.9 (H) 09/10/2021 1142   PLT 449.0 (H) 03/01/2023 0744   PLT 430 09/10/2021 1142   MCV 90.7 03/01/2023 0744   MCV 87 09/10/2021 1142   MCH 27.6 09/10/2021 1142   MCHC 32.3 03/01/2023 0744   RDW 13.6 03/01/2023 0744   RDW 13.4 09/10/2021 1142   Iron Studies    Component Value Date/Time   IRON 57 03/08/2023 1546   FERRITIN 116.3 03/08/2023 1546   Lipid Panel     Component Value Date/Time   CHOL 139 03/01/2023 0744   CHOL 146 07/26/2022 0806   TRIG 88.0 03/01/2023 0744   HDL 53.10 03/01/2023 0744   HDL 55 07/26/2022 0806   CHOLHDL 3 03/01/2023 0744   VLDL 17.6 03/01/2023 0744   LDLCALC 69 03/01/2023 0744   LDLCALC 77 07/26/2022 0806   Hepatic Function Panel     Component Value Date/Time   PROT 7.9 03/01/2023 0744   PROT 8.1 07/26/2022 0806  ALBUMIN 4.3 03/01/2023 0744   ALBUMIN 4.8 07/26/2022 0806   AST 24 03/01/2023 0744   ALT 31 03/01/2023 0744   ALKPHOS 106 03/01/2023 0744   BILITOT 0.7 03/01/2023 0744   BILITOT 0.4 07/26/2022 0806   BILIDIR 0.1 01/16/2010 1107      Component Value Date/Time   TSH 1.39 03/01/2023 0744   Nutritional Lab Results  Component Value Date   VD25OH 46.32 03/01/2023   VD25OH 77.4 04/26/2022   VD25OH 61.4 01/14/2022    ASSOCIATED CONDITIONS ADDRESSED TODAY  ASSESSMENT AND PLAN  Problem List Items Addressed This Visit     DM (diabetes mellitus), type 2 (HCC)   Relevant Medications   tirzepatide (MOUNJARO) 7.5 MG/0.5ML Pen    Type 2 Diabetes Mellitus with other specified complication, without long-term current use of insulin HgbA1c is at goal. Last A1c was 5.9  Medication(s): Ozempic 2 mg SQ weekly Denies mass in neck,  dysphagia, dyspepsia, persistent hoarseness, abdominal pain, or N/V/Constipation or diarrhea. Has annual eye exam. Mood is stable.  She is working  on nutrition plan to decrease simple carbohydrates, increase lean proteins and exercise to promote weight loss and improve glycemic control . Limited weight loss and some weight regain on Ozempic.  Reports less appetite suppression and less suppression of cravings lately.  Will switch to [facility] as discussed.    Lab Results  Component Value Date   HGBA1C 5.9 03/01/2023   HGBA1C 5.6 07/26/2022   HGBA1C 5.5 01/14/2022   Lab Results  Component Value Date   MICROALBUR <0.7 03/01/2023   LDLCALC 69 03/01/2023   CREATININE 0.94 03/01/2023   Lab Results  Component Value Date   GFR 68.88 03/01/2023   GFR 71.18 02/26/2022   GFR 67.30 02/02/2021    Plan: Change to Mounjaro 7.5 mg SQ weekly monitor response over the next month and titrate accordingly.  Continue working  on nutrition plan to decrease simple carbohydrates, increase lean proteins and exercise to promote weight loss and improve glycemic control .  Hyperlipidemia associated with DM LDL is at goal. HDL 53- at goal of > 40. Trig 88- at goal.  Medication(s): Crestor 10 mg daily. No side effects Cardiovascular risk factors: diabetes mellitus, dyslipidemia, obesity (BMI >= 30 kg/m2), and sedentary lifestyle  Lab Results  Component Value Date   CHOL 139 03/01/2023   HDL 53.10 03/01/2023   LDLCALC 69 03/01/2023   TRIG 88.0 03/01/2023   CHOLHDL 3 03/01/2023   CHOLHDL 2 04/23/2022   CHOLHDL 4 02/26/2022   Lab Results  Component Value Date   ALT 31 03/01/2023   AST 24 03/01/2023   ALKPHOS 106 03/01/2023   BILITOT 0.7 03/01/2023   The 10-year ASCVD risk score (Arnett DK, et al., 2019) is: 5%   Values used to calculate the score:     Age: 18 years     Sex: Female     Is Non-Hispanic African American: Yes     Diabetic: Yes     Tobacco smoker: No     Systolic Blood Pressure:  112 mmHg     Is BP treated: Yes     HDL Cholesterol: 53.1 mg/dL     Total Cholesterol: 139 mg/dL  Plan: Continue Crestor 10 mg daily.  Continue to work on nutrition plan -decreasing simple carbohydrates, increasing lean proteins, decreasing saturated fats and cholesterol , avoiding trans fats and exercise as able to promote weight loss, improve lipids and decrease cardiovascular risks. Continue GLP-1 RA medication, switching to [facility] at  this time which should contribute to further CV risk reduction.      ATTESTASTION STATEMENTS:  Reviewed by clinician on day of visit: allergies, medications, problem list, medical history, surgical history, family history, social history, and previous encounter notes.   I have personally spent 48 minutes total time today in preparation, patient care, nutritional counseling and documentation for this visit, including the following: review of clinical lab tests; review of medical tests/procedures/services.      Keithen Capo, PA-C

## 2023-05-05 NOTE — Telephone Encounter (Signed)
Prior Auth submitted for Mounjaro 7.5 mg.  Awaiting determination.

## 2023-05-09 NOTE — Telephone Encounter (Signed)
Approved DRUG Mounjaro Inj 7.5/0.5 NAME: Ashley Mercado PATIENT Member ID: [id number] NAME: Case number: [id number] NEXT You can now fill your prescription for this medication. STEPS: VALID 05/05/2023 - 05/04/2024  Patient notified.

## 2023-05-31 ENCOUNTER — Ambulatory Visit (INDEPENDENT_AMBULATORY_CARE_PROVIDER_SITE_OTHER): Payer: BC Managed Care – PPO | Admitting: Physician Assistant

## 2023-05-31 ENCOUNTER — Encounter (INDEPENDENT_AMBULATORY_CARE_PROVIDER_SITE_OTHER): Payer: Self-pay | Admitting: Physician Assistant

## 2023-05-31 VITALS — BP 112/76 | HR 86 | Temp 97.6°F | Ht 67.0 in | Wt 239.0 lb

## 2023-05-31 DIAGNOSIS — E785 Hyperlipidemia, unspecified: Secondary | ICD-10-CM | POA: Diagnosis not present

## 2023-05-31 DIAGNOSIS — E1165 Type 2 diabetes mellitus with hyperglycemia: Secondary | ICD-10-CM

## 2023-05-31 DIAGNOSIS — E1169 Type 2 diabetes mellitus with other specified complication: Secondary | ICD-10-CM

## 2023-05-31 DIAGNOSIS — E669 Obesity, unspecified: Secondary | ICD-10-CM | POA: Diagnosis not present

## 2023-05-31 DIAGNOSIS — Z7985 Long-term (current) use of injectable non-insulin antidiabetic drugs: Secondary | ICD-10-CM

## 2023-05-31 DIAGNOSIS — Z6837 Body mass index (BMI) 37.0-37.9, adult: Secondary | ICD-10-CM

## 2023-05-31 MED ORDER — TIRZEPATIDE 7.5 MG/0.5ML ~~LOC~~ SOAJ: 7.5000 mg | SUBCUTANEOUS | 1 refills | Status: DC

## 2023-05-31 NOTE — Progress Notes (Signed)
SUBJECTIVE:  Chief Complaint: Obesity Discussed the use of AI scribe software for clinical note transcription with the patient, who gave verbal consent to proceed.  History of Present Illness     Interim History: The patient, a 52 year old individual with a history of type 2 diabetes, hyperlipidemia, and hypertension, presents for a follow-up visit regarding her obesity treatment plan.  Weight loss of eight pounds since the last visit, which she attributes to the medication regimen. She has been taking Mounjaro weekly in the evenings and reports no side effects.  The patient's muscle mass has decreased slightly, which is expected during weight loss, but it is not excessive. Her overall adipose percent has decreased to 45%, and her visceral adipose rating is down to 13%.  The patient has been managing her diet well, even during social events and holidays. She reports choosing healthier options over high-calorie foods, such as choosing an apple over cake. She also reports portion control strategies, such as not letting food items touch each other on the plate.  The patient's blood pressure and heart rate were within normal limits during the visit.  Kaycia is here to discuss her progress with her obesity treatment plan. She is on the Category 2 Plan and states she is following her eating plan approximately 85 % of the time. She states she is exercising walking  4 times per week.   OBJECTIVE: Visit Diagnoses: Problem List Items Addressed This Visit     DM (diabetes mellitus), type 2 (HCC) - Primary   Relevant Medications   tirzepatide (MOUNJARO) 7.5 MG/0.5ML Pen   Hyperlipidemia associated with type 2 diabetes mellitus (HCC)   Relevant Medications   tirzepatide (MOUNJARO) 7.5 MG/0.5ML Pen   Generalized obesity- Start BMI 39.9   Relevant Medications   tirzepatide (MOUNJARO) 7.5 MG/0.5ML Pen   BMI 37.0-37.9, adult  Obesity Started Mounjaro 05/05/23 after being on Ozempic since 09/2021  but stalled/regain and now losing again and reports significant suppression of "food noise" and cravings. Also feeling like she has more energy.  Total weight loss of 15 lbs.  TBW loss of 5.91% Successfully lost 8 pounds, reducing weight from 114 kg to 108 kg. Muscle mass maintained, with a decrease in overall adipose percentage to 45% and visceral adipose rating to 13%. No side effects from current medication regimen. Reduction in food cravings positively influencing dietary choices. Discussed importance of monitoring muscle mass during weight loss to prevent excessive muscle loss. Recommended evening medication intake to mitigate potential nausea. - Continue current medication regimen - Send prescription to [facility] with refill - Provide dietary strategies for Thanksgiving and Christmas to maintain portion control and healthy eating habits  Type 2 Diabetes Mellitus with other specified complication, without long-term current use of insulin HgbA1c is at goal. Last A1c was 5.9  Medication(s): Mounjaro 7.5 mg SQ weekly Denies mass in neck, dysphagia, dyspepsia, persistent hoarseness, abdominal pain, or N/V/Constipation or diarrhea. Has annual eye exam. Mood is stable.  She is working  on nutrition plan to decrease simple carbohydrates, increase lean proteins and exercise to promote weight loss and improve glycemic control .   Lab Results  Component Value Date   HGBA1C 5.9 03/01/2023   HGBA1C 5.6 07/26/2022   HGBA1C 5.5 01/14/2022   Lab Results  Component Value Date   MICROALBUR <0.7 03/01/2023   LDLCALC 69 03/01/2023   CREATININE 0.94 03/01/2023   Lab Results  Component Value Date   GFR 68.88 03/01/2023   GFR 71.18 02/26/2022   GFR  67.30 02/02/2021    Plan: Continue and refill Mounjaro 7.5 mg SQ weekly Continue working  on nutrition plan to decrease simple carbohydrates, increase lean proteins and exercise to promote weight loss and improve glycemic control .  Hyperlipidemia LDL  is at goal. Medication(s): Crestor 10 mg daily- no side effects and Mounjaro 7.5 mg weekly Cardiovascular risk factors: diabetes mellitus, dyslipidemia, hypertension, and obesity (BMI >= 30 kg/m2)  Lab Results  Component Value Date   CHOL 139 03/01/2023   HDL 53.10 03/01/2023   LDLCALC 69 03/01/2023   TRIG 88.0 03/01/2023   CHOLHDL 3 03/01/2023   CHOLHDL 2 04/23/2022   CHOLHDL 4 02/26/2022   Lab Results  Component Value Date   ALT 31 03/01/2023   AST 24 03/01/2023   ALKPHOS 106 03/01/2023   BILITOT 0.7 03/01/2023   The 10-year ASCVD risk score (Arnett DK, et al., 2019) is: 5%   Values used to calculate the score:     Age: 52 years     Sex: Female     Is Non-Hispanic African American: Yes     Diabetic: Yes     Tobacco smoker: No     Systolic Blood Pressure: 112 mmHg     Is BP treated: Yes     HDL Cholesterol: 53.1 mg/dL     Total Cholesterol: 139 mg/dL  Plan: Continue statin. Continue Mounjaro for CV risk reduction as well as Type 2 diabetes management.  Continue to work on nutrition plan -decreasing simple carbohydrates, increasing lean proteins, decreasing saturated fats and cholesterol , avoiding trans fats and exercise as able to promote weight loss, improve lipids and decrease cardiovascular risks.    Follow-up - Schedule follow-up appointment for December 30th at 8 AM.  Vitals Temp: 97.6 F (36.4 C) BP: 112/76 Pulse Rate: 86 SpO2: 99 %   Anthropometric Measurements Height: 5\' 7"  (1.702 m) Weight: 239 lb (108.4 kg) BMI (Calculated): 37.42 Weight at Last Visit: 247 lb Weight Lost Since Last Visit: 8 lb Weight Gained Since Last Visit: 0 Starting Weight: 254 lb Total Weight Loss (lbs): 15 lb (6.804 kg) Peak Weight: 254 lb   Body Composition  Body Fat %: 45.2 % Fat Mass (lbs): 108.4 lbs Muscle Mass (lbs): 124.8 lbs Total Body Water (lbs): 87 lbs Visceral Fat Rating : 13   Other Clinical Data Fasting: no Labs: no Today's Visit #: 26 Starting  Date: 09/10/21     ASSESSMENT AND PLAN:  Diet: Delrae is currently in the action stage of change. As such, her goal is to continue with weight loss efforts. She has agreed to Category 2 Plan.  Exercise: Xan has been instructed to work up to a goal of 150 minutes of combined cardio and strengthening exercise per week for weight loss and overall health benefits.   Behavior Modification:  We discussed the following Behavioral Modification Strategies today: increasing lean protein intake, decreasing simple carbohydrates, increasing vegetables, increase H2O intake, increase high fiber foods, no skipping meals, meal planning and cooking strategies, holiday eating strategies, and planning for success. We discussed various medication options to help Noriah with her weight loss efforts and we both agreed to continue Mounjaro 7.5 mg weekly for Type 2 diabetes management.  Return in about 5 weeks (around 07/05/2023).Marland Kitchen She was informed of the importance of frequent follow up visits to maximize her success with intensive lifestyle modifications for her multiple health conditions.  Attestation Statements:   Reviewed by clinician on day of visit: allergies, medications, problem list, medical  history, surgical history, family history, social history, and previous encounter notes.   Time spent on visit including pre-visit chart review and post-visit care and charting was 40 minutes.    Jaishawn Witzke, PA-C

## 2023-06-07 ENCOUNTER — Other Ambulatory Visit: Payer: Self-pay | Admitting: Family Medicine

## 2023-06-07 DIAGNOSIS — Z1231 Encounter for screening mammogram for malignant neoplasm of breast: Secondary | ICD-10-CM

## 2023-07-05 ENCOUNTER — Encounter (INDEPENDENT_AMBULATORY_CARE_PROVIDER_SITE_OTHER): Payer: Self-pay | Admitting: Physician Assistant

## 2023-07-05 ENCOUNTER — Ambulatory Visit (INDEPENDENT_AMBULATORY_CARE_PROVIDER_SITE_OTHER): Payer: BC Managed Care – PPO | Admitting: Physician Assistant

## 2023-07-05 VITALS — BP 111/78 | HR 94 | Temp 97.7°F | Ht 67.0 in | Wt 238.0 lb

## 2023-07-05 DIAGNOSIS — E785 Hyperlipidemia, unspecified: Secondary | ICD-10-CM | POA: Diagnosis not present

## 2023-07-05 DIAGNOSIS — E669 Obesity, unspecified: Secondary | ICD-10-CM | POA: Diagnosis not present

## 2023-07-05 DIAGNOSIS — E1165 Type 2 diabetes mellitus with hyperglycemia: Secondary | ICD-10-CM | POA: Diagnosis not present

## 2023-07-05 DIAGNOSIS — E1169 Type 2 diabetes mellitus with other specified complication: Secondary | ICD-10-CM | POA: Diagnosis not present

## 2023-07-05 MED ORDER — TIRZEPATIDE 7.5 MG/0.5ML ~~LOC~~ SOAJ: 7.5000 mg | SUBCUTANEOUS | 1 refills | Status: DC

## 2023-07-05 NOTE — Progress Notes (Signed)
 SUBJECTIVE:  Chief Complaint: Obesity  Interim History: She is down 1 lb since last visit Down 16 lbs overall TBW loss of 6.3% Aldina Mercado, a 52 year old individual with a history of type 2 diabetes, hyperlipidemia, hypertension, and vitamin D  deficiency, has been following an obesity treatment plan. She has been on Mounjaro  7.5 mg weekly for diabetes and Crestor  10 mg daily for hypercholesterolemia.   Over the holidays, she managed to maintain her weight, avoiding indulgence in cakes and desserts. She has lost 16 pounds overall since starting the treatment plan. She has noticed a small, very transient, red mark at the site of Mounjaro  injection, which itches slightly but resolves on its own. She has not experienced any generalized rash or other systemic symptoms suggestive of an allergic reaction.  She has not been monitoring her blood sugars recently as she misplaced her meter. She has been mainly walking for exercise but plans to start a weight program and join a 100-day challenge on her home Peloton treadmill in the new year.  Her sleep pattern has been somewhat disrupted due to work, typically going to bed at 8 pm and waking up at 3 am to go to work. She has been making healthier dietary choices, substituting spaghetti squash for noodles and incorporating more fish into her meals.  Ashley Mercado is here to discuss her progress with her obesity treatment plan. She is on the Category 2 Plan and states she is following her eating plan approximately 80 % of the time. She states she is exercising walking/cardio 30 minutes 4 times per week.   OBJECTIVE: Visit Diagnoses: Problem List Items Addressed This Visit     DM (diabetes mellitus), type 2 (HCC) - Primary   Relevant Medications   tirzepatide  (MOUNJARO ) 7.5 MG/0.5ML Pen   Hyperlipidemia associated with type 2 diabetes mellitus (HCC)   Relevant Medications   tirzepatide  (MOUNJARO ) 7.5 MG/0.5ML Pen   Generalized obesity- Start BMI 39.9    Relevant Medications   tirzepatide  (MOUNJARO ) 7.5 MG/0.5ML Pen  Obesity Patient is doing well with Mounjaro  7.5 mg weekly, having lost 16 pounds (~5% body weight) while maintaining muscle mass. Minor local reaction at the injection site noted. Discussed allergic reactions are rare but patient advised to monitor for severe reactions and use Benadryl if needed and seek medical attention if any more concerning symptoms like rash or generalized itching occurs.   Engaging in exercise and dietary modifications, planning to start a weight program and participate in a Peloton challenge. - Continue Mounjaro  7.5 mg weekly - Monitor for severe allergic reactions/evidence of systemic reaction like rash or itching and seek medical attention if develops any symptoms.  - Encourage periodic blood sugar checks - Encourage continued exercise, including Peloton challenge and weight program - Follow up in 6 weeks  Type 2 Diabetes Mellitus Lab Results  Component Value Date   HGBA1C 5.9 03/01/2023   HGBA1C 5.6 07/26/2022   HGBA1C 5.5 01/14/2022   Lab Results  Component Value Date   MICROALBUR <0.7 03/01/2023   LDLCALC 69 03/01/2023   CREATININE 0.94 03/01/2023  Monitor very slight local reaction to injection. No evidence for any systemic reaction/symptoms of systemic reaction.   Patient on Mounjaro  for diabetes management. Not checking blood sugars recently, needs to find meter. No hypoglycemia symptoms reported. Lows uncommon with Mounjaro  unless combined with insulin  or sulfonylureas. - Encourage periodic blood sugar checks, especially fasting sugars several times a week Continue/refill Mounjaro  7.5 mg weekly x 60 days.  - Monitor A1c and  other relevant labs at next visit She is working  on nutrition plan to decrease simple carbohydrates, increase lean proteins and exercise to promote weight loss and improve glycemic control .  Hyperlipidemia The 10-year ASCVD risk score (Arnett DK, et al., 2019) is:  4.8%   Values used to calculate the score:     Age: 65 years     Sex: Female     Is Non-Hispanic African American: Yes     Diabetic: Yes     Tobacco smoker: No     Systolic Blood Pressure: 111 mmHg     Is BP treated: Yes     HDL Cholesterol: 53.1 mg/dL     Total Cholesterol: 139 mg/dL  Patient on Crestor  10 mg daily. No new concerns reported. - Continue Crestor  10 mg daily - Monitor lipid levels at next visit Continue to work on nutrition plan -decreasing simple carbohydrates, increasing lean proteins, decreasing saturated fats and cholesterol , avoiding trans fats and exercise as able to promote weight loss, improve lipids and decrease cardiovascular risks.   General Health Maintenance Patient engaging in healthy lifestyle choices, including exercise and dietary modifications. Planning to start a weight program and participate in a Peloton challenge. - Encourage continued healthy eating and exercise - Monitor overall health and wellness at next visit  Follow-up - Schedule follow-up appointment for February 11th at 8 AM - Ensure patient is fasting for labs at next visit.  Vitals Temp: 97.7 F (36.5 C) BP: 111/78 Pulse Rate: 94 SpO2: 98 %   Anthropometric Measurements Height: 5' 7 (1.702 m) Weight: 238 lb (108 kg) BMI (Calculated): 37.27 Weight at Last Visit: 239lb Weight Lost Since Last Visit: 1lb Weight Gained Since Last Visit: 0 Starting Weight: 254lb Total Weight Loss (lbs): 16 lb (7.258 kg) Peak Weight: 254lb   Body Composition  Body Fat %: 45.2 % Fat Mass (lbs): 107.6 lbs Muscle Mass (lbs): 124 lbs Total Body Water (lbs): 83.4 lbs Visceral Fat Rating : 13   Other Clinical Data Fasting: yes Labs: no Today's Visit #: 27 Starting Date: 09/10/21     ASSESSMENT AND PLAN:  Diet: Susannah is currently in the action stage of change. As such, her goal is to continue with weight loss efforts. She has agreed to Category 2 Plan.  Exercise: Katherene has been  instructed to work up to a goal of 150 minutes of combined cardio and strengthening exercise per week for weight loss and overall health benefits.   Behavior Modification:  We discussed the following Behavioral Modification Strategies today: increasing lean protein intake, decreasing simple carbohydrates, increasing vegetables, increase H2O intake, increase high fiber foods, no skipping meals, and planning for success. We discussed various medication options to help Ellianne with her weight loss efforts and we both agreed to continue Mounjaro  7.5 mg weekly for Type 2 diabetes management.  Return in about 6 weeks (around 08/16/2023) for Fasting Lab.SABRA She was informed of the importance of frequent follow up visits to maximize her success with intensive lifestyle modifications for her multiple health conditions.  Attestation Statements:   Reviewed by clinician on day of visit: allergies, medications, problem list, medical history, surgical history, family history, social history, and previous encounter notes.   Time spent on visit including pre-visit chart review and post-visit care and charting was 40 minutes.    Carlei Huang, PA-C

## 2023-07-08 ENCOUNTER — Ambulatory Visit
Admission: RE | Admit: 2023-07-08 | Discharge: 2023-07-08 | Disposition: A | Discharge status: Home / Self Care | Payer: BC Managed Care – PPO | Source: Ambulatory Visit

## 2023-07-08 DIAGNOSIS — Z1231 Encounter for screening mammogram for malignant neoplasm of breast: Secondary | ICD-10-CM

## 2023-08-06 IMAGING — MR MR ANKLE*L* W/O CM
4 of 5 series · 13 of 40 positions shown · non-contrast
Comparison: Left foot radiographs 08/27/2021

CLINICAL DATA: Ankle pain. Tendon abnormality suspected. Medial
pain that radiates into arch of foot.

EXAM:
MRI OF THE LEFT ANKLE WITHOUT CONTRAST
TECHNIQUE: Multiplanar, multisequence MR imaging of the ankle was performed. No
intravenous contrast was administered.

[Series 3: PD fat-sat · axial · left · 3.0mm · 0.25mm/px · z∈[-83,+29]mm · 4 of 34 slices shown]
[im 1/34]
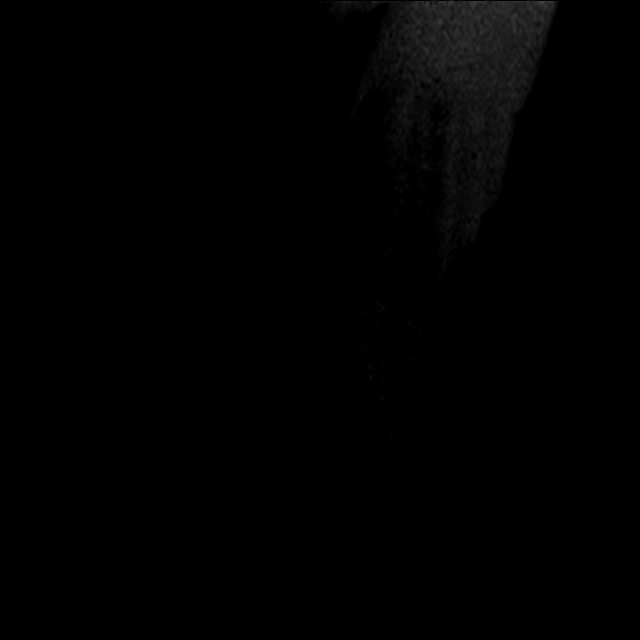
[im 5/34]
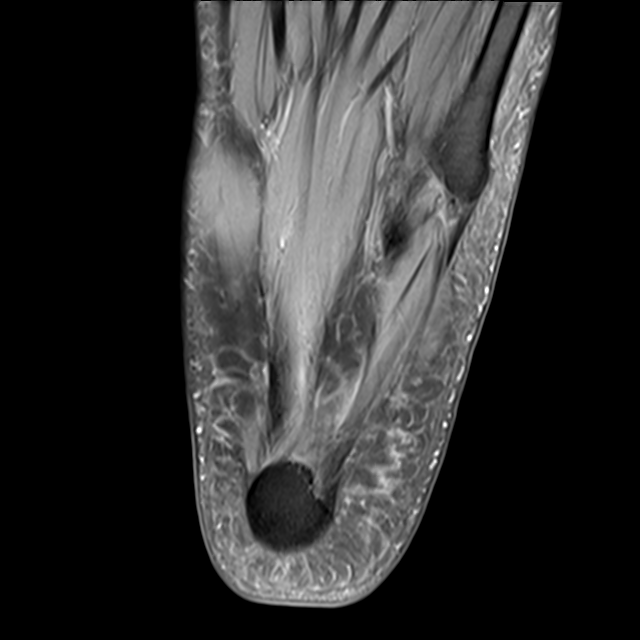
[im 19/34]
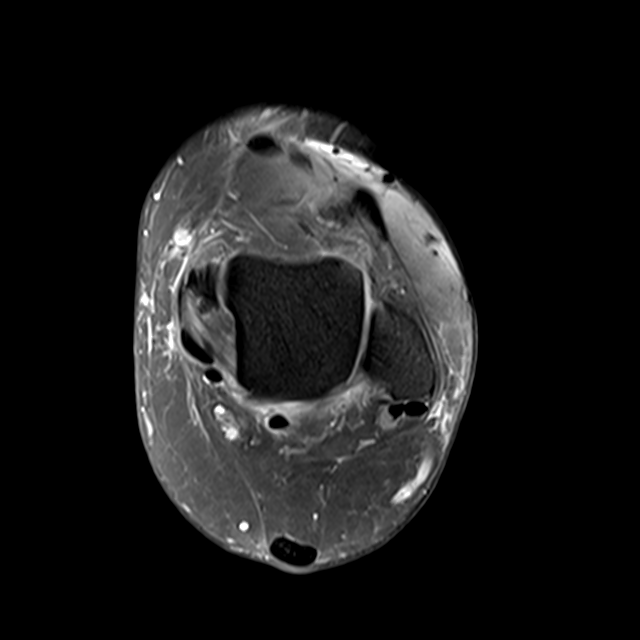
[im 29/34]
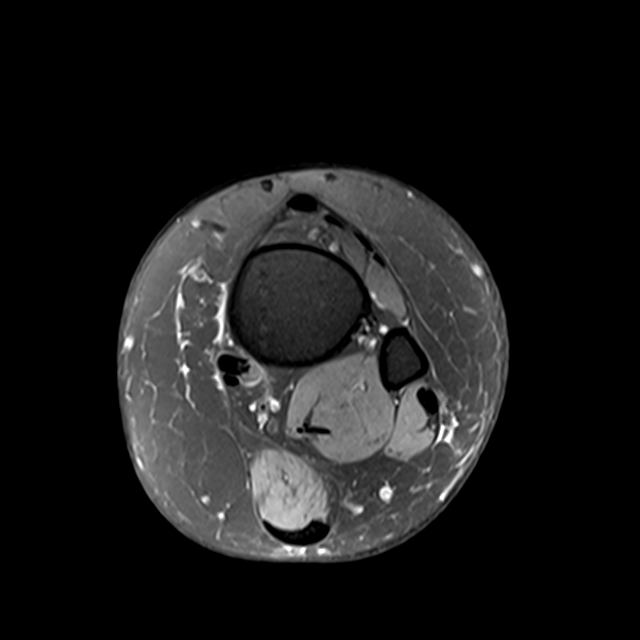

[Series 4: T2 fat-sat · axial · left · 3.0mm · 0.25mm/px · z∈[-67,+29]mm · 3 of 34 slices shown (1 of 2)]
[im 5/34]
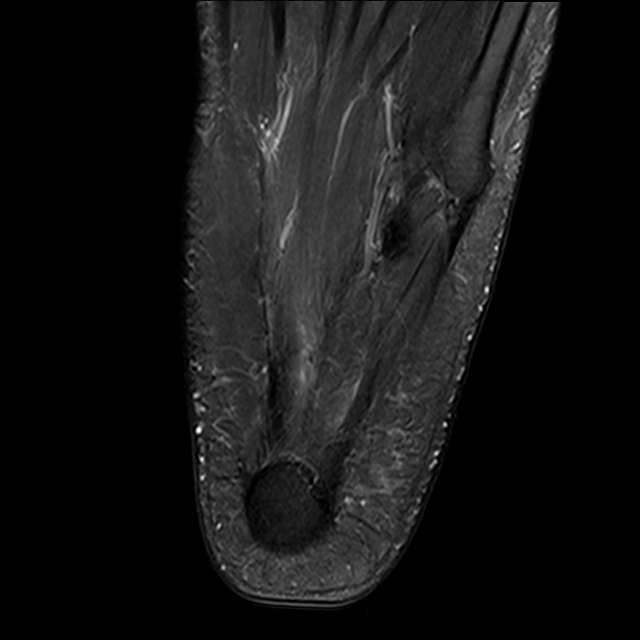
[im 17/34]
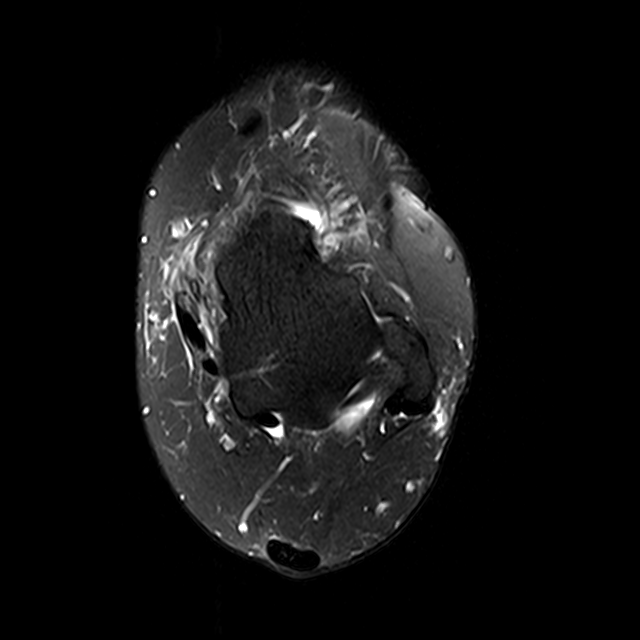
[im 29/34]
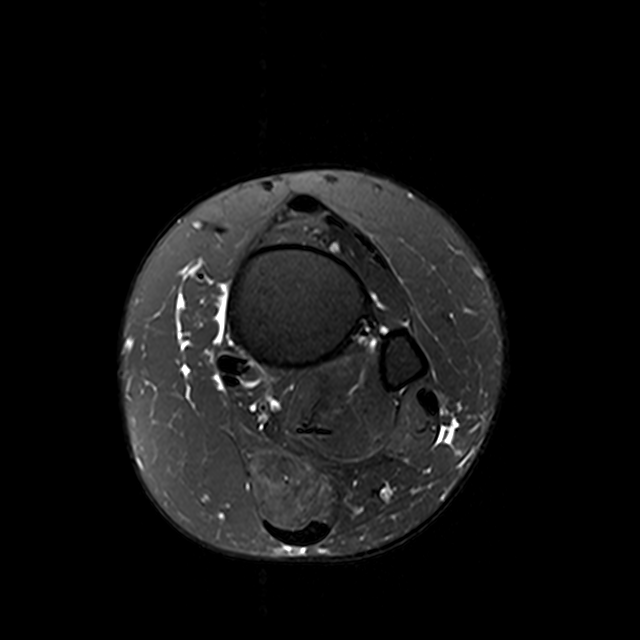

[Series 5: T1 · sagittal · left · 4.0mm · 0.28mm/px · 3 of 26 slices shown]
[im 5/26]
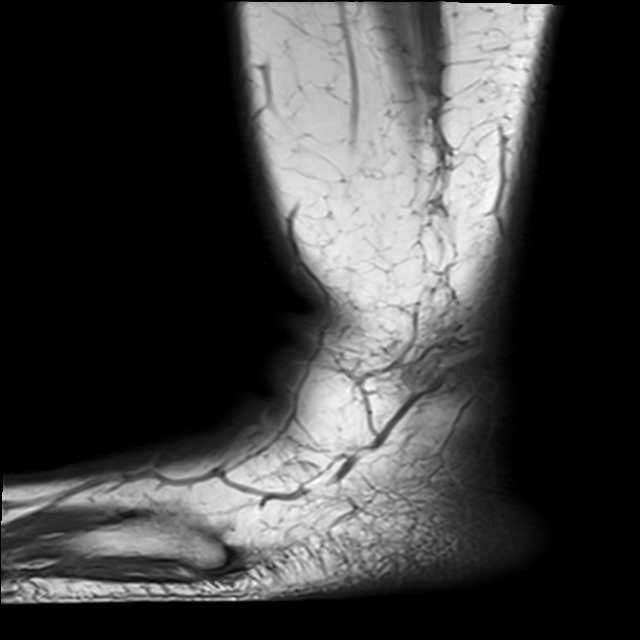
[im 13/26]
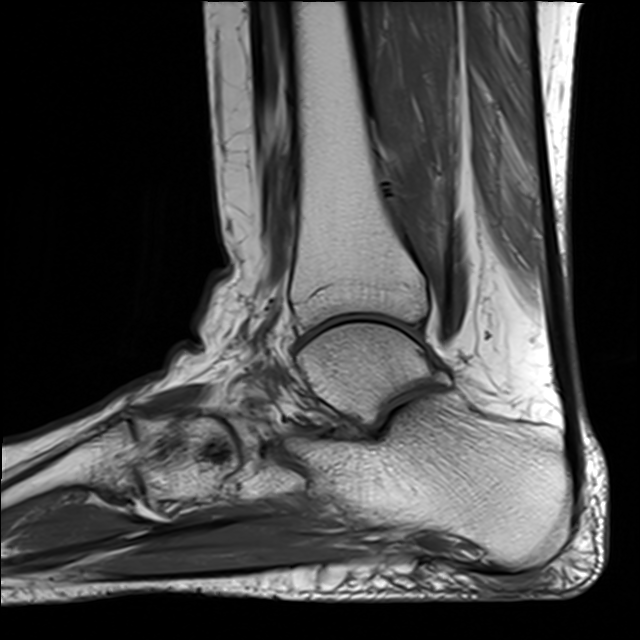
[im 21/26]
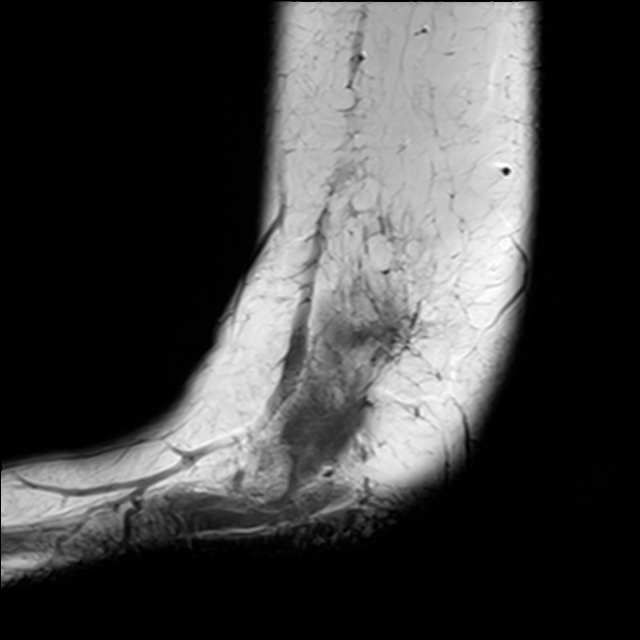

[Series 7: T2 fat-sat · coronal · left · 3.0mm · 0.25mm/px · 3 of 40 slices shown (2 of 2)]
[im 5/40]
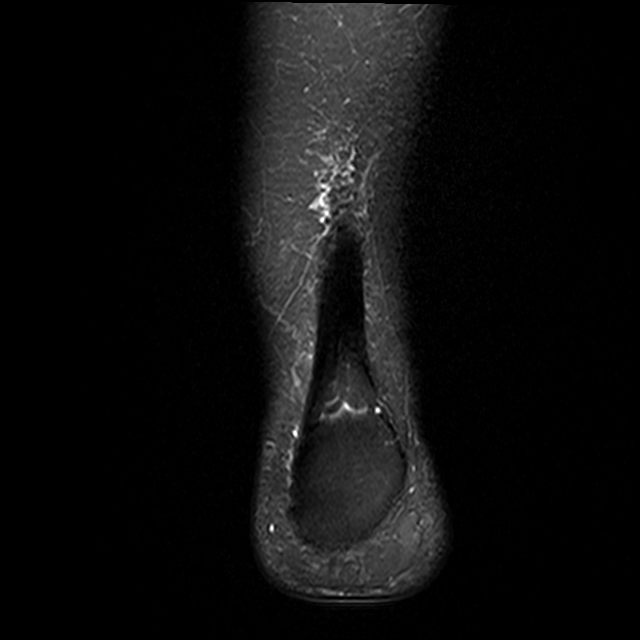
[im 22/40]
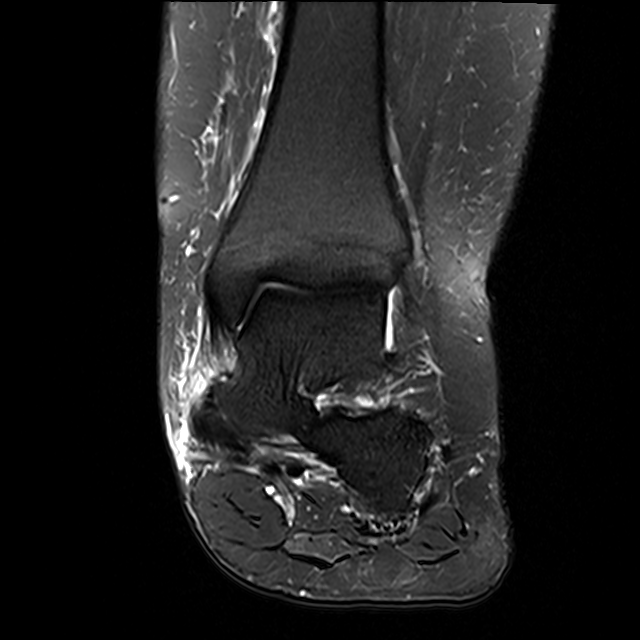
[im 35/40]
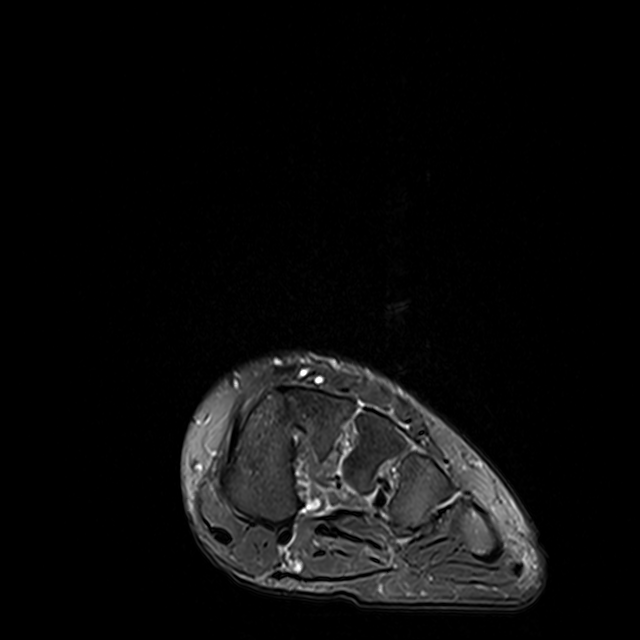

[13 of 40 positions shown; findings below may reference images not displayed]

FINDINGS: TENDONS

Peroneal: The peroneus longus and brevis tendons are intact.

Posteromedial: Mild posterior tibial tenosynovitis. The flexor
digitorum longus and flexor hallucis longus tendons are intact. Mild
fluid within the flexor hallucis longus tendon sheath within the
hindfoot may be secondary to small tibiotalar and posterior subtalar
joint effusions.

Anterior: The tibialis anterior, extensor hallucis longus and
extensor digitorum longus tendons are intact.

Achilles: Intact.

Plantar Fascia: Intact.  Tiny plantar calcaneal heel spur.

LIGAMENTS

Lateral: Mild attenuation of the anterior talofibular ligament may
represent the patient's normal anatomy versus a remote
partial-thickness tear. The calcaneofibular, posterior talofibular
and anterior and posterior tibiofibular ligaments are intact.

Medial: The tibiotalar deep deltoid and tibial spring ligaments are
intact.

CARTILAGE

Ankle Joint: There is partial-thickness cartilage loss within the
far medial aspect of the talar dome in a region measuring up to 9 mm
in AP dimension (sagittal image 10) and 6 mm in transverse dimension
(coronal image 19). Underlying subchondral marrow edema/cystic
change.

Subtalar Joints/Sinus Tarsi: Fat is preserved within sinus tarsi.

Bones: No acute fracture.

Other: The tarsal tunnel is unremarkable. The Lisfranc ligament
complex is intact.
IMPRESSION: Mild posterior tibial tenosynovitis.

Mild attenuation of the anterior talofibular ligament may represent
the patient's normal anatomy versus a remote partial-thickness tear.

Partial-thickness cartilage degenerative change with subchondral
edema/cystic change within the far medial aspect of the talar dome.

## 2023-08-16 ENCOUNTER — Telehealth (INDEPENDENT_AMBULATORY_CARE_PROVIDER_SITE_OTHER): Payer: BC Managed Care – PPO | Admitting: Physician Assistant

## 2023-08-16 ENCOUNTER — Encounter (INDEPENDENT_AMBULATORY_CARE_PROVIDER_SITE_OTHER): Payer: Self-pay | Admitting: Physician Assistant

## 2023-08-16 DIAGNOSIS — E119 Type 2 diabetes mellitus without complications: Secondary | ICD-10-CM | POA: Diagnosis not present

## 2023-08-16 DIAGNOSIS — E1165 Type 2 diabetes mellitus with hyperglycemia: Secondary | ICD-10-CM

## 2023-08-16 DIAGNOSIS — I152 Hypertension secondary to endocrine disorders: Secondary | ICD-10-CM

## 2023-08-16 DIAGNOSIS — J111 Influenza due to unidentified influenza virus with other respiratory manifestations: Secondary | ICD-10-CM | POA: Diagnosis not present

## 2023-08-16 DIAGNOSIS — E785 Hyperlipidemia, unspecified: Secondary | ICD-10-CM | POA: Diagnosis not present

## 2023-08-16 DIAGNOSIS — E1169 Type 2 diabetes mellitus with other specified complication: Secondary | ICD-10-CM

## 2023-08-16 DIAGNOSIS — Z7985 Long-term (current) use of injectable non-insulin antidiabetic drugs: Secondary | ICD-10-CM

## 2023-08-16 DIAGNOSIS — E669 Obesity, unspecified: Secondary | ICD-10-CM

## 2023-08-16 DIAGNOSIS — Z6837 Body mass index (BMI) 37.0-37.9, adult: Secondary | ICD-10-CM

## 2023-08-16 MED ORDER — TIRZEPATIDE 7.5 MG/0.5ML ~~LOC~~ SOAJ: 7.5000 mg | SUBCUTANEOUS | 1 refills | Status: DC

## 2023-08-16 NOTE — Progress Notes (Signed)
TeleHealth Visit:  This visit was completed with telemedicine (audio/video) technology. Rivka has verbally consented to this TeleHealth visit. The patient is located at her office, the provider is located at the [facility] office. The participants in this visit include the listed provider and patient. The visit was conducted today via MyChart video.  OBESITY Ashley Mercado is here to discuss her progress with her obesity treatment plan along with follow-up of her obesity related diagnoses.   Today's visit was # 28 Starting weight: 254 lbs Starting date: 09/10/2021 Weight at last in office visit: 238 lbs on 07/05/23 Total weight loss: 16 lbs at last in office visit on 07/05/23. Today's reported weight (236 lbs):  236 lbs  Nutrition Plan: the Category 2 plan - 80% adherence.  Current exercise: walking and weightlifting- Treadmill/weights 3-4 x weekly for 15-30 mins  Interim History:  Ashley Mercado is a 53 year old female with obesity who presents for follow-up of her obesity treatment plan.  She is managing her obesity with Mounjaro 7.5 mg weekly, experiencing no issues with medication adherence or side effects. Her weight has decreased slightly from 238 pounds to 236 pounds, likely due to reduced food intake during her recent illness. She has not been exercising due to her illness but previously engaged in 30 minutes of treadmill exercise three to four times a week and 15-minute body weight exercises three times a week.  She has type 2 diabetes. She monitors her blood sugar levels, with the last reading being 86 mg/dL. She did not skip her Mounjaro dose during her illness and reports no issues with nausea, vomiting, diarrhea, or constipation. She takes her medication in the evening after meals to avoid nausea.  She recently had the flu, which began with pressure around her neck and shoulders, initially thought to be migraines. Her symptoms worsened, prompting her to leave work and get tested for  COVID-19, which was negative. She was diagnosed with the flu at the ER and was prescribed Tamiflu, which was not filled until Sunday morning. By Sunday, she felt somewhat better, and by Monday, she was feeling good but very tired. She has been drinking various fluids. Her appetite has been reduced, and she is currently eating light meals, such as chicken salad, until she can resume normal eating. No fever, nausea, vomiting, diarrhea, constipation, difficulty swallowing, changes in vision, or mood changes. She experienced fatigue and tiredness during her illness.  She manages hyperlipidemia and hypertension with Crestor 10 mg daily and spironolactone 50 mg daily. She has not reported any new symptoms related to these conditions. Eating all of the food on the plan., Protein intake is as prescribed, Is not drinking sugar sweetened beverages., Is not exceeding snack calorie allotment, Is not skipping meals, Meeting calorie goals., Meeting protein goals., Water intake is adequate., Denies polyphagia, and Denies excessive cravings.  Hunger controlled: moderately controlled. Cravings controlled:  moderately controlled.  Plan fasting labs at next visit.   Pharmacotherapy: Kaavya is on Mounjaro 7.5 mg SQ weekly Adverse side effects: None Hunger is moderately controlled.  Cravings are moderately controlled.  Assessment/Plan:  1. Influenza Ashley Mercado, a 53 year old female, was seen via video visit for follow-up due to her recent influenza diagnosis. She initially experienced neck and shoulder pressure, which she attributed to migraines. Her condition worsened by Thursday night, and by Friday, she sought medical attention and was diagnosed with the flu at the ER. She started Tamiflu on Sunday and reported improvement by Monday, though she experienced significant fatigue. She has  been staying hydrated and eating light meals due to stomach discomfort. - Continue hydration - Gradually resume normal diet as  tolerated - Monitor for any worsening or new symptoms - Follow up if symptoms persist or worsen  Type 2 Diabetes Mellitus Ashley Mercado is managing her type 2 diabetes with Mounjaro. Her recent blood sugar reading was 86 mg/dL, within normal range. No hypoglycemia noted. Emphasized the importance of regular blood sugar monitoring and maintaining a balanced diet. Lab Results  Component Value Date   HGBA1C 5.9 03/01/2023   HGBA1C 5.6 07/26/2022   HGBA1C 5.5 01/14/2022   Lab Results  Component Value Date   MICROALBUR <0.7 03/01/2023   LDLCALC 69 03/01/2023   CREATININE 0.94 03/01/2023   She is working  on nutrition plan to decrease simple carbohydrates, increase lean proteins and exercise to promote weight loss and improve glycemic control . Continue/refill Mounjaro 7.5 mg weekly.  - Continue monitoring blood sugar levels - Repeat A1c and insulin level at next visit  Obesity Ashley Mercado is on Mounjaro 7.5 mg weekly for Type 2 diabetes as well as obesity management. She reported no issues with the medication and has experienced weight loss, currently weighing 236.6 lbs, down from 238 lbs. Advised to focus on protein intake and gradually resume exercise. - Continue Mounjaro 7.5 mg weekly - Refill Mounjaro prescription - Gradually resume exercise routine as energy levels permit - Focus on protein intake and balanced nutrition  Hyperlipidemia Ashley Mercado is on Crestor 10 mg daily for hyperlipidemia. No new issues reported. Emphasized the importance of continuing her medication and monitoring lipid levels to reduce cardiovascular risk. - Continue Crestor 10 mg daily - Repeat lipid panel at next visit  Hypertension Ashley Mercado is on spironolactone 50 mg daily for hypertension. No new issues reported. Emphasized the importance of medication adherence and regular blood pressure monitoring to prevent complications. - Continue spironolactone 50 mg daily Continue to work on nutrition plan to promote weight loss  and improve BP control.  Monitor for symptoms or signs of hypotension with continued weight loss and treatment with Mounjaro.  Follow up labs next visit.   Vitamin D Deficiency Ashley Mercado has vitamin D deficiency. No new issues reported. Discussed the importance of maintaining adequate vitamin D levels for bone health and overall well-being. - Repeat vitamin D level at next visit  General Health Maintenance Ashley Mercado received her flu vaccination this year, which likely shortened the course of her illness. Discussed the importance of routine health maintenance, including vaccinations and regular check-ups. - Encourage hydration - Gradually resume normal activities - Schedule blood work for next visit including A1c, insulin level, lipid panel, vitamin D, and B12  Follow-up - Schedule follow-up visit on March 13th at 8 AM - Ensure fasting for at least 8 hours before the visit - Hydrate well before the visit.   Starting BMI 39.9  Generalized Obesity: Current BMI 37.27  Pharmacotherapy Plan Continue and refill  Mounjaro 7.5 mg SQ weekly  Adeja is currently in the action stage of change. As such, her goal is to continue with weight loss efforts.  She has agreed to the Category 2 plan.  Exercise goals: For substantial health benefits, adults should do at least 150 minutes (2 hours and 30 minutes) a week of moderate-intensity, or 75 minutes (1 hour and 15 minutes) a week of vigorous-intensity aerobic physical activity, or an equivalent combination of moderate- and vigorous-intensity aerobic activity. Aerobic activity should be performed in episodes of at least 10 minutes, and preferably, it should  be spread throughout the week.  Behavioral modification strategies: increasing lean protein intake, decreasing simple carbohydrates , no meal skipping, increase water intake, planning for success, increasing fiber rich foods, avoiding temptations, and mindful eating.  Ashley Mercado has agreed to follow-up with  our clinic in 4 weeks.  No orders of the defined types were placed in this encounter.   Medications Discontinued During This Encounter  Medication Reason   tirzepatide [facility]) 7.5 MG/0.5ML Pen Reorder     Meds ordered this encounter  Medications   tirzepatide (MOUNJARO) 7.5 MG/0.5ML Pen    Sig: Inject 7.5 mg into the skin once a week.    Dispense:  2 mL    Refill:  1      Objective:   VITALS: Per patient if applicable, see vitals. GENERAL: Alert and in no acute distress. CARDIOPULMONARY: No increased WOB. Speaking in clear sentences.  PSYCH: Pleasant and cooperative. Speech normal rate and rhythm. Affect is appropriate. Insight and judgement are appropriate. Attention is focused, linear, and appropriate.  NEURO: Oriented as arrived to appointment on time with no prompting.   Attestation Statements:   Reviewed by clinician on day of visit: allergies, medications, problem list, medical history, surgical history, family history, social history, and previous encounter notes.  Time spent on visit including the items listed below was 28 minutes.  -preparing to see the patient (e.g., review of tests, history, previous notes) -obtaining and/or reviewing separately obtained history -counseling and educating the patient/family/caregiver -documenting clinical information in the electronic or other health record -ordering medications, tests, or procedures -independently interpreting results and communicating results to the patient/ family/caregiver -referring and communicating with other health care professionals  -care coordination   Anthonny Schiller,PA-C

## 2023-09-09 ENCOUNTER — Encounter: Payer: Self-pay | Admitting: Family Medicine

## 2023-09-09 ENCOUNTER — Ambulatory Visit: Admitting: Family Medicine

## 2023-09-09 VITALS — BP 114/78 | HR 91 | Temp 98.3°F | Ht 67.0 in | Wt 246.1 lb

## 2023-09-09 DIAGNOSIS — M5412 Radiculopathy, cervical region: Secondary | ICD-10-CM | POA: Diagnosis not present

## 2023-09-09 DIAGNOSIS — R7989 Other specified abnormal findings of blood chemistry: Secondary | ICD-10-CM | POA: Diagnosis not present

## 2023-09-09 LAB — CBC WITH DIFFERENTIAL/PLATELET
Absolute Lymphocytes: 2393 {cells}/uL (ref 850–3900)
Absolute Monocytes: 705 {cells}/uL (ref 200–950)
Basophils Absolute: 53 {cells}/uL (ref 0–200)
Basophils Relative: 0.7 %
Eosinophils Absolute: 60 {cells}/uL (ref 15–500)
Eosinophils Relative: 0.8 %
HCT: 43.4 % (ref 35.0–45.0)
Hemoglobin: 14.1 g/dL (ref 11.7–15.5)
MCH: 29.3 pg (ref 27.0–33.0)
MCHC: 32.5 g/dL (ref 32.0–36.0)
MCV: 90 fL (ref 80.0–100.0)
MPV: 10.4 fL (ref 7.5–12.5)
Monocytes Relative: 9.4 %
Neutro Abs: 4290 {cells}/uL (ref 1500–7800)
Neutrophils Relative %: 57.2 %
Platelets: 452 10*3/uL — ABNORMAL HIGH (ref 140–400)
RBC: 4.82 10*6/uL (ref 3.80–5.10)
RDW: 12.5 % (ref 11.0–15.0)
Total Lymphocyte: 31.9 %
WBC: 7.5 10*3/uL (ref 3.8–10.8)

## 2023-09-09 MED ORDER — MELOXICAM 15 MG PO TABS: 15.0000 mg | ORAL_TABLET | Freq: Every day | ORAL | 1 refills | Status: DC | PRN

## 2023-09-09 NOTE — Progress Notes (Signed)
 Subjective:    Patient ID: Ashley Mercado, female    DOB: 1970-11-21, 53 y.o.   MRN: [id number]  HPI  Wt Readings from Last 3 Encounters:  09/09/23 246 lb 2 oz (111.6 kg)  07/05/23 238 lb (108 kg)  05/31/23 239 lb (108.4 kg)   38.55 kg/m  Vitals:   09/09/23 1414  BP: 114/78  Pulse: 91  Temp: 98.3 F (36.8 C)  SpO2: 100%    Pt presents fo right shoulder/neck/arm pain  Also follow up of elevated platelet ct   Same problem as a year ago  Different side   Started few weeks ago  Stiff in neck and back   Right shoulder/ neck and back aches  Makes it hard to sleep  Good rom of arm   In forearm - a pulling sensation   No n/t No weakness  Ibuprofen- 400 mg - took it down a little   Trigger is lying on right side  Arm has to supported if lying on left side   Has changed pillows several times  Hurts to tilt head right  No ice  Heat helps a bit      Imaging from 09/2022  Study Result  Narrative & Impression  CLINICAL DATA:  Pain in the left neck radiating to the scapula and left arm.   EXAM: CERVICAL SPINE - COMPLETE 4+ VIEW   COMPARISON:  None Available.   FINDINGS: There is no evidence of cervical spine fracture or prevertebral soft tissue swelling. Alignment is normal. There is mild-to-moderate multilevel degenerative disc disease, most significant at C6-7 where there is mild-to-moderate neuroforaminal stenosis at this level.   IMPRESSION: Mild-to-moderate multilevel degenerative disc disease, most significant at C6-7.    CLINICAL DATA:  Left neck pain radiating to the scapula and left arm.   EXAM: THORACIC SPINE 2 VIEWS   COMPARISON:  None Available.   FINDINGS: There is no evidence of thoracic spine fracture. Alignment is normal. No other significant bone abnormalities are identified.   IMPRESSION: Negative.   Saw [facility] about a year ago  Treated with meloxicam and PT Never finished the meloxicam  She never did PT  because they did not call her back when she called to schedule  She did her own exercise with some bands /helped a bit    Last plt ct was 449  At that time Hb 15.2  Due for re check   Lab Results  Component Value Date   IRON 57 03/08/2023   FERRITIN 116.3 03/08/2023     Patient Active Problem List   Diagnosis Date Noted   Cervical radiculopathy 09/09/2023   BMI 37.0-37.9, adult 04/12/2023   Generalized obesity- Start BMI 39.9 03/15/2023   Long-term current use of injectable noninsulin antidiabetic medication 03/08/2023   Degenerative disc disease, cervical 09/20/2022   History of colonic polyps 06/04/2022   DM type 2 with diabetic mixed hyperlipidemia (HCC) 03/08/2022   Vitamin D deficiency 03/05/2022   Low serum vitamin B12 03/05/2022   Hyperlipidemia associated with type 2 diabetes mellitus (HCC) 03/05/2022   Uterine fibroid 07/01/2020   Pelvic joint pain, left 06/24/2020   Colon cancer screening 04/17/2019   DM (diabetes mellitus), type 2 (HCC) 04/06/2017   Carpal tunnel syndrome 11/10/2016   Allergic rhinitis 11/10/2016   Encounter for routine gynecological examination 03/10/2016   Screening for HIV (human immunodeficiency virus) 03/07/2015   Essential hypertension 10/28/2014   Morbid obesity (HCC) 10/20/2011   Routine general medical examination at a  health care facility 02/06/2011   Depression 11/04/2010   HIDRADENITIS SUPPURATIVA 05/12/2010   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   Elevated platelet count 11/30/2006   KELOID SCAR 11/29/2006   Past Medical History:  Diagnosis Date   Allergies    Allergy    Bilateral swelling of feet    DM type 2 with diabetic mixed hyperlipidemia (HCC) 03/08/2022   Folliculitis    GERD (gastroesophageal reflux disease)    High blood pressure    High cholesterol    Hip pain    HSV infection    Hydradenitis    Hypertension    Keloid scar    Lactose intolerance    Menorrhagia    Multiple food allergies    Prediabetes    Tired    Past  Surgical History:  Procedure Laterality Date   COLONOSCOPY WITH PROPOFOL N/A 06/04/2019   Procedure: COLONOSCOPY WITH PROPOFOL;  Surgeon: Wyline Mood, MD;  Location: [facility] ENDOSCOPY;  Service: Gastroenterology;  Laterality: N/A;   COLONOSCOPY WITH PROPOFOL N/A 06/04/2022   Procedure: COLONOSCOPY WITH PROPOFOL;  Surgeon: Wyline Mood, MD;  Location: [facility] ENDOSCOPY;  Service: Gastroenterology;  Laterality: N/A;   FOOT SURGERY Right    Ligment   Social History   Tobacco Use   Smoking status: Never   Smokeless tobacco: Never  Vaping Use   Vaping status: Never Used  Substance Use Topics   Alcohol use: Yes    Alcohol/week: 1.0 standard drink of alcohol    Types: 1 Glasses of wine per week    Comment: Occasionally   Drug use: No   Family History  Problem Relation Age of Onset   Hypertension Mother    Diabetes Mother    Sleep apnea Mother    Obesity Mother    Arthritis Mother    Hypertension Father    Diabetes Father    Heart disease Father    Kidney disease Father    Cancer Maternal Grandmother        ovarian   Cancer Maternal Uncle        lymphoma   Allergies  Allergen Reactions   Dust Mite Extract Cough and Itching   Fish Allergy Hives    *CATFISH*   Other     artificial sweeteners   Shrimp Extract Itching and Swelling   Sulfa Antibiotics Rash   Current Outpatient Medications on File Prior to Visit  Medication Sig Dispense Refill   albuterol (VENTOLIN HFA) 108 (90 Base) MCG/ACT inhaler Inhale 2 puffs into the lungs every 6 (six) hours as needed.     Azelastine HCl 137 MCG/SPRAY SOLN Place into both nostrils.     diclofenac (VOLTAREN) 75 MG EC tablet Take 1 tablet (75 mg total) by mouth 2 (two) times daily. 60 tablet 2   EPINEPHrine 0.3 mg/0.3 mL IJ SOAJ injection See admin instructions.     fluticasone (FLONASE) 50 MCG/ACT nasal spray Place into both nostrils.     levocetirizine (XYZAL) 5 MG tablet Take 2.5 mg by mouth.      methocarbamol (ROBAXIN) 500 MG tablet Take  1 tablet (500 mg total) by mouth every 8 (eight) hours as needed for muscle spasms. Caution of sedation 30 tablet 1   Multiple Vitamins-Minerals (MULTIVITAMIN GUMMIES ADULT PO) Take 2 each by mouth daily.     rosuvastatin (CRESTOR) 10 MG tablet TAKE ONE TABLET BY MOUTH ONE TIME DAILY 90 tablet 3   spironolactone (ALDACTONE) 50 MG tablet TAKE ONE TABLET BY MOUTH ONE TIME DAILY 90 tablet 3  tirzepatide (MOUNJARO) 7.5 MG/0.5ML Pen Inject 7.5 mg into the skin once a week. 2 mL 1   No current facility-administered medications on file prior to visit.    Review of Systems  Constitutional:  Negative for activity change, appetite change, fatigue, fever and unexpected weight change.  HENT:  Negative for congestion, ear pain, rhinorrhea, sinus pressure and sore throat.   Eyes:  Negative for pain, redness and visual disturbance.  Respiratory:  Negative for cough, shortness of breath and wheezing.   Cardiovascular:  Negative for chest pain and palpitations.  Gastrointestinal:  Negative for abdominal pain, blood in stool, constipation and diarrhea.  Endocrine: Negative for polydipsia and polyuria.  Genitourinary:  Negative for dysuria, frequency and urgency.  Musculoskeletal:  Negative for arthralgias, back pain and myalgias.       Neck pain radiating to shoulder on right   Skin:  Negative for pallor and rash.  Allergic/Immunologic: Negative for environmental allergies.  Neurological:  Negative for dizziness, syncope and headaches.  Hematological:  Negative for adenopathy. Does not bruise/bleed easily.  Psychiatric/Behavioral:  Negative for decreased concentration and dysphoric mood. The patient is not nervous/anxious.        Objective:   Physical Exam Constitutional:      General: She is not in acute distress.    Appearance: Normal appearance. She is well-developed. She is obese. She is not ill-appearing or diaphoretic.  HENT:     Head: Normocephalic and atraumatic.  Eyes:      Conjunctiva/sclera: Conjunctivae normal.     Pupils: Pupils are equal, round, and reactive to light.  Neck:     Thyroid: No thyromegaly.     Vascular: No carotid bruit or JVD.  Cardiovascular:     Rate and Rhythm: Normal rate and regular rhythm.     Heart sounds: Normal heart sounds.     No gallop.  Pulmonary:     Effort: Pulmonary effort is normal. No respiratory distress.     Breath sounds: Normal breath sounds. No wheezing or rales.  Abdominal:     General: There is no distension or abdominal bruit.     Palpations: Abdomen is soft.  Musculoskeletal:     Cervical back: Normal range of motion and neck supple.     Right lower leg: No edema.     Left lower leg: No edema.     Comments: Some right sided peri cervical tenderness/ also trapezius  No midline tenderness No crepitus or deformity  Worse with flex and right sided tilt and rotation   Lymphadenopathy:     Cervical: No cervical adenopathy.  Skin:    General: Skin is warm and dry.     Coloration: Skin is not jaundiced or pale.     Findings: No bruising or rash.  Neurological:     Mental Status: She is alert.     Sensory: No sensory deficit.     Motor: No weakness.     Coordination: Coordination normal.     Deep Tendon Reflexes: Reflexes are normal and symmetric. Reflexes normal.  Psychiatric:        Mood and Affect: Mood normal.           Assessment & Plan:   Problem List Items Addressed This Visit       Nervous and Auditory   Cervical radiculopathy - Primary   A year ago was on left  Reviewed imaging and also notes /plan from [facility] from 09/2022- she took meloxicam but never tid formal PT (  exercises at home improved symptoms)  Now on for past 2 weeks  -feels different but in similar distribution  Reassuring exam /no neuro deficits Prescription meloxicam 15 mg with food daily 2 weeks then prn  Work with pillow to find which works  Wants to do exercises at home before any formal PT  DDD on cervical  films          Other   Elevated platelet count   Hb 15.2 Plt 449  Check iron and ferritin today   In past- path review noted likely reactive   Lab today      Relevant Orders   CBC with Differential/Platelet (Completed)

## 2023-09-09 NOTE — Patient Instructions (Addendum)
 Take meloxicam with food daily for 2 weeks , then as needed If GI upset stop it and call  Use heat  Adjust pillow if possible Try the exercises you did last year with bands   PT may be option later   Labs today for platelets

## 2023-09-09 NOTE — Assessment & Plan Note (Addendum)
 A year ago was on left  Reviewed imaging and also notes /plan from [facility] from 09/2022- she took meloxicam but never tid formal PT (exercises at home improved symptoms)  Now on for past 2 weeks  -feels different but in similar distribution  Reassuring exam /no neuro deficits Prescription meloxicam 15 mg with food daily 2 weeks then prn  Work with pillow to find which works  Wants to do exercises at home before any formal PT  DDD on cervical films

## 2023-09-11 ENCOUNTER — Encounter: Payer: Self-pay | Admitting: Family Medicine

## 2023-09-11 NOTE — Addendum Note (Signed)
 Addended by: Roxy Manns A on: 09/11/2023 10:48 AM   Modules accepted: Orders

## 2023-09-11 NOTE — Assessment & Plan Note (Signed)
 Hb 15.2 Plt 449  Check iron and ferritin today   In past- path review noted likely reactive   Lab today

## 2023-09-13 ENCOUNTER — Ambulatory Visit: Admitting: Family Medicine

## 2023-09-15 ENCOUNTER — Encounter (INDEPENDENT_AMBULATORY_CARE_PROVIDER_SITE_OTHER): Payer: Self-pay | Admitting: Physician Assistant

## 2023-09-15 ENCOUNTER — Ambulatory Visit (INDEPENDENT_AMBULATORY_CARE_PROVIDER_SITE_OTHER): Payer: BC Managed Care – PPO | Admitting: Physician Assistant

## 2023-09-15 VITALS — BP 114/78 | HR 82 | Temp 97.5°F | Ht 67.0 in | Wt 240.0 lb

## 2023-09-15 DIAGNOSIS — E119 Type 2 diabetes mellitus without complications: Secondary | ICD-10-CM | POA: Diagnosis not present

## 2023-09-15 DIAGNOSIS — M5412 Radiculopathy, cervical region: Secondary | ICD-10-CM | POA: Diagnosis not present

## 2023-09-15 DIAGNOSIS — E669 Obesity, unspecified: Secondary | ICD-10-CM | POA: Diagnosis not present

## 2023-09-15 DIAGNOSIS — Z6837 Body mass index (BMI) 37.0-37.9, adult: Secondary | ICD-10-CM

## 2023-09-15 DIAGNOSIS — Z7985 Long-term (current) use of injectable non-insulin antidiabetic drugs: Secondary | ICD-10-CM

## 2023-09-15 DIAGNOSIS — E1165 Type 2 diabetes mellitus with hyperglycemia: Secondary | ICD-10-CM

## 2023-09-15 MED ORDER — TIRZEPATIDE 10 MG/0.5ML ~~LOC~~ SOAJ: 10.0000 mg | SUBCUTANEOUS | 2 refills | Status: DC

## 2023-09-15 NOTE — Progress Notes (Signed)
 SUBJECTIVE:  Chief Complaint: Obesity  Interim History: She is up 2 lbs over the past month.  She had influenza and has been dealing with a pinched nerve in her neck with resultant decrease in activities lately.   Ashley Mercado is here to discuss her progress with her obesity treatment plan. She is on the Category 2 Plan and states she is following her eating plan approximately 70 % of the time. She states she is exercising walking 30 minutes 3-4 times per week.  The patient is a 53 year old with obesity who presents for follow-up of her obesity treatment plan.  Down 14 lbs TBW loss 5.5% She has gained two pounds since her last visit, attributing this to a recent bout of influenza that limited her ability to exercise consistently. She is following a category two plan approximately 70% of the time and is walking for 30 minutes three to four times a week. We discussed increasing her Mounjaro dose from 7.5 mg once weekly, as she feels she has plateaued in her weight loss efforts. No side effects from [facility], such as constipation or vision changes, and she maintains her protein intake and avoids junk food.  She developed a pinched nerve in her neck, which has further limited her ability to exercise. She experiences numbness in her thumbs and difficulty lifting objects due to pain radiating from her neck to her arm. She has been prescribed medication for the pinched nerve and reports some improvement. She is also using Robaxin and Voltaren (diclofenac) for pain management. The pain worsened after a period of tightness.  She has a history of type two diabetes, hyperlipidemia, and hypertension. She is currently on Mounjaro 7.5 mg once weekly for diabetes management. She reports no side effects from [facility]- Denies mass in neck, dysphagia, dyspepsia, persistent hoarseness, abdominal pain, or N/V/Constipation or diarrhea. Has annual eye exam. Mood is stable.    Family history reveals that her father and  brother both had neck issues requiring surgery, and both were truck drivers. She wonders if there is a familial predisposition to neck problems. OBJECTIVE: Visit Diagnoses: Problem List Items Addressed This Visit     DM (diabetes mellitus), type 2 (HCC) - Primary   Relevant Medications   tirzepatide (MOUNJARO) 10 MG/0.5ML Pen   Generalized obesity- Start BMI 39.9   Relevant Medications   tirzepatide (MOUNJARO) 10 MG/0.5ML Pen   BMI 37.0-37.9, adult   Cervical radiculopathy   Obesity She is adhering to a category two obesity treatment plan 70% of the time. A two-pound weight gain since the last visit is attributed to influenza and cervical radiculopathy, limiting exercise. She walks 30 minutes three to four times weekly.  Mounjaro provides good appetite suppression, though weight loss has plateaued. Increasing Mounjaro to 10 mg is considered to enhance weight loss, especially given reduced activity due to her neck issue. - Continue current obesity treatment plan - Increase Mounjaro to 10 mg once weekly for one month to assess for further weight loss - Monitor for any side effects or lack of efficacy with increased Mounjaro dose  Type 2 Diabetes Mellitus She is on Mounjaro 7.5 mg once weekly for type 2 diabetes management for . No side effects reported, with good appetite suppression, though weight loss has plateaued. Increasing the dose to 10 mg is considered to enhance weight loss and improve diabetes management. Lab Results  Component Value Date   HGBA1C 5.9 03/01/2023   HGBA1C 5.6 07/26/2022   HGBA1C 5.5 01/14/2022  Lab Results  Component Value Date   MICROALBUR <0.7 03/01/2023   LDLCALC 69 03/01/2023   CREATININE 0.94 03/01/2023   INSULIN  Date Value Ref Range Status  07/26/2022 14.0 2.6 - 24.9 uIU/mL Final  ] A1c at goal. Insulin still slightly elevated.  - Increase/refill Mounjaro to 10 mg once weekly for one month - Monitor for any side effects or changes in appetite  or weight She is working  on nutrition plan to decrease simple carbohydrates, increase lean proteins and exercise to promote weight loss and improve glycemic control .  Cervical Radiculopathy She has cervical radicular symptoms causing numbness in her thumbs and difficulty lifting weights. Current medications include Robaxin and Voltaren, with some improvement reported. Symptoms may have been exacerbated by coughing during a recent influenza episode. Family history includes similar issues requiring surgery. Further evaluation is advised if symptoms persist to prevent chronic nerve damage, which could lead to loss of strength and sensation. - Encourage evaluation by orthopedics or neurosurgery if symptoms persist - Consider MRI if symptoms do not improve - Advise caution with physical activities to avoid exacerbating symptoms  Follow-up A follow-up appointment for her neck issue is scheduled in two weeks. Follow-up in one month is planned to assess response to increased Mounjaro dose. - Schedule follow-up appointment for neck issue in two weeks - Follow up in one month to assess response to increased Mounjaro dose  Vitals Temp: (!) 97.5 F (36.4 C) BP: 114/78 Pulse Rate: 82 SpO2: 98 %   Anthropometric Measurements Height: 5\' 7"  (1.702 m) Weight: 240 lb (108.9 kg) BMI (Calculated): 37.58 Weight at Last Visit: 238 lb Weight Lost Since Last Visit: 0 Weight Gained Since Last Visit: 2 lb Starting Weight: 254 lb Total Weight Loss (lbs): 14 lb (6.35 kg) Peak Weight: 254 lb   Body Composition  Body Fat %: 46.2 % Fat Mass (lbs): 110.8 lbs Muscle Mass (lbs): 122.6 lbs Total Body Water (lbs): 86.2 lbs Visceral Fat Rating : 13   Other Clinical Data Fasting: yes Labs: yes Today's Visit #: 29 Starting Date: 09/10/21     ASSESSMENT AND PLAN:  Diet: Alisha is currently in the action stage of change. As such, her goal is to continue with weight loss efforts and has agreed to the  Category 2 Plan.   Exercise:  Exercise to tolerance due to cervical radicular symptoms  Behavior Modification:  We discussed the following Behavioral Modification Strategies today: increasing lean protein intake, decreasing simple carbohydrates, increasing vegetables, increase H2O intake, increase high fiber foods, no skipping meals, avoiding temptations, and planning for success. We discussed various medication options to help Eisha with her weight loss efforts and we both agreed to increase Mounjaro to 10 mg weekly for the next month and monitor response closely and continue to work on nutritional and behavioral strategies to promote weight loss.  .  Return in about 4 weeks (around 10/13/2023).Marland Kitchen She was informed of the importance of frequent follow up visits to maximize her success with intensive lifestyle modifications for her multiple health conditions.  Attestation Statements:   Reviewed by clinician on day of visit: allergies, medications, problem list, medical history, surgical history, family history, social history, and previous encounter notes.   Time spent on visit including pre-visit chart review and post-visit care and charting was 29 minutes  Benino Korinek,PA-C

## 2023-10-06 ENCOUNTER — Ambulatory Visit (INDEPENDENT_AMBULATORY_CARE_PROVIDER_SITE_OTHER): Admitting: Physician Assistant

## 2023-10-06 ENCOUNTER — Encounter (INDEPENDENT_AMBULATORY_CARE_PROVIDER_SITE_OTHER): Payer: Self-pay | Admitting: Physician Assistant

## 2023-10-06 VITALS — BP 112/77 | HR 82 | Temp 97.9°F | Ht 67.0 in | Wt 239.0 lb

## 2023-10-06 DIAGNOSIS — E785 Hyperlipidemia, unspecified: Secondary | ICD-10-CM

## 2023-10-06 DIAGNOSIS — E1165 Type 2 diabetes mellitus with hyperglycemia: Secondary | ICD-10-CM

## 2023-10-06 DIAGNOSIS — E1169 Type 2 diabetes mellitus with other specified complication: Secondary | ICD-10-CM

## 2023-10-06 DIAGNOSIS — I152 Hypertension secondary to endocrine disorders: Secondary | ICD-10-CM

## 2023-10-06 DIAGNOSIS — R7989 Other specified abnormal findings of blood chemistry: Secondary | ICD-10-CM | POA: Diagnosis not present

## 2023-10-06 DIAGNOSIS — Z6837 Body mass index (BMI) 37.0-37.9, adult: Secondary | ICD-10-CM

## 2023-10-06 DIAGNOSIS — E669 Obesity, unspecified: Secondary | ICD-10-CM

## 2023-10-06 DIAGNOSIS — D75839 Thrombocytosis, unspecified: Secondary | ICD-10-CM

## 2023-10-06 DIAGNOSIS — Z7985 Long-term (current) use of injectable non-insulin antidiabetic drugs: Secondary | ICD-10-CM

## 2023-10-06 MED ORDER — TIRZEPATIDE 10 MG/0.5ML ~~LOC~~ SOAJ: 10.0000 mg | SUBCUTANEOUS | 2 refills | Status: DC

## 2023-10-06 NOTE — Progress Notes (Unsigned)
 SUBJECTIVE: Discussed the use of AI scribe software for clinical note transcription with the patient, who gave verbal consent to proceed.  Chief Complaint: Obesity  Interim History: She is down 1 lb since last visit. Down 15 lbs overall TBW loss of 6.3%  Ashley Mercado is here to discuss her progress with her obesity treatment plan. She is on the Category 2 Plan and states she is following her eating plan approximately 70 % of the time. She states she is exercising walking for 20 minutes 3 times per week.  Ashley Mercado is a 53 year old female with obesity who presents for follow-up of her obesity treatment plan.  She is currently on Mounjaro 10 mg weekly for obesity management. Since her last visit, she has experienced a weight loss of one pound. She is actively working on her weight management and is incorporating healthier lifestyle choices.  Her diet now includes more fruits such as strawberries, blueberries, and a small amount of crushed pineapple, often combined with [place] yogurt and a small portion of [employer]. She is cautious about sugar intake from granola and keeps portions small. For lunch, she typically consumes hamburger patties with onions and ketchup, accompanied by vegetables or a side salad. Dinner is often a smaller meal, especially when her husband is out of town, and she is considering options like [employer] prepared meals for convenience.  She has increased her muscle mass by 2.2 pounds, attributing this to walking at least three days a week and increased physical activity in her yard, including gardening tasks.  She is on Crestor 10 mg daily for hyperlipidemia. There is no specific discussion of her lipid levels or any changes in her management during this visit.  No nausea, vomiting, constipation, diarrhea, difficulty swallowing, or vision changes while on Mounjaro. She also reports no mood changes. OBJECTIVE: Visit Diagnoses: Problem List Items Addressed This  Visit     DM (diabetes mellitus), type 2 (HCC) - Primary   Relevant Medications   tirzepatide (MOUNJARO) 10 MG/0.5ML Pen   Hyperlipidemia associated with type 2 diabetes mellitus (HCC)   Relevant Medications   tirzepatide (MOUNJARO) 10 MG/0.5ML Pen   Generalized obesity- Start BMI 39.9   Relevant Medications   tirzepatide (MOUNJARO) 10 MG/0.5ML Pen   Other Visit Diagnoses       Hypertension associated with diabetes (HCC)       Relevant Medications   tirzepatide (MOUNJARO) 10 MG/0.5ML Pen      Obesity Managed with Mounjaro 10 mg weekly, resulting in a one-pound weight loss and a 2.2-pound increase in muscle mass. She is adopting healthier dietary habits and engaging in regular physical activity, including walking and yard work. No side effects from [facility], such as nausea, vomiting, or mood changes, have been reported. - Continue Mounjaro 10 mg weekly - Encourage healthy eating habits and portion control - Encourage regular physical activity   Type 2 Diabetes Mellitus Currently managed with Mounjaro 10 mg weekly, which aids in glycemic control and weight management. She reports no side effects such as nausea, vomiting, constipation, diarrhea, difficulty swallowing, or vision changes. - Continue Mounjaro 10 mg weekly - Monitor for side effects, including mood and vision changes   Hyperlipidemia Managed with Crestor 10 mg daily. Lipid levels were not specifically discussed, but ongoing management is implied. - Continue Crestor 10 mg daily  Thrombocytosis Persistent elevated platelet counts since a previous foot issue, with no current aspirin therapy. Foot pain may contribute to thrombocytosis. Monitoring platelet levels is prioritized  over initiating aspirin therapy based on clinical evaluation. - Monitor platelet levels - Consider aspirin therapy if recommended by hematology  Follow-up Requires follow-up for ongoing management and monitoring of treatment response. -  Schedule fasting labs for May 6th at 7 AM - Schedule follow-up appointment for June 2nd at 7:30 AM  Vitals Temp: 97.9 F (36.6 C) BP: 112/77 Pulse Rate: 82 SpO2: 100 %   Anthropometric Measurements Height: 5\' 7"  (1.702 m) Weight: 239 lb (108.4 kg) BMI (Calculated): 37.42 Weight at Last Visit: 240 lb Weight Lost Since Last Visit: 1 lb Weight Gained Since Last Visit: 0 Starting Weight: 239 lb Total Weight Loss (lbs): 15 lb (6.804 kg) Peak Weight: 254 lb   Body Composition  Body Fat %: 45.1 % Fat Mass (lbs): 108 lbs Muscle Mass (lbs): 124.8 lbs Total Body Water (lbs): 85 lbs Visceral Fat Rating : 13   Other Clinical Data Fasting: no Labs: no Today's Visit #: 30 Starting Date: 09/10/21     ASSESSMENT AND PLAN:  Diet: Makinlee is currently in the action stage of change. As such, her goal is to continue with weight loss efforts. She has agreed to Category 2 Plan.  Exercise: Keondria has been instructed to work up to a goal of 150 minutes of combined cardio and strengthening exercise per week for weight loss and overall health benefits.   Behavior Modification:  We discussed the following Behavioral Modification Strategies today: increasing lean protein intake, decreasing simple carbohydrates, increasing vegetables, increase H2O intake, increase high fiber foods, meal planning and cooking strategies, avoiding temptations, and planning for success. We discussed various medication options to help Jari with her weight loss efforts and we both agreed to continue Mounjaro 10 mg weekly for Type 2 diabetes and continue to work on nutritional and behavioral strategies to promote weight loss.  .  Return in about 4 weeks (around 11/03/2023).Marland Kitchen She was informed of the importance of frequent follow up visits to maximize her success with intensive lifestyle modifications for her multiple health conditions.  Attestation Statements:   Reviewed by clinician on day of visit: allergies,  medications, problem list, medical history, surgical history, family history, social history, and previous encounter notes.   Time spent on visit including pre-visit chart review and post-visit care and charting was *** minutes.    Taariq Leitz, PA-C

## 2023-11-07 NOTE — Progress Notes (Unsigned)
 SUBJECTIVE: Discussed the use of AI scribe software for clinical note transcription with the patient, who gave verbal consent to proceed.  Chief Complaint: Obesity  Interim History: She is up 5 lbs since last visit.   Ashley Mercado is here to discuss her progress with her obesity treatment plan. She is on the Category 2 Plan and states she is following her eating plan approximately 50 % of the time. She states she is exercising walking and biking 60 minutes 5 times per week.  Ashley Mercado is a 53 year old female with obesity who presents for a follow-up on her obesity treatment plan.  She recently returned from a trip where she faced challenges in maintaining her dietary plan due to limited food options suitable for her allergies. During the trip, she noted an increase in adipose tissue but also gained almost a pound of muscle due to increased physical activity, including walking and biking.  She feels more tired than before the trip and has had difficulty sleeping due to noise disturbances during her vacation. She describes herself as a very light sleeper, which was problematic during her trip due to noise from nearby campers. She found some relief by using the air conditioning unit as white noise. At home, she typically goes to bed early due to her early morning schedule.  She has been on 10 mg of Mounjaro for two cycles, but does not feel the same level of hunger and craving suppression as before. She feels inclined to eat when she sees food, which may have been exacerbated by the recent trip.  Her past medical history includes type 2 diabetes, hyperlipidemia, hypertension, low serum B12, and vitamin D  deficiency. She is currently fasting for lab work to check her chemistries, A1c, insulin , lipids, vitamin D , and B12 levels. She recalls a previous instance of high platelets due to an infection in her foot, which she believes might still be present.  Fasting labs were obtained today.  The patient  was informed we would discuss the lab results at the next visit unless there is a critical issue that needs to be addressed sooner. The patient agreed to keep the next visit at the agreed upon time to discuss these results.   OBJECTIVE: Visit Diagnoses: Problem List Items Addressed This Visit     DM (diabetes mellitus), type 2 (HCC) - Primary   Relevant Medications   tirzepatide (MOUNJARO) 10 MG/0.5ML Pen   Other Relevant Orders   CMP14+EGFR   Insulin , random   Hemoglobin A1c   Vitamin D  deficiency   Relevant Orders   VITAMIN D  25 Hydroxy (Vit-D Deficiency, Fractures)   Low serum vitamin B12   Relevant Orders   Vitamin B12   Hyperlipidemia associated with type 2 diabetes mellitus (HCC)   Relevant Medications   tirzepatide (MOUNJARO) 10 MG/0.5ML Pen   Other Relevant Orders   Lipid Panel With LDL/HDL Ratio   Generalized obesity- Start BMI 39.9   Relevant Medications   tirzepatide (MOUNJARO) 10 MG/0.5ML Pen   Other Visit Diagnoses       BMI 38.0-38.9,adult Current BMI 38.2         Obesity Obesity management is ongoing. She has been on 10 mg of Mounjaro for two cycles. Despite recent dietary challenges during a trip, she has gained muscle mass, but also adipose .  She reports decreased hunger and craving suppression with the current dose, but was not able to fully eat on plan due to recent vacation with limited options for meals.  The plan is to continue the current dose for another month to assess its effectiveness in a stable environment. - Continue 10 mg Mounjaro for another month - Focus on protein intake to control hunger  Type 2 Diabetes Mellitus with hyperglycemia, without long-term current use of insulin  HgbA1c is at goal. Last A1c was 5.9 Medication(s): Mounjaro 10 mg SQ weekly  Denies mass in neck, dysphagia, dyspepsia, persistent hoarseness, abdominal pain, or N/V/Constipation or diarrhea. Has annual eye exam. Mood is stable.   Lab Results  Component Value Date    HGBA1C 5.9 03/01/2023   HGBA1C 5.6 07/26/2022   HGBA1C 5.5 01/14/2022   Lab Results  Component Value Date   MICROALBUR <0.7 03/01/2023   LDLCALC 69 03/01/2023   CREATININE 0.94 03/01/2023   Lab Results  Component Value Date   GFR 68.88 03/01/2023   GFR 71.18 02/26/2022   GFR 67.30 02/02/2021    Plan: Continue and refill Mounjaro 10 mg SQ weekly She is working  on nutrition plan to decrease simple carbohydrates, increase lean proteins and exercise to promote weight loss and improve glycemic control . Recheck fasting labs today.   Hyperlipidemia LDL is not at goal. Trig 88/HDL 53- at goal Medication(s): Crestor  10 mg daily. No SE Cardiovascular risk factors: diabetes mellitus, dyslipidemia, hypertension, and obesity (BMI >= 30 kg/m2)  Lab Results  Component Value Date   CHOL 139 03/01/2023   HDL 53.10 03/01/2023   LDLCALC 69 03/01/2023   TRIG 88.0 03/01/2023   CHOLHDL 3 03/01/2023   CHOLHDL 2 04/23/2022   CHOLHDL 4 02/26/2022   Lab Results  Component Value Date   ALT 31 03/01/2023   AST 24 03/01/2023   ALKPHOS 106 03/01/2023   BILITOT 0.7 03/01/2023   The 10-year ASCVD risk score (Arnett DK, et al., 2019) is: 4.5%   Values used to calculate the score:     Age: 83 years     Sex: Female     Is Non-Hispanic African American: Yes     Diabetic: Yes     Tobacco smoker: No     Systolic Blood Pressure: 106 mmHg     Is BP treated: Yes     HDL Cholesterol: 53.1 mg/dL     Total Cholesterol: 139 mg/dL  Plan: Continue statin. Continue Mounjaro Continue to work on Engineer, technical sales -decreasing simple carbohydrates, increasing lean proteins, decreasing saturated fats and cholesterol , avoiding trans fats and exercise as able to promote weight loss, improve lipids and decrease cardiovascular risks. - Order fasting lipid panel   Vitamin D  Deficiency Vitamin D  is not at goal of 50.  Most recent vitamin D  level was 46.32. She is on MVI . Lab Results  Component Value Date    VD25OH 46.32 03/01/2023   VD25OH 77.4 04/26/2022   VD25OH 61.4 01/14/2022    Plan:  Recheck vitamin D  level and supplement accordingly if indicated. Low vitamin D  levels can be associated with adiposity and may result in leptin resistance and weight gain. Also associated with fatigue.  Currently on vitamin D  supplementation without any adverse effects such as nausea, vomiting or muscle weakness.  Recheck vitamin D  today.    Low B12 Endorses fatigue. B12 levels have not been checked recently. Lab Results  Component Value Date   VITAMINB12 314 03/01/2023    - Order B12 level and supplement accordingly.   Vitals Temp: 97.7 F (36.5 C) BP: 106/72 Pulse Rate: 94 SpO2: 99 %   Anthropometric Measurements Height: 5\' 7"  (1.702 m)  Weight: 244 lb (110.7 kg) BMI (Calculated): 38.21 Weight at Last Visit: 239 lb Weight Lost Since Last Visit: 0 Weight Gained Since Last Visit: 5 lb Starting Weight: 239 lb Total Weight Loss (lbs): 10 lb (4.536 kg) Peak Weight: 254 lb   Body Composition  Body Fat %: 45.8 % Fat Mass (lbs): 111.8 lbs Muscle Mass (lbs): 125.6 lbs Total Body Water (lbs): 85 lbs Visceral Fat Rating : 13   Other Clinical Data Fasting: yes Labs: no Today's Visit #: 31 Starting Date: 09/10/21     ASSESSMENT AND PLAN:  Diet: Ashley Mercado is currently in the action stage of change. As such, her goal is to continue with weight loss efforts. She has agreed to Category 2 Plan.  Exercise: Ashley Mercado has been instructed to work up to a goal of 150 minutes of combined cardio and strengthening exercise per week for weight loss and overall health benefits.   Behavior Modification:  We discussed the following Behavioral Modification Strategies today: increasing lean protein intake, decreasing simple carbohydrates, increasing vegetables, increase H2O intake, increase high fiber foods, no skipping meals, meal planning and cooking strategies, avoiding temptations, and planning for  success. We discussed various medication options to help Ashley Mercado with her weight loss efforts and we both agreed to continue current treatment plan and continue to work on nutritional and behavioral strategies to promote weight loss.  .  Return in about 4 weeks (around 12/06/2023).Ashley Mercado She was informed of the importance of frequent follow up visits to maximize her success with intensive lifestyle modifications for her multiple health conditions.  Attestation Statements:   Reviewed by clinician on day of visit: allergies, medications, problem list, medical history, surgical history, family history, social history, and previous encounter notes.   Time spent on visit including pre-visit chart review and post-visit care and charting was 28 minutes.    Adrena Nakamura, PA-C

## 2023-11-08 ENCOUNTER — Ambulatory Visit (INDEPENDENT_AMBULATORY_CARE_PROVIDER_SITE_OTHER): Admitting: Physician Assistant

## 2023-11-08 ENCOUNTER — Encounter (INDEPENDENT_AMBULATORY_CARE_PROVIDER_SITE_OTHER): Payer: Self-pay | Admitting: Physician Assistant

## 2023-11-08 VITALS — BP 106/72 | HR 94 | Temp 97.7°F | Ht 67.0 in | Wt 244.0 lb

## 2023-11-08 DIAGNOSIS — E785 Hyperlipidemia, unspecified: Secondary | ICD-10-CM | POA: Diagnosis not present

## 2023-11-08 DIAGNOSIS — E559 Vitamin D deficiency, unspecified: Secondary | ICD-10-CM

## 2023-11-08 DIAGNOSIS — E1169 Type 2 diabetes mellitus with other specified complication: Secondary | ICD-10-CM

## 2023-11-08 DIAGNOSIS — E1165 Type 2 diabetes mellitus with hyperglycemia: Secondary | ICD-10-CM | POA: Diagnosis not present

## 2023-11-08 DIAGNOSIS — Z6838 Body mass index (BMI) 38.0-38.9, adult: Secondary | ICD-10-CM

## 2023-11-08 DIAGNOSIS — E669 Obesity, unspecified: Secondary | ICD-10-CM

## 2023-11-08 DIAGNOSIS — E538 Deficiency of other specified B group vitamins: Secondary | ICD-10-CM

## 2023-11-08 DIAGNOSIS — Z7985 Long-term (current) use of injectable non-insulin antidiabetic drugs: Secondary | ICD-10-CM

## 2023-11-08 MED ORDER — TIRZEPATIDE 10 MG/0.5ML ~~LOC~~ SOAJ: 10.0000 mg | SUBCUTANEOUS | 2 refills | Status: DC

## 2023-11-09 LAB — HEMOGLOBIN A1C
Est. average glucose Bld gHb Est-mCnc: 120 mg/dL
Hgb A1c MFr Bld: 5.8 % — ABNORMAL HIGH (ref 4.8–5.6)

## 2023-11-09 LAB — LIPID PANEL WITH LDL/HDL RATIO
Cholesterol, Total: 142 mg/dL (ref 100–199)
HDL: 53 mg/dL (ref >39)
LDL Chol Calc (NIH): 74 mg/dL (ref 0–99)
LDL/HDL Ratio: 1.4 ratio (ref 0.0–3.2)
Triglycerides: 79 mg/dL (ref 0–149)
VLDL Cholesterol Cal: 15 mg/dL (ref 5–40)

## 2023-11-09 LAB — CMP14+EGFR
ALT: 32 IU/L (ref 0–32)
AST: 25 IU/L (ref 0–40)
Albumin: 4.5 g/dL (ref 3.8–4.9)
Alkaline Phosphatase: 134 IU/L — ABNORMAL HIGH (ref 44–121)
BUN/Creatinine Ratio: 19 (ref 9–23)
BUN: 15 mg/dL (ref 6–24)
Bilirubin Total: 0.3 mg/dL (ref 0.0–1.2)
CO2: 23 mmol/L (ref 20–29)
Calcium: 9.7 mg/dL (ref 8.7–10.2)
Chloride: 99 mmol/L (ref 96–106)
Creatinine, Ser: 0.78 mg/dL (ref 0.57–1.00)
Globulin, Total: 3.5 g/dL (ref 1.5–4.5)
Glucose: 95 mg/dL (ref 70–99)
Potassium: 4.8 mmol/L (ref 3.5–5.2)
Sodium: 138 mmol/L (ref 134–144)
Total Protein: 8 g/dL (ref 6.0–8.5)
eGFR: 90 mL/min/{1.73_m2} (ref >59)

## 2023-11-09 LAB — VITAMIN B12: Vitamin B-12: 1523 pg/mL — ABNORMAL HIGH (ref 232–1245)

## 2023-11-09 LAB — VITAMIN D 25 HYDROXY (VIT D DEFICIENCY, FRACTURES): Vit D, 25-Hydroxy: 56.8 ng/mL (ref 30.0–100.0)

## 2023-11-09 LAB — INSULIN, RANDOM: INSULIN: 18.5 u[IU]/mL (ref 2.6–24.9)

## 2023-12-05 NOTE — Progress Notes (Signed)
 SUBJECTIVE: Discussed the use of AI scribe software for clinical note transcription with the patient, who gave verbal consent to proceed.  Chief Complaint: Obesity  Interim History: She is up 3 lbs since last visit.   Ashley Mercado is here to discuss her progress with her obesity treatment plan. She is on the Category 2 Plan and states she is following her eating plan approximately 50 % of the time. She states she is exercising 30-40 minutes 3 times per week by walking.  Ashley Mercado is a 53 year old female with obesity who presents for follow-up of her obesity treatment plan.  She is following a category two diet and adheres to the nutrition plan approximately fifty percent of the time. Her weight has increased by three pounds since her last visit.  She is currently on Mounjaro 10 mg weekly for type two diabetes management and has been on this dose for three months. She is also taking rosuvastatin  10 mg daily for hypercholesterolemia.  She presents with concerns about appetite and craving management.  She experiences late-night cravings, particularly for sweet foods, despite finishing dinner around 6 PM. They attempt to manage these cravings with dark chocolate, keeping it under 150 calories, and sometimes opt for string cheese or water to see if the craving persists. They want to better manage these cravings.  She has been eating meals like Ashley Mercado's with salad, egg, corn, and onions, and they prefer blending fruits rather than eating them whole. She is not sleeping well and want to improve their sleep quality. She also reports a history of migraine headaches.   Her social history includes being actively involved in meal planning and preparation, growing strawberries and blueberries, and managing household responsibilities, including caring for her nephew. Her husband works on a project in [place], which Ashley Mercado.  OBJECTIVE: Visit Diagnoses: Problem List Items  Addressed This Visit     DM (diabetes mellitus), type 2 (HCC) - Primary   Relevant Medications   tirzepatide (MOUNJARO) 10 MG/0.5ML Pen   Vitamin D  deficiency   Low serum vitamin B12   Hyperlipidemia associated with type 2 diabetes mellitus (HCC)   Relevant Medications   tirzepatide (MOUNJARO) 10 MG/0.5ML Pen   Generalized obesity- Start BMI 39.9   Relevant Medications   tirzepatide (MOUNJARO) 10 MG/0.5ML Pen   Other Visit Diagnoses       Migraine without status migrainosus, not intractable, unspecified migraine type       Relevant Medications   topiramate (TOPAMAX) 25 MG tablet     Sleep disturbance         BMI 38.0-38.9,adult Current BMI 38.8          Obesity She follows a category two diet and nutrition plan approximately 50% of the time. Her weight has increased by three pounds since the last visit. The current management plan includes lifestyle modifications and pharmacotherapy. - Continue category two diet and nutrition plan - Encourage adherence to diet and nutrition plan - Monitor weight changes  Type 2 Diabetes Mellitus She is on Mounjaro 10 mg weekly for management. The effectiveness of the current regimen should be evaluated in the context of her weight management and overall glycemic control. - Continue Mounjaro 10 mg weekly - Evaluate glycemic control and adjust treatment as necessary   Hyperlipidemia She is on rosuvastatin  10 mg daily for hypercholesterolemia. The current treatment plan aims to manage lipid levels effectively. - Continue rosuvastatin  10 mg daily - Monitor lipid levels  Vitamin B12  and Vitamin D  Deficiency She requires supplementation and monitoring of vitamin B12 and vitamin D  levels. - Ensure adequate supplementation of vitamin B12 and vitamin D  - Monitor vitamin levels  Migraine prophylaxis Occasional migraines reported. Topamax is frequently used for migraine prophylaxis and may provide additional benefit in this regard. Prescribing  under this indication and may help with cravings for sweets as well.  She reports persistent cravings for sweets, particularly in the evenings, despite current use of Mounjaro. Consideration of increasing Mounjaro dosage or adding topamax to address cravings. Discussion of Topamax (topiramate) as an option.  Patient denies history of glaucoma or kidney stones. She was informed of side effects and that topiramate is teratogenic. She is postmenopausal.  - Topamax may also help with sweet cravings and improve sleep quality. - Initiate Topamax 25 mg in the evening, potentially improving sleep quality and reducing cravings as well as prophylaxis for migraine HA. . - Increase Topamax to 50 mg after one week if well tolerated. - Discuss potential side effects of Topamax, including tingling sensations and altered taste of carbonated beverages. - Encourage use of protein-based snacks and mindful eating strategies to manage cravings.  Sleep disturbance Reports of inadequate sleep. Topamax may improve sleep quality as a secondary benefit while addressing cravings.    Vitals Temp: 97.8 F (36.6 C) BP: 120/83 Pulse Rate: 63 SpO2: 100 %   Anthropometric Measurements Height: 5\' 7"  (1.702 m) Weight: 247 lb (112 kg) BMI (Calculated): 38.68 Weight at Last Visit: 244 lb Weight Lost Since Last Visit: 0 Weight Gained Since Last Visit: 3 lb Starting Weight: 239 lb Total Weight Loss (lbs): 7 lb (3.175 kg) Peak Weight: 254 lb   Body Composition  Body Fat %: 45.5 % Fat Mass (lbs): 112.6 lbs Muscle Mass (lbs): 128.2 lbs Total Body Water (lbs): 86.6 lbs Visceral Fat Rating : 14   Other Clinical Data Fasting: No Labs: No Today's Visit #: 32 Starting Date: 09/10/21     ASSESSMENT AND PLAN:  Diet: Ashley Mercado is currently in the action stage of change. As such, her goal is to continue with weight loss efforts. She has agreed to Category 2 Plan.  Exercise: Ashley Mercado has been instructed to work up  to a goal of 150 minutes of combined cardio and strengthening exercise per week for weight loss and overall health benefits.   Behavior Modification:  We discussed the following Behavioral Modification Strategies today: increasing lean protein intake, decreasing simple carbohydrates, increasing vegetables, increase H2O intake, increase high fiber foods, no skipping meals, meal planning and cooking strategies, celebration eating strategies, avoiding temptations, and planning for success. We discussed various medication options to help Ashley Mercado with her weight loss efforts and we both agreed to start topamax for primary indication of migraines and continue other current treatment plan, continue to work on nutritional and behavioral strategies to promote weight loss.  .  Return in about 4 weeks (around 01/03/2024).Ashley Mercado She was informed of the importance of frequent follow up visits to maximize her success with intensive lifestyle modifications for her multiple health conditions.  Attestation Statements:   Reviewed by clinician on day of visit: allergies, medications, problem list, medical history, surgical history, family history, social history, and previous encounter notes.   Time spent on visit including pre-visit chart review and post-visit care and charting was 35 minutes.    Shadiamond Koska, PA-C

## 2023-12-06 ENCOUNTER — Encounter (INDEPENDENT_AMBULATORY_CARE_PROVIDER_SITE_OTHER): Payer: Self-pay | Admitting: Physician Assistant

## 2023-12-06 ENCOUNTER — Ambulatory Visit (INDEPENDENT_AMBULATORY_CARE_PROVIDER_SITE_OTHER): Admitting: Physician Assistant

## 2023-12-06 VITALS — BP 120/83 | HR 63 | Temp 97.8°F | Ht 67.0 in | Wt 247.0 lb

## 2023-12-06 DIAGNOSIS — E669 Obesity, unspecified: Secondary | ICD-10-CM

## 2023-12-06 DIAGNOSIS — G479 Sleep disorder, unspecified: Secondary | ICD-10-CM

## 2023-12-06 DIAGNOSIS — E559 Vitamin D deficiency, unspecified: Secondary | ICD-10-CM

## 2023-12-06 DIAGNOSIS — E538 Deficiency of other specified B group vitamins: Secondary | ICD-10-CM

## 2023-12-06 DIAGNOSIS — E1165 Type 2 diabetes mellitus with hyperglycemia: Secondary | ICD-10-CM

## 2023-12-06 DIAGNOSIS — Z7985 Long-term (current) use of injectable non-insulin antidiabetic drugs: Secondary | ICD-10-CM

## 2023-12-06 DIAGNOSIS — E785 Hyperlipidemia, unspecified: Secondary | ICD-10-CM | POA: Diagnosis not present

## 2023-12-06 DIAGNOSIS — E1169 Type 2 diabetes mellitus with other specified complication: Secondary | ICD-10-CM

## 2023-12-06 DIAGNOSIS — Z6838 Body mass index (BMI) 38.0-38.9, adult: Secondary | ICD-10-CM

## 2023-12-06 DIAGNOSIS — G43909 Migraine, unspecified, not intractable, without status migrainosus: Secondary | ICD-10-CM

## 2023-12-06 MED ORDER — TOPIRAMATE 25 MG PO TABS: 25.0000 mg | ORAL_TABLET | Freq: Every evening | ORAL | 0 refills | Status: DC

## 2023-12-06 MED ORDER — TIRZEPATIDE 10 MG/0.5ML ~~LOC~~ SOAJ: 10.0000 mg | SUBCUTANEOUS | 2 refills | Status: DC

## 2024-01-09 NOTE — Progress Notes (Unsigned)
 SUBJECTIVE: Discussed the use of AI scribe software for clinical note transcription with the patient, who gave verbal consent to proceed.  Chief Complaint: Obesity  Interim History: She is down 9 lbs since her last visit.  Down 16 lbs overall TBW loss of 6.3%  Ashley Mercado is here to discuss her progress with her obesity treatment plan. She is on the Category 2 Plan and states she is following her eating plan approximately 80 % of the time. She states she is exercising walking 30-40 minutes 3-4 times per week.  Ashley Mercado is a 53 year old female with obesity who presents for follow-up of her obesity treatment plan.  She has lost nine pounds since her last visit. She is on Mounjaro  and  she has not experienced nausea, vomiting, or constipation or other side effects. Her appetite is suppressed, leading to skipped meals, particularly breakfast. She does try to get in her protein and overall calories during the rest of the day.   She started topamax  last month and notes few to no headaches since starting the medication. No SE.  She notes a change in taste, especially for sweet foods, since starting Topamax , resulting in decreased consumption of sweet foods like dried apples and cheesecake.  She is currently taking Topamax  at a dose of 50 mg, two tablets, and Mounjaro  at a dose of 10 mg. Topamax  has altered her taste for sweet foods and beverages, making them less palatable.  She has not experienced recent migraines or headaches, despite weather changes that typically trigger them. Her neck and hand symptoms, including numbness, have improved, with no recent occurrences.  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 her [employer]. She is also preparing to move her daughter to a new place.   Pharmacotherapy: Mounjaro  10 mg currently- Denies mass in neck, dysphagia,  dyspepsia, persistent hoarseness, abdominal pain, or N/V/Constipation or diarrhea. Has annual eye exam. Mood is stable.       Topamax  50 mg nightly- no SE, helps with sleep and HA    Ozempic - stopped 05/05/23-   Last labs 11/28/23 F/U with PCP 9/10 for AWV OBJECTIVE: Visit Diagnoses: Problem List Items Addressed This Visit     DM (diabetes mellitus), type 2 (HCC) - Primary   Relevant Medications   tirzepatide  (MOUNJARO ) 10 MG/0.5ML Pen   Hyperlipidemia associated with type 2 diabetes mellitus (HCC)   Relevant Medications   tirzepatide  (MOUNJARO ) 10 MG/0.5ML Pen   Generalized obesity- Start BMI 39.9   Relevant Medications   tirzepatide  (MOUNJARO ) 10 MG/0.5ML Pen   BMI 37.0-37.9, adult   Other Visit Diagnoses       Migraine without status migrainosus, not intractable, unspecified migraine type       Relevant Medications   topiramate  (TOPAMAX ) 25 MG tablet     Obesity Ashley Mercado has had a weight loss of nine pounds, attributed to Mounjaro  and Topiramate , which have effectively reduced her appetite and altered her taste for sweet foods. She has not experienced significant side effects such as nausea, vomiting, or constipation. Denies mass in neck, dysphagia, dyspepsia, persistent hoarseness, abdominal pain, or N/V/Constipation or diarrhea. Has annual eye exam. Mood is stable.  Her visceral adipose rating has decreased to thirteen while maintaining muscle mass. - Continue/refill Mounjaro  at 10 mg - Continue/refill Topiramate  at 50 mg - Monitor for any side effects and adjust Topiramate  dosage if necessary Meds ordered  this encounter  Medications   tirzepatide  (MOUNJARO ) 10 MG/0.5ML Pen    Sig: Inject 10 mg into the skin once a week.    Dispense:  2 mL    Refill:  2   topiramate  (TOPAMAX ) 25 MG tablet    Sig: Take 2 tablets (50 mg total) by mouth every evening.    Dispense:  60 tablet    Refill:  1    Migraine Topiramate  is effectively managing her migraines, with no  episodes reported even during recent weather changes that typically trigger them. - Continue Topiramate  at 50 mg for migraine prevention and is helping with cravings for sweets as well.   Type 2 Diabetes Mellitus with hyperglycemia, without long-term current use of insulin  HgbA1c is at goal. Last A1c was 5.8  On Statin therapy  No ACE or ARB Medication(s): Mounjaro  10 mg SQ weekly Denies mass in neck, dysphagia, dyspepsia, persistent hoarseness, abdominal pain, or N/V/Constipation or diarrhea. Has annual eye exam. Mood is stable.   Lab Results  Component Value Date   HGBA1C 5.8 (H) 11/08/2023   HGBA1C 5.9 03/01/2023   HGBA1C 5.6 07/26/2022   Lab Results  Component Value Date   LDLCALC 74 11/08/2023   CREATININE 0.78 11/08/2023   Lab Results  Component Value Date   GFR 68.88 03/01/2023   GFR 71.18 02/26/2022   GFR 67.30 02/02/2021    Plan: Continue and refill Mounjaro  10 mg SQ weekly She is working  on nutrition plan to decrease simple carbohydrates, increase lean proteins and exercise to promote weight loss and improve glycemic control .  Hyperlipidemia LDL is not at goal of < 55. HDL 53- at goal. Trig 79- at goal.  Medication(s): Crestor  10 mg daily, Mounjaro  10 mg weekly Cardiovascular risk factors: diabetes mellitus, dyslipidemia, hypertension, obesity (BMI >= 30 kg/m2), and sedentary lifestyle  Lab Results  Component Value Date   CHOL 142 11/08/2023   HDL 53 11/08/2023   LDLCALC 74 11/08/2023   TRIG 79 11/08/2023   CHOLHDL 3 03/01/2023   CHOLHDL 2 04/23/2022   CHOLHDL 4 02/26/2022   Lab Results  Component Value Date   ALT 32 11/08/2023   AST 25 11/08/2023   ALKPHOS 134 (H) 11/08/2023   BILITOT 0.3 11/08/2023   The 10-year ASCVD risk score (Arnett DK, et al., 2019) is: 4.4%   Values used to calculate the score:     Age: 78 years     Clincally relevant sex: Female     Is Non-Hispanic African American: Yes     Diabetic: Yes     Tobacco smoker: No      Systolic Blood Pressure: 105 mmHg     Is BP treated: Yes     HDL Cholesterol: 53 mg/dL     Total Cholesterol: 142 mg/dL  Plan: Continue Crestor  10 mg daily Continue Mounjaro  10 mg weekly Continue to work on nutrition plan -decreasing simple carbohydrates, increasing lean proteins, decreasing saturated fats and cholesterol , avoiding trans fats and exercise as able to promote weight loss, improve lipids and decrease cardiovascular risks.      General Health Maintenance She is making some changes to her food choices, such as trying Kevin's meals, and her taste for sweet foods has changed.  Follow-up Follow-up appointments are scheduled to monitor her progress and adjust her treatment plan as necessary. - Follow up on August 5th at 7:30 AM - Schedule next follow-up on September 2nd at 8:00 AM  Vitals Temp: (!) 97.5 F (36.4  C) BP: 105/73 Pulse Rate: 99 SpO2: 98 %   Anthropometric Measurements Height: 5' 7 (1.702 m) Weight: 238 lb (108 kg) BMI (Calculated): 37.27 Weight at Last Visit: 247 lb Weight Lost Since Last Visit: 9 lb Weight Gained Since Last Visit: 0 Starting Weight: 254 lb Total Weight Loss (lbs): 16 lb (7.258 kg) Peak Weight: 254 lb   Body Composition  Body Fat %: 44.9 % Fat Mass (lbs): 107.2 lbs Muscle Mass (lbs): 125 lbs Total Body Water (lbs): 83.8 lbs Visceral Fat Rating : 13   Other Clinical Data Fasting: No Labs: No Today's Visit #: 33 Starting Date: 09/10/21     ASSESSMENT AND PLAN:  Diet: Ashley Mercado is currently in the action stage of change. As such, her goal is to continue with weight loss efforts. She has agreed to Category 2 Plan.  Exercise: Ashley Mercado has been instructed to work up to a goal of 150 minutes of combined cardio and strengthening exercise per week for weight loss and overall health benefits.   Behavior Modification:  We discussed the following Behavioral Modification Strategies today: increasing lean protein intake,  decreasing simple carbohydrates, increasing vegetables, increase H2O intake, increase high fiber foods, no skipping meals, meal planning and cooking strategies, avoiding temptations, and planning for success. We discussed various medication options to help Ashley Mercado with her weight loss efforts and we both agreed to continue current treatment plan.  Return in about 4 weeks (around 02/07/2024).SABRA She was informed of the importance of frequent follow up visits to maximize her success with intensive lifestyle modifications for her multiple health conditions.  Attestation Statements:   Reviewed by clinician on day of visit: allergies, medications, problem list, medical history, surgical history, family history, social history, and previous encounter notes.   Time spent on visit including pre-visit chart review and post-visit care and charting was 25 minutes.    Arul Farabee, PA-C

## 2024-01-10 ENCOUNTER — Ambulatory Visit (INDEPENDENT_AMBULATORY_CARE_PROVIDER_SITE_OTHER): Admitting: Physician Assistant

## 2024-01-10 ENCOUNTER — Encounter (INDEPENDENT_AMBULATORY_CARE_PROVIDER_SITE_OTHER): Payer: Self-pay | Admitting: Physician Assistant

## 2024-01-10 VITALS — BP 105/73 | HR 99 | Temp 97.5°F | Ht 67.0 in | Wt 238.0 lb

## 2024-01-10 DIAGNOSIS — G43909 Migraine, unspecified, not intractable, without status migrainosus: Secondary | ICD-10-CM

## 2024-01-10 DIAGNOSIS — E559 Vitamin D deficiency, unspecified: Secondary | ICD-10-CM

## 2024-01-10 DIAGNOSIS — E1165 Type 2 diabetes mellitus with hyperglycemia: Secondary | ICD-10-CM

## 2024-01-10 DIAGNOSIS — Z7985 Long-term (current) use of injectable non-insulin antidiabetic drugs: Secondary | ICD-10-CM

## 2024-01-10 DIAGNOSIS — E1169 Type 2 diabetes mellitus with other specified complication: Secondary | ICD-10-CM | POA: Diagnosis not present

## 2024-01-10 DIAGNOSIS — E785 Hyperlipidemia, unspecified: Secondary | ICD-10-CM | POA: Diagnosis not present

## 2024-01-10 DIAGNOSIS — E538 Deficiency of other specified B group vitamins: Secondary | ICD-10-CM

## 2024-01-10 DIAGNOSIS — E669 Obesity, unspecified: Secondary | ICD-10-CM

## 2024-01-10 DIAGNOSIS — Z6837 Body mass index (BMI) 37.0-37.9, adult: Secondary | ICD-10-CM

## 2024-01-10 MED ORDER — TIRZEPATIDE 10 MG/0.5ML ~~LOC~~ SOAJ
10.0000 mg | SUBCUTANEOUS | 2 refills | Status: DC
Start: 2024-01-10 — End: 2024-02-07

## 2024-01-10 MED ORDER — TOPIRAMATE 25 MG PO TABS: 50.0000 mg | ORAL_TABLET | Freq: Every evening | ORAL | 1 refills | Status: DC

## 2024-02-06 NOTE — Progress Notes (Unsigned)
 SUBJECTIVE: Discussed the use of AI scribe software for clinical note transcription with the patient, who gave verbal consent to proceed.  Chief Complaint: Obesity  Interim History: She is down 2 lbs since her last visit.  Down 18 lbs overall TBW loss of 7.1%  Ashley Mercado is here to discuss her progress with her obesity treatment plan. She is on the Category 2 Plan and states she is following her eating plan approximately 90 % of the time. She states she is exercising walking 30 minutes 3 times per week.  Ashley Mercado is a 53 year old female with obesity, type 2 diabetes, and hyperlipidemia who presents for follow-up of her obesity treatment plan.  She injured her middle toe by hitting it against outdoor furniture, initially suspecting a fracture due to swelling the next morning. Medical evaluation confirmed a fracture, and she is currently wearing shoes that do not put pressure on the toe and is using buddy taping. The injury occurred two and a half weeks ago and has been limiting her mobility.  She has a history of emotional eating and cravings, which she is managing with her current treatment plan. No recent cravings and she is working on increasing her water intake. She experienced stomach upset after consuming regular milk products and has realized she needs to avoid them, opting for lactose-free products instead.  She is currently on 10 mg of Benjaro and topiramate , which she feels is helping her manage her weight and cravings. She is unable to engage in her usual physical activities due to her toe injury but is considering upper body exercises to maintain muscle mass.  She has a history of vitamin D  deficiency and low vitamin B12, which are being monitored as part of her overall health management.  Upcoming trip to [place] for [place].  OBJECTIVE: Visit Diagnoses: Problem List Items Addressed This Visit     DM (diabetes mellitus), type 2 (HCC) - Primary   Relevant Medications    tirzepatide  (MOUNJARO ) 10 MG/0.5ML Pen   Generalized obesity- Start BMI 39.9   Relevant Medications   tirzepatide  (MOUNJARO ) 10 MG/0.5ML Pen   BMI 37.0-37.9, adult   Other Visit Diagnoses       Toe pain, right middle toe fracture         Acquired lactose intolerance         Migraine without status migrainosus, not intractable, unspecified migraine type       Relevant Medications   topiramate  (TOPAMAX ) 25 MG tablet      Obesity with emotional eating and food cravings Obesity management is ongoing with a focus on emotional eating and food cravings. Weight loss progress noted with a reduction of two pounds, though muscle mass has decreased while adipose tissue remains unchanged. Hunger is well-controlled, and cravings are not currently an issue. - Continue 10 mg of Mounjaro . - Continue topiramate  as it helps with cravings and overeating. - Encourage upper body strengthening exercises with weights and resistance bands. - Advise core strengthening exercises such as 'dead bugs'. - Encourage hydration with a goal of 90 ounces of fluids per day, primarily water.  Meds ordered this encounter  Medications   tirzepatide  (MOUNJARO ) 10 MG/0.5ML Pen    Sig: Inject 10 mg into the skin once a week.    Dispense:  2 mL    Refill:  2   topiramate  (TOPAMAX ) 25 MG tablet    Sig: Take 2 tablets (50 mg total) by mouth every evening.    Dispense:  60 tablet    Refill:  1    Type 2 Diabetes Mellitus with hyperglycemia, without long-term current use of insulin  HgbA1c is at goal. Last A1c was 5.8-   Medication(s): Mounjaro  10 mg SQ weekly Denies mass in neck, dysphagia, dyspepsia, persistent hoarseness, abdominal pain, or N/V/Constipation or diarrhea. Has annual eye exam. Mood is stable.   Lab Results  Component Value Date   HGBA1C 5.8 (H) 11/08/2023   HGBA1C 5.9 03/01/2023   HGBA1C 5.6 07/26/2022   Lab Results  Component Value Date   LDLCALC 74 11/08/2023   CREATININE 0.78 11/08/2023   Lab  Results  Component Value Date   GFR 68.88 03/01/2023   GFR 71.18 02/26/2022   GFR 67.30 02/02/2021    Plan: Continue and refill Mounjaro  10 mg SQ weekly She is working  on nutrition plan to decrease simple carbohydrates, increase lean proteins and exercise to promote weight loss and improve glycemic control .   Nondisplaced fracture of middle phalanx of right middle toe Nondisplaced fracture of the middle phalanx of the right middle toe occurred approximately two and a half weeks ago. Swelling noted initially, and buddy taping was recommended. She is currently wearing shoes that do not aggravate the injury. Healing is expected to take six to eight weeks. - Wear firm-soled shoes such as New Balance or Hoka for stability. - Encourage upper body and core exercises to maintain fitness while the toe heals.  Lactose intolerance Lactose intolerance noted with gastrointestinal upset following consumption of regular milk products. She typically uses lactose-free products but had an episode of upset stomach after consuming cheesecake containing regular milk. - Use lactose-free products to avoid gastrointestinal upset. - Consider over-the-counter lactase supplements if consuming lactose-containing foods.  History of Migraine HA On topiramate  with good result. No SE. Also helps with cravings/EEB.  Plan: Refill/continue topiramate  50 mg daily in evenings for EEB as well as migraine prophylaxis Continue working on nutrition plan and exercise to promote healthy weight loss.     Vitals Temp: 97.7 F (36.5 C) BP: 112/77 Pulse Rate: 82 SpO2: 100 %   Anthropometric Measurements Height: 5' 7 (1.702 m) Weight: 236 lb (107 kg) BMI (Calculated): 36.95 Weight at Last Visit: 238 lb Weight Lost Since Last Visit: 2 lb Weight Gained Since Last Visit: 0 Starting Weight: 254 lb Total Weight Loss (lbs): 18 lb (8.165 kg) Peak Weight: 254 lb   Body Composition  Body Fat %: 45.5 % Fat Mass (lbs):  107.4 lbs Muscle Mass (lbs): 122.4 lbs Total Body Water (lbs): 83.8 lbs Visceral Fat Rating : 13   Other Clinical Data Fasting: yes Labs: no Today's Visit #: 34 Starting Date: 09/10/21     ASSESSMENT AND PLAN:  Diet: Ashley Mercado is currently in the action stage of change. As such, her goal is to continue with weight loss efforts. She has agreed to Category 2 Plan.  Exercise: Ashley Mercado has been instructed to work up to a goal of 150 minutes of combined cardio and strengthening exercise per week and try some upper body strengthening and add lower body if tolerated for weight loss and overall health benefits.   Behavior Modification:  We discussed the following Behavioral Modification Strategies today: increasing lean protein intake, decreasing simple carbohydrates, increasing vegetables, increase H2O intake, increase high fiber foods, no skipping meals, meal planning and cooking strategies, emotional eating strategies , avoiding temptations, and planning for success. We discussed various medication options to help Ashley Mercado with her weight loss efforts and we both  agreed to continue current treatment plan.  Return in about 4 weeks (around 03/06/2024).Ashley Mercado She was informed of the importance of frequent follow up visits to maximize her success with intensive lifestyle modifications for her multiple health conditions.  Attestation Statements:   Reviewed by clinician on day of visit: allergies, medications, problem list, medical history, surgical history, family history, social history, and previous encounter notes.   Time spent on visit including pre-visit chart review and post-visit care and charting was 28 minutes.    Analisse Randle, PA-C

## 2024-02-07 ENCOUNTER — Encounter (INDEPENDENT_AMBULATORY_CARE_PROVIDER_SITE_OTHER): Payer: Self-pay | Admitting: Physician Assistant

## 2024-02-07 ENCOUNTER — Ambulatory Visit (INDEPENDENT_AMBULATORY_CARE_PROVIDER_SITE_OTHER): Admitting: Physician Assistant

## 2024-02-07 VITALS — BP 112/77 | HR 82 | Temp 97.7°F | Ht 67.0 in | Wt 236.0 lb

## 2024-02-07 DIAGNOSIS — Z7985 Long-term (current) use of injectable non-insulin antidiabetic drugs: Secondary | ICD-10-CM

## 2024-02-07 DIAGNOSIS — E1165 Type 2 diabetes mellitus with hyperglycemia: Secondary | ICD-10-CM | POA: Diagnosis not present

## 2024-02-07 DIAGNOSIS — E738 Other lactose intolerance: Secondary | ICD-10-CM | POA: Diagnosis not present

## 2024-02-07 DIAGNOSIS — Z6837 Body mass index (BMI) 37.0-37.9, adult: Secondary | ICD-10-CM

## 2024-02-07 DIAGNOSIS — G43909 Migraine, unspecified, not intractable, without status migrainosus: Secondary | ICD-10-CM | POA: Diagnosis not present

## 2024-02-07 DIAGNOSIS — E1169 Type 2 diabetes mellitus with other specified complication: Secondary | ICD-10-CM

## 2024-02-07 DIAGNOSIS — M79674 Pain in right toe(s): Secondary | ICD-10-CM | POA: Diagnosis not present

## 2024-02-07 DIAGNOSIS — E559 Vitamin D deficiency, unspecified: Secondary | ICD-10-CM

## 2024-02-07 DIAGNOSIS — E669 Obesity, unspecified: Secondary | ICD-10-CM

## 2024-02-07 MED ORDER — TIRZEPATIDE 10 MG/0.5ML ~~LOC~~ SOAJ: 10.0000 mg | SUBCUTANEOUS | 2 refills | Status: DC

## 2024-02-07 MED ORDER — TOPIRAMATE 25 MG PO TABS
50.0000 mg | ORAL_TABLET | Freq: Every evening | ORAL | 1 refills | Status: DC
Start: 2024-02-07 — End: 2024-04-03

## 2024-02-08 ENCOUNTER — Ambulatory Visit (INDEPENDENT_AMBULATORY_CARE_PROVIDER_SITE_OTHER)

## 2024-02-08 ENCOUNTER — Ambulatory Visit: Admitting: Podiatry

## 2024-02-08 ENCOUNTER — Encounter: Payer: Self-pay | Admitting: Podiatry

## 2024-02-08 VITALS — Ht 67.0 in | Wt 236.0 lb

## 2024-02-08 DIAGNOSIS — S92526A Nondisplaced fracture of medial phalanx of unspecified lesser toe(s), initial encounter for closed fracture: Secondary | ICD-10-CM

## 2024-02-08 DIAGNOSIS — M79671 Pain in right foot: Secondary | ICD-10-CM | POA: Diagnosis not present

## 2024-02-08 DIAGNOSIS — S92501A Displaced unspecified fracture of right lesser toe(s), initial encounter for closed fracture: Secondary | ICD-10-CM | POA: Diagnosis not present

## 2024-02-10 NOTE — Progress Notes (Signed)
  Subjective:  Patient ID: Ashley Mercado, female    DOB: February 18, 1971,  MRN: [id number]  Chief Complaint  Patient presents with   Toe Injury    Rm 4 patient is here for fracture of right 3rd toe. Patient states injury occurred on July14th after hitting right foot against some outdoor furniture. Right 3rd toe continues to swell and it's difficult wearing close toed shoes.    Discussed the use of AI scribe software for clinical note transcription with the patient, who gave verbal consent to proceed.  History of Present Illness Ashley Mercado is a 53 year old female who presents with a fractured right distal third toe after stubbing it on outdoor furniture.  She injured her right distal third toe by hitting it on outdoor furniture, humorously describing the incident as 'picking a fight with a chair.' She has not experienced a toe fracture before.  She notes significant pain and swelling in the affected toe, which is exacerbated by wearing steel-toed shoes required for her work. She describes the sensation as her foot feeling 'on fire' and swollen when wearing these shoes. Her employer has allowed her to alternate between steel-toed shoes and regular shoes to manage the discomfort.  She has been using toe wraps, but they have not provided relief. She is currently not taking any specific medication for the toe injury.  She is on her feet a lot for work, which has been challenging due to the injury. She tries to rest her foot whenever possible by removing her shoes during breaks. She is concerned about the impact of the injury on her ability to perform her job duties, especially when required to wear steel-toed shoes.      Objective:    Physical Exam VASCULAR: DP and PT pulse palpable. Foot is warm and well-perfused. Capillary fill time is brisk. DERMATOLOGIC: Normal skin turgor, texture, and temperature. No open lesions, rashes, or ulcerations. NEUROLOGIC: Normal sensation to light touch and  pressure. No paresthesias. ORTHOPEDIC: Pain, swelling, and edema on right distal third toe, proximal to nail of DIPJ.        Results RADIOLOGY Right foot X-ray: Fracture of synostosis of the right distal phalanx third toe   Assessment:   1. Closed fracture of phalanx of right third toe, initial encounter      Plan:  Patient was evaluated and treated and all questions answered.  Assessment and Plan Assessment & Plan Nondisplaced fracture of phalanx of third toe Nondisplaced fracture of the synostosis of the right distal phalanx of the third toe, proximal to the nail of the DIPJ. 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 Pain, swelling, and edema are present. The fracture is expected to be painful for about a month, with full recovery anticipated in two months. There is a low risk of nonunion, which could necessitate surgery, but this is uncommon. - Provide a surgical shoe for use outside of work. 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 - Advise to return if pain persists three months post-injury for further evaluation and possible x-ray.      No follow-ups on file.

## 2024-03-06 ENCOUNTER — Telehealth: Payer: Self-pay | Admitting: Family Medicine

## 2024-03-06 ENCOUNTER — Ambulatory Visit (INDEPENDENT_AMBULATORY_CARE_PROVIDER_SITE_OTHER): Admitting: Physician Assistant

## 2024-03-06 ENCOUNTER — Encounter (INDEPENDENT_AMBULATORY_CARE_PROVIDER_SITE_OTHER): Payer: Self-pay | Admitting: Physician Assistant

## 2024-03-06 VITALS — BP 123/85 | HR 60 | Temp 98.2°F | Ht 67.0 in | Wt 233.0 lb

## 2024-03-06 DIAGNOSIS — I10 Essential (primary) hypertension: Secondary | ICD-10-CM

## 2024-03-06 DIAGNOSIS — G43909 Migraine, unspecified, not intractable, without status migrainosus: Secondary | ICD-10-CM | POA: Diagnosis not present

## 2024-03-06 DIAGNOSIS — E1165 Type 2 diabetes mellitus with hyperglycemia: Secondary | ICD-10-CM | POA: Diagnosis not present

## 2024-03-06 DIAGNOSIS — Z7985 Long-term (current) use of injectable non-insulin antidiabetic drugs: Secondary | ICD-10-CM

## 2024-03-06 DIAGNOSIS — E119 Type 2 diabetes mellitus without complications: Secondary | ICD-10-CM

## 2024-03-06 DIAGNOSIS — E1169 Type 2 diabetes mellitus with other specified complication: Secondary | ICD-10-CM

## 2024-03-06 DIAGNOSIS — E785 Hyperlipidemia, unspecified: Secondary | ICD-10-CM

## 2024-03-06 DIAGNOSIS — E559 Vitamin D deficiency, unspecified: Secondary | ICD-10-CM

## 2024-03-06 DIAGNOSIS — E669 Obesity, unspecified: Secondary | ICD-10-CM

## 2024-03-06 DIAGNOSIS — K5900 Constipation, unspecified: Secondary | ICD-10-CM

## 2024-03-06 DIAGNOSIS — Z6836 Body mass index (BMI) 36.0-36.9, adult: Secondary | ICD-10-CM

## 2024-03-06 DIAGNOSIS — E538 Deficiency of other specified B group vitamins: Secondary | ICD-10-CM

## 2024-03-06 DIAGNOSIS — Z0000 Encounter for general adult medical examination without abnormal findings: Secondary | ICD-10-CM

## 2024-03-06 MED ORDER — TIRZEPATIDE 10 MG/0.5ML ~~LOC~~ SOAJ: 10.0000 mg | SUBCUTANEOUS | 2 refills | Status: DC

## 2024-03-06 NOTE — Progress Notes (Signed)
 SUBJECTIVE: Discussed the use of AI scribe software for clinical note transcription with the patient, who gave verbal consent to proceed.  Chief Complaint: Obesity  Interim History: She is down 3 lbs since her last visit Down 21 lbs overall TBW loss of 8.3 %  Ashley Mercado is here to discuss her progress with her obesity treatment plan. She is on the Category 2 Plan and states she is following her eating plan approximately 70 % of the time. She states she is exercising walking 40-45 minutes 5 times per week.  Ashley Mercado is a 53 year old female with obesity who presents for follow-up of her obesity treatment plan.  She is adhering to a category two obesity treatment plan approximately seventy percent of the time, resulting in a weight loss of three pounds since her last visit. Her diet includes more whole foods and the prescribed amount of protein, though she is not consistently hydrating adequately. She occasionally skips meals but compensates for her protein intake later in the day. She sleeps seven to nine hours per night.  She is currently on Mounjaro  10 mg once weekly for type 2 diabetes, with occasional constipation as a side effect, managed with Miralax once a week. Her diet may lack fiber due to increased meat consumption from grilling. We discussed trying to incorporate more whole foods- fruits and vegetables.   She takes topiramate , adjusting the dose between one or two doses as needed for migraine management. She reports that her migraines are not as severe as before, describing them as tension rather than headaches that 'knock her out' or put her to sleep. She experiences taste changes with carbonated water, describing it as 'flat'. Topiramate  also helps reduce her sweet cravings.  Her physical activity has been less than planned due to unexpected travel and social events, disrupting her weightlifting routine. She reports that her fractured toe feels better and that she is not having  swelling like before.  She works at [employer] in a cold environment currently, which affects her water intake, as she drinks more when working in warmer areas. Upcoming events include birthdays and social gatherings, and she plans to manage her diet by focusing on gift-giving instead of attending parties.  Seeing PCP soon for AWV and to have labs checked then  OBJECTIVE: Visit Diagnoses: Problem List Items Addressed This Visit     DM (diabetes mellitus), type 2 (HCC) - Primary   Relevant Medications   tirzepatide  (MOUNJARO ) 10 MG/0.5ML Pen   Hyperlipidemia associated with type 2 diabetes mellitus (HCC)   Relevant Medications   tirzepatide  (MOUNJARO ) 10 MG/0.5ML Pen   Generalized obesity- Start BMI 39.9   Relevant Medications   tirzepatide  (MOUNJARO ) 10 MG/0.5ML Pen   Other Visit Diagnoses       Migraine without status migrainosus, not intractable, unspecified migraine type         Constipation, unspecified constipation type         BMI 36.0-36.9,adult Current BMI 36.6         Obesity Obesity management is ongoing with a current weight loss of three pounds since the last visit. She is following a category two plan approximately seventy percent of the time, focusing on whole foods and prescribed protein intake. However, she is not consistently drinking enough water and occasionally skips meals. Physical activity was less than planned due to travel and social events, but she has built 0.4 pounds of muscle and lost almost three pounds of adipose tissue. She is currently on Mounjaro   10 mg once weekly, primarily for type 2 diabetes, and is also taking topiramate , which helps with cravings. - Continue Mounjaro  10 mg once weekly - Encourage consistent adherence to the dietary plan - Increase water intake - Incorporate weight training for strength and metabolism - Continue topiramate  as it helps with cravings  Type 2 diabetes mellitus Type 2 diabetes is being managed with Mounjaro  10 mg once  weekly. There are no reported issues with the medication, and it is contributing to weight management. Blood glucose control appears stable as there are no reported symptoms of hyperglycemia or hypoglycemia. Denies mass in neck, dysphagia, dyspepsia, persistent hoarseness, abdominal pain, or N/V/Constipation or diarrhea. Has annual eye exam. Mood is stable.  Lab Results  Component Value Date   HGBA1C 5.8 (H) 11/08/2023   HGBA1C 5.9 03/01/2023   HGBA1C 5.6 07/26/2022   Lab Results  Component Value Date   LDLCALC 74 11/08/2023   CREATININE 0.78 11/08/2023   She is working  on nutrition plan to decrease simple carbohydrates, increase lean proteins and exercise to promote weight loss and improve glycemic control . - Continue Mounjaro  10 mg once weekly - Monitor blood glucose levels regularly  Migraines Migraine frequency has increased with weather changes, but the severity has decreased, allowing her to remain functional. She is taking topiramate , which has been effective in reducing the severity of migraines and also helps with cravings. She reports some tingling in the fingertips and altered taste with certain beverages, but these are manageable. - Continue topiramate  25- 50 mg nightly as prescribed- no refilled needed this visit.  ALso helping to decrease cravings/emotional eating  - Monitor migraine frequency and severity  Constipation Constipation is possibly related to dietary changes with increased meat intake and reduced fiber. She uses Miralax occasionally, about once a week, which helps manage symptoms. - Increase dietary fiber intake with fruits like apples and blueberries - increase water intake - Fluid goal of 80-100 oz daily - Use Miralax as needed  Vitals Temp: 98.2 F (36.8 C) BP: 123/85 Pulse Rate: 60 SpO2: 100 %   Anthropometric Measurements Height: 5' 7 (1.702 m) Weight: 233 lb (105.7 kg) BMI (Calculated): 36.48 Weight at Last Visit: 236 lb Weight Lost Since  Last Visit: 3 lb Weight Gained Since Last Visit: 0 Starting Weight: 254 lb Total Weight Loss (lbs): 21 lb (9.526 kg) Peak Weight: 254 lb   Body Composition  Body Fat %: 44.6 % Fat Mass (lbs): 104.2 lbs Muscle Mass (lbs): 122.8 lbs Total Body Water (lbs): 83 lbs Visceral Fat Rating : 13   Other Clinical Data Fasting: No Labs: No Today's Visit #: 35 Starting Date: 09/10/21     ASSESSMENT AND PLAN:  Diet: Merna is currently in the action stage of change. As such, her goal is to continue with weight loss efforts. She has agreed to Category 2 Plan.  Exercise: Bronte has been instructed to work up to a goal of 150 minutes of combined cardio and strengthening exercise per week, to continue exercising as is, and add some weight training for weight loss and overall health benefits.   Behavior Modification:  We discussed the following Behavioral Modification Strategies today: increasing lean protein intake, decreasing simple carbohydrates, increasing vegetables, increase H2O intake, increase high fiber foods, no skipping meals, meal planning and cooking strategies, avoiding temptations, and planning for success. We discussed various medication options to help Keyaira with her weight loss efforts and we both agreed to continue current treatment plan.  Return in  about 4 weeks (around 04/03/2024).SABRA She was informed of the importance of frequent follow up visits to maximize her success with intensive lifestyle modifications for her multiple health conditions.  Attestation Statements:   Reviewed by clinician on day of visit: allergies, medications, problem list, medical history, surgical history, family history, social history, and previous encounter notes.   Time spent on visit including pre-visit chart review and post-visit care and charting was 25 minutes.    Jens Siems, PA-C

## 2024-03-06 NOTE — Telephone Encounter (Signed)
-----   Message from Veva JINNY Ferrari sent at 02/21/2024  3:45 PM EDT ----- Regarding: Lab orders for Wed, 9.3.25 Patient is scheduled for CPX labs, please order future labs, Thanks , Veva

## 2024-03-07 ENCOUNTER — Other Ambulatory Visit: Payer: BC Managed Care – PPO

## 2024-03-07 ENCOUNTER — Ambulatory Visit: Payer: Self-pay | Admitting: Family Medicine

## 2024-03-07 DIAGNOSIS — E559 Vitamin D deficiency, unspecified: Secondary | ICD-10-CM | POA: Diagnosis not present

## 2024-03-07 DIAGNOSIS — E538 Deficiency of other specified B group vitamins: Secondary | ICD-10-CM | POA: Diagnosis not present

## 2024-03-07 DIAGNOSIS — E1169 Type 2 diabetes mellitus with other specified complication: Secondary | ICD-10-CM | POA: Diagnosis not present

## 2024-03-07 DIAGNOSIS — E785 Hyperlipidemia, unspecified: Secondary | ICD-10-CM

## 2024-03-07 DIAGNOSIS — R7989 Other specified abnormal findings of blood chemistry: Secondary | ICD-10-CM

## 2024-03-07 DIAGNOSIS — E119 Type 2 diabetes mellitus without complications: Secondary | ICD-10-CM

## 2024-03-07 DIAGNOSIS — I10 Essential (primary) hypertension: Secondary | ICD-10-CM

## 2024-03-07 LAB — COMPREHENSIVE METABOLIC PANEL WITH GFR
ALT: 23 U/L (ref 0–35)
AST: 22 U/L (ref 0–37)
Albumin: 4.6 g/dL (ref 3.5–5.2)
Alkaline Phosphatase: 100 U/L (ref 39–117)
BUN: 18 mg/dL (ref 6–23)
CO2: 28 meq/L (ref 19–32)
Calcium: 9.9 mg/dL (ref 8.4–10.5)
Chloride: 101 meq/L (ref 96–112)
Creatinine, Ser: 1.06 mg/dL (ref 0.40–1.20)
GFR: 59.2 mL/min — ABNORMAL LOW (ref >60.00)
Glucose, Bld: 83 mg/dL (ref 70–99)
Potassium: 4.9 meq/L (ref 3.5–5.1)
Sodium: 139 meq/L (ref 135–145)
Total Bilirubin: 0.7 mg/dL (ref 0.2–1.2)
Total Protein: 8.2 g/dL (ref 6.0–8.3)

## 2024-03-07 LAB — LIPID PANEL
Cholesterol: 145 mg/dL (ref 0–200)
HDL: 50.5 mg/dL (ref >39.00)
LDL Cholesterol: 76 mg/dL (ref 0–99)
NonHDL: 94.22
Total CHOL/HDL Ratio: 3
Triglycerides: 93 mg/dL (ref 0.0–149.0)
VLDL: 18.6 mg/dL (ref 0.0–40.0)

## 2024-03-07 LAB — MICROALBUMIN / CREATININE URINE RATIO
Creatinine,U: 148.7 mg/dL
Microalb Creat Ratio: 6.6 mg/g (ref 0.0–30.0)
Microalb, Ur: 1 mg/dL (ref 0.0–1.9)

## 2024-03-07 LAB — VITAMIN B12: Vitamin B-12: 553 pg/mL (ref 211–911)

## 2024-03-07 LAB — TSH: TSH: 1.01 u[IU]/mL (ref 0.35–5.50)

## 2024-03-07 LAB — HEMOGLOBIN A1C: Hgb A1c MFr Bld: 6 % (ref 4.6–6.5)

## 2024-03-07 LAB — VITAMIN D 25 HYDROXY (VIT D DEFICIENCY, FRACTURES): VITD: 66.1 ng/mL (ref 30.00–100.00)

## 2024-03-07 LAB — FERRITIN: Ferritin: 159.8 ng/mL (ref 10.0–291.0)

## 2024-03-07 LAB — IRON: Iron: 88 ug/dL (ref 42–145)

## 2024-03-07 NOTE — Addendum Note (Signed)
 Addended by: HOPE VEVA PARAS on: 03/07/2024 07:27 AM   Modules accepted: Orders

## 2024-03-08 ENCOUNTER — Telehealth: Payer: Self-pay | Admitting: Family Medicine

## 2024-03-08 LAB — CBC WITH DIFFERENTIAL/PLATELET
Absolute Lymphocytes: 1425 {cells}/uL (ref 850–3900)
Absolute Monocytes: 502 {cells}/uL (ref 200–950)
Basophils Absolute: 29 {cells}/uL (ref 0–200)
Basophils Relative: 0.5 %
Eosinophils Absolute: 23 {cells}/uL (ref 15–500)
Eosinophils Relative: 0.4 %
HCT: 50.8 % — ABNORMAL HIGH (ref 35.0–45.0)
Hemoglobin: 15.9 g/dL — ABNORMAL HIGH (ref 11.7–15.5)
MCH: 29.3 pg (ref 27.0–33.0)
MCHC: 31.3 g/dL — ABNORMAL LOW (ref 32.0–36.0)
MCV: 93.6 fL (ref 80.0–100.0)
MPV: 9.9 fL (ref 7.5–12.5)
Monocytes Relative: 8.8 %
Neutro Abs: 3722 {cells}/uL (ref 1500–7800)
Neutrophils Relative %: 65.3 %
Platelets: 432 Thousand/uL — ABNORMAL HIGH (ref 140–400)
RBC: 5.43 Million/uL — ABNORMAL HIGH (ref 3.80–5.10)
RDW: 13.7 % (ref 11.0–15.0)
Total Lymphocyte: 25 %
WBC: 5.7 Thousand/uL (ref 3.8–10.8)

## 2024-03-08 LAB — TIQ- AMBIGUOUS ORDER: UNCLEAR ORDER:: 833

## 2024-03-08 NOTE — Telephone Encounter (Signed)
 [facility] has discontinued Pathology review, I did call [facility] and requested a peripheral smear which the correct sample and slides were sent. Also, put a new criteria for reading peripheral smears on your desk.

## 2024-03-08 NOTE — Telephone Encounter (Signed)
 Aware -path won't be done  Appreciate the heads up

## 2024-03-14 ENCOUNTER — Encounter: Payer: Self-pay | Admitting: Family Medicine

## 2024-03-14 ENCOUNTER — Other Ambulatory Visit (HOSPITAL_COMMUNITY)
Admission: RE | Admit: 2024-03-14 | Discharge: 2024-03-14 | Disposition: A | Discharge status: Home / Self Care | Source: Ambulatory Visit | Attending: Family Medicine | Admitting: Family Medicine

## 2024-03-14 ENCOUNTER — Ambulatory Visit (INDEPENDENT_AMBULATORY_CARE_PROVIDER_SITE_OTHER): Payer: BC Managed Care – PPO | Admitting: Family Medicine

## 2024-03-14 VITALS — BP 118/70 | HR 88 | Temp 98.0°F | Ht 67.0 in | Wt 238.4 lb

## 2024-03-14 DIAGNOSIS — Z0000 Encounter for general adult medical examination without abnormal findings: Secondary | ICD-10-CM

## 2024-03-14 DIAGNOSIS — Z01419 Encounter for gynecological examination (general) (routine) without abnormal findings: Secondary | ICD-10-CM

## 2024-03-14 DIAGNOSIS — Z1211 Encounter for screening for malignant neoplasm of colon: Secondary | ICD-10-CM

## 2024-03-14 DIAGNOSIS — Z7985 Long-term (current) use of injectable non-insulin antidiabetic drugs: Secondary | ICD-10-CM | POA: Diagnosis not present

## 2024-03-14 DIAGNOSIS — E785 Hyperlipidemia, unspecified: Secondary | ICD-10-CM

## 2024-03-14 DIAGNOSIS — E538 Deficiency of other specified B group vitamins: Secondary | ICD-10-CM

## 2024-03-14 DIAGNOSIS — E119 Type 2 diabetes mellitus without complications: Secondary | ICD-10-CM

## 2024-03-14 DIAGNOSIS — E1169 Type 2 diabetes mellitus with other specified complication: Secondary | ICD-10-CM

## 2024-03-14 DIAGNOSIS — E782 Mixed hyperlipidemia: Secondary | ICD-10-CM

## 2024-03-14 DIAGNOSIS — R7989 Other specified abnormal findings of blood chemistry: Secondary | ICD-10-CM

## 2024-03-14 DIAGNOSIS — E559 Vitamin D deficiency, unspecified: Secondary | ICD-10-CM

## 2024-03-14 DIAGNOSIS — Z8781 Personal history of (healed) traumatic fracture: Secondary | ICD-10-CM

## 2024-03-14 DIAGNOSIS — I10 Essential (primary) hypertension: Secondary | ICD-10-CM

## 2024-03-14 DIAGNOSIS — Z860100 Personal history of colon polyps, unspecified: Secondary | ICD-10-CM

## 2024-03-14 NOTE — Assessment & Plan Note (Signed)
 Discussed how this problem influences overall health and the risks it imposes  Reviewed plan for weight loss with lower calorie diet (via better food choices (lower glycemic and portion control) along with exercise building up to or more than 30 minutes 5 days per week including some aerobic activity and strength training   Continues to work with the [facility] Add strength training Continue mounjaro  for DM

## 2024-03-14 NOTE — Assessment & Plan Note (Signed)
 Disc goals for lipids and reasons to control them Rev last labs with pt Rev low sat fat diet in detail LDL of 76- goal under 70 Wants to work on diet Continue crestor  10 mg daily

## 2024-03-14 NOTE — Assessment & Plan Note (Signed)
 Lab Results  Component Value Date   HGBA1C 6.0 03/07/2024   HGBA1C 5.8 (H) 11/08/2023   HGBA1C 5.9 03/01/2023   Continues mounjaro  10 mg weekly  [facility]  Micralb utd On statin  Sent for recent eye exam report  Encouraged more strength building exercise

## 2024-03-14 NOTE — Assessment & Plan Note (Signed)
 CBC    Component Value Date/Time   WBC 5.7 03/07/2024 0729   RBC 5.43 (H) 03/07/2024 0729   HGB 15.9 (H) 03/07/2024 0729   HGB 15.8 09/10/2021 1142   HCT 50.8 (H) 03/07/2024 0729   HCT 49.9 (H) 09/10/2021 1142   PLT 432 (H) 03/07/2024 0729   PLT 430 09/10/2021 1142   MCV 93.6 03/07/2024 0729   MCV 87 09/10/2021 1142   MCH 29.3 03/07/2024 0729   MCHC 31.3 (L) 03/07/2024 0729   RDW 13.7 03/07/2024 0729   RDW 13.4 09/10/2021 1142   LYMPHSABS 1.5 03/01/2023 0744   LYMPHSABS 1.7 09/10/2021 1142   MONOABS 0.5 03/01/2023 0744   EOSABS 23 03/07/2024 0729   EOSABS 0.1 09/10/2021 1142   BASOSABS 29 03/07/2024 0729   BASOSABS 0.0 09/10/2021 1142   Recent broken toe- may be some inflammatory effect Will continue to watch No smoking  No clotting or bleeding issues

## 2024-03-14 NOTE — Assessment & Plan Note (Signed)
 Reviewed health habits including diet and exercise and skin cancer prevention Reviewed appropriate screening tests for age  Also reviewed health mt list, fam hx and immunization status , as well as social and family history   See HPI Labs reviewed and ordered Health Maintenance  Topic Date Due   Hepatitis C Screening  Never done   Hepatitis B Vaccine (1 of 3 - 19+ 3-dose series) Never done   Pap with HPV screening  04/16/2022   Eye exam for diabetics  12/31/2023   Complete foot exam   03/07/2024   Flu Shot  10/02/2024*   Breast Cancer Screening  07/07/2024   Hemoglobin A1C  09/04/2024   Yearly kidney function blood test for diabetes  03/07/2025   Yearly kidney health urinalysis for diabetes  03/07/2025   Colon Cancer Screening  06/04/2025   DTaP/Tdap/Td vaccine (4 - Td or Tdap) 04/07/2027   Pneumococcal Vaccine for age over 81  Completed   COVID-19 Vaccine  Completed   HIV Screening  Completed   Zoster (Shingles) Vaccine  Completed   HPV Vaccine  Aged Out   Meningitis B Vaccine  Aged Out  *Topic was postponed. The date shown is not the original due date.   Sent for eye exam report  Gyn exam and pap today Discussed fall prevention, supplements and exercise for bone density  PHQ 4 in setting of menopause symptoms

## 2024-03-14 NOTE — Assessment & Plan Note (Signed)
Colonoscopy due 06/2025.

## 2024-03-14 NOTE — Assessment & Plan Note (Signed)
 Exam done -no abnormalities Post menopausal  Mammogram due January   Pap report pending

## 2024-03-14 NOTE — Patient Instructions (Addendum)
 Stay active  Add some strength training to your routine, this is important for bone and brain health and can reduce your risk of falls and help your body use insulin  properly and regulate weight  Light weights, exercise bands , and internet videos are a good way to start  Yoga (chair or regular), machines , floor exercises or a gym with machines are also good options    For cholesterol Avoid red meat/ fried foods/ egg yolks/ fatty breakfast meats/ butter, cheese and high fat dairy/ and shellfish    Make sure your vitamins do NOT have iron in them   Take care

## 2024-03-14 NOTE — Assessment & Plan Note (Signed)
 Mounjaro 10

## 2024-03-14 NOTE — Assessment & Plan Note (Signed)
 Last vitamin D  Lab Results  Component Value Date   VD25OH 66.10 03/07/2024    Encouraged to continue oral supplementation for bone and overall health

## 2024-03-14 NOTE — Assessment & Plan Note (Signed)
 Colonoscopy 06/2022 with 3 year recall

## 2024-03-14 NOTE — Progress Notes (Signed)
 Subjective:    Patient ID: Ashley Mercado, female    DOB: [birth year], 53 y.o.   MRN: [id number]  HPI  Here for health maintenance exam and to review chronic medical problems   Wt Readings from Last 3 Encounters:  03/14/24 238 lb 6.4 oz (108.1 kg)  03/06/24 233 lb (105.7 kg)  02/08/24 236 lb (107 kg)   37.34 kg/m  Vitals:   03/14/24 1458  BP: 118/70  Pulse: 88  Temp: 98 F (36.7 C)  SpO2: 93%    Immunization History  Administered Date(s) Administered   INFLUENZA, HIGH DOSE SEASONAL PF 06/26/2020, 06/30/2021   Influenza Whole 04/04/1998   Influenza, Seasonal, Injecte, Preservative Fre 03/08/2023   Influenza,inj,Quad PF,6+ Mos 03/28/2014, 03/11/2015, 04/06/2017, 04/12/2018, 04/17/2019, 03/03/2021, 03/05/2022, 07/01/2022   Influenza-Unspecified 03/19/2016, 03/28/2020   Moderna Covid Bivalent Peds Booster(51mo Thru 8yrs) 05/07/2021, 12/27/2021   Moderna Sars-Covid-2 Vaccination 07/12/2019, 08/09/2019, 05/01/2020, 06/26/2020, 10/05/2020, 12/27/2021   PNEUMOCOCCAL CONJUGATE-20 08/27/2023   Pfizer(Comirnaty)Fall Seasonal Vaccine 12 years and older 03/13/2023   Td 07/05/1992, 12/05/2006   Tdap 04/06/2017   Zoster Recombinant(Shingrix) 01/27/2020, 04/18/2020    Health Maintenance Due  Topic Date Due   Hepatitis C Screening  Never done   Hepatitis B Vaccines 19-59 Average Risk (1 of 3 - 19+ 3-dose series) Never done   Cervical Cancer Screening (HPV/Pap Cotest)  04/16/2022   OPHTHALMOLOGY EXAM  12/31/2023   FOOT EXAM  03/07/2024    Eye exam - had at [facility] 07/2023 Self breast exam- no lumps   Gyn health Pap 2020 -will do that today  Post menopausal  Tolerating ok  Some night sweats    Colon cancer screening  Colonoscopy 06/2022 with 3 y recall   Bone health   Falls-none  Fractures- middle toe right foot , moving furniture (went to foot doctor)  Supplements vitamin D  Last vitamin D  Lab Results  Component Value Date   VD25OH 66.10 03/07/2024     Exercise  Walking  Needs to start some strength training    Mood    03/08/2023    3:16 PM 09/17/2022    1:57 PM 08/25/2022    8:12 AM 09/10/2021    7:37 AM 03/03/2021    3:45 PM  Depression screen PHQ 2/9  Decreased Interest 0 1 0 3 1  Down, Depressed, Hopeless 0 0 0 1 0  PHQ - 2 Score 0 1 0 4 1  Altered sleeping 2 1 1 3 2   Tired, decreased energy 2 1 1 3 2   Change in appetite 0 1 0 2 1  Feeling bad or failure about yourself  0 0 0 1 0  Trouble concentrating 0 0 1 2 1   Moving slowly or fidgety/restless 0 0 0 2 0  Suicidal thoughts 0 0 0 0 0  PHQ-9 Score 4 4 3 17 7   Difficult doing work/chores -- Not difficult at all Not difficult at all Very difficult Not difficult at all  Mood is good   Taking topamax  for weight  Mounjaro  for DM and weight  [facility]es to [facility]   HTN bp is stable today  No cp or palpitations or headaches or edema  No side effects to medicines  BP Readings from Last 3 Encounters:  03/14/24 118/70  03/06/24 123/85  02/07/24 112/77    Spironolactone  50 mg daily   Lab Results  Component Value Date   NA 139 03/07/2024   K 4.9 03/07/2024   CO2 28 03/07/2024  GLUCOSE 83 03/07/2024   BUN 18 03/07/2024   CREATININE 1.06 03/07/2024   CALCIUM  9.9 03/07/2024   GFR 59.20 (L) 03/07/2024   EGFR 90 11/08/2023   GFRNONAA 103.03 01/16/2010   DM2 Lab Results  Component Value Date   HGBA1C 6.0 03/07/2024   HGBA1C 5.8 (H) 11/08/2023   HGBA1C 5.9 03/01/2023    Mounjaro  10 mg weekly  Lab Results  Component Value Date   MICROALBUR 1.0 03/07/2024  Staying on this dose for now  From [facility]     Hyperlipidemia Lab Results  Component Value Date   CHOL 145 03/07/2024   CHOL 142 11/08/2023   CHOL 139 03/01/2023   Lab Results  Component Value Date   HDL 50.50 03/07/2024   HDL 53 11/08/2023   HDL 53.10 03/01/2023   Lab Results  Component Value Date   LDLCALC 76 03/07/2024   LDLCALC 74 11/08/2023   LDLCALC 69 03/01/2023    Lab Results  Component Value Date   TRIG 93.0 03/07/2024   TRIG 79 11/08/2023   TRIG 88.0 03/01/2023   Lab Results  Component Value Date   CHOLHDL 3 03/07/2024   CHOLHDL 3 03/01/2023   CHOLHDL 2 04/23/2022   No results found for: LDLDIRECT Crestor  10 mg daily   Diet is fair  Did go to some cook outs recently   Lab Results  Component Value Date   ALT 23 03/07/2024   AST 22 03/07/2024   ALKPHOS 100 03/07/2024   BILITOT 0.7 03/07/2024   Lab Results  Component Value Date   TSH 1.01 03/07/2024   Lab Results  Component Value Date   WBC 5.7 03/07/2024   HGB 15.9 (H) 03/07/2024   HCT 50.8 (H) 03/07/2024   MCV 93.6 03/07/2024   PLT 432 (H) 03/07/2024   Plt count down from 452    B12 def Lab Results  Component Value Date   VITAMINB12 553 03/07/2024      Patient Active Problem List   Diagnosis Date Noted   History of toe fracture 03/14/2024   Cervical radiculopathy 09/09/2023   BMI 37.0-37.9, adult 04/12/2023   Generalized obesity- Start BMI 39.9 03/15/2023   Long-term current use of injectable noninsulin antidiabetic medication 03/08/2023   Degenerative disc disease, cervical 09/20/2022   History of colonic polyps 06/04/2022   DM type 2 with diabetic mixed hyperlipidemia (HCC) 03/08/2022   Vitamin D  deficiency 03/05/2022   Low serum vitamin B12 03/05/2022   Hyperlipidemia associated with type 2 diabetes mellitus (HCC) 03/05/2022   Uterine fibroid 07/01/2020   Pelvic joint pain, left 06/24/2020   Colon cancer screening 04/17/2019   DM (diabetes mellitus), type 2 (HCC) 04/06/2017   Carpal tunnel syndrome 11/10/2016   Allergic rhinitis 11/10/2016   Encounter for routine gynecological examination 03/10/2016   Screening for HIV (human immunodeficiency virus) 03/07/2015   Essential hypertension 10/28/2014   Morbid obesity (HCC) 10/20/2011   Routine general medical examination at a health care facility 02/06/2011   Depression 11/04/2010   HIDRADENITIS  SUPPURATIVA 05/12/2010   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   Elevated platelet count 11/30/2006   KELOID SCAR 11/29/2006   Past Medical History:  Diagnosis Date   Allergies    Allergy    Bilateral swelling of feet    DM type 2 with diabetic mixed hyperlipidemia (HCC) 03/08/2022   Folliculitis    GERD (gastroesophageal reflux disease)    High blood pressure    High cholesterol    Hip pain    HSV  infection    Hydradenitis    Hypertension    Keloid scar    Lactose intolerance    Menorrhagia    Multiple food allergies    Prediabetes    Tired    Past Surgical History:  Procedure Laterality Date   COLONOSCOPY WITH PROPOFOL  N/A 06/04/2019   Procedure: COLONOSCOPY WITH PROPOFOL ;  Surgeon: Therisa Bi, MD;  Location: [facility] ENDOSCOPY;  Service: Gastroenterology;  Laterality: N/A;   COLONOSCOPY WITH PROPOFOL  N/A 06/04/2022   Procedure: COLONOSCOPY WITH PROPOFOL ;  Surgeon: Therisa Bi, MD;  Location: [facility] ENDOSCOPY;  Service: Gastroenterology;  Laterality: N/A;   FOOT SURGERY Right    Ligment   Social History   Tobacco Use   Smoking status: Never   Smokeless tobacco: Never  Vaping Use   Vaping status: Never Used  Substance Use Topics   Alcohol use: Yes    Alcohol/week: 1.0 standard drink of alcohol    Types: 1 Glasses of wine per week    Comment: Occasionally   Drug use: No   Family History  Problem Relation Age of Onset   Hypertension Mother    Diabetes Mother    Sleep apnea Mother    Obesity Mother    Arthritis Mother    Hypertension Father    Diabetes Father    Heart disease Father    Kidney disease Father    Cancer Maternal Grandmother        ovarian   Cancer Maternal Uncle        lymphoma   Allergies  Allergen Reactions   Dust Mite Extract Cough and Itching   Fish Allergy Hives    *CATFISH*   Other     artificial sweeteners   Shrimp Extract Itching and Swelling   Sulfa Antibiotics Rash   Current Outpatient Medications on File Prior to Visit  Medication Sig Dispense  Refill   albuterol (VENTOLIN HFA) 108 (90 Base) MCG/ACT inhaler Inhale 2 puffs into the lungs every 6 (six) hours as needed.     Azelastine  HCl 137 MCG/SPRAY SOLN Place into both nostrils.     EPINEPHrine 0.3 mg/0.3 mL IJ SOAJ injection See admin instructions.     fluticasone  (FLONASE ) 50 MCG/ACT nasal spray Place into both nostrils.     levocetirizine (XYZAL) 5 MG tablet Take 2.5 mg by mouth.      meloxicam  (MOBIC ) 15 MG tablet Take 1 tablet (15 mg total) by mouth daily as needed for pain. With a meal 30 tablet 1   montelukast (SINGULAIR) 10 MG tablet Take 10 mg by mouth daily.     Multiple Vitamins-Minerals (MULTIVITAMIN GUMMIES ADULT PO) Take 2 each by mouth daily.     rosuvastatin  (CRESTOR ) 10 MG tablet TAKE ONE TABLET BY MOUTH ONE TIME DAILY 90 tablet 3   spironolactone  (ALDACTONE ) 50 MG tablet TAKE ONE TABLET BY MOUTH ONE TIME DAILY 90 tablet 3   tirzepatide  (MOUNJARO ) 10 MG/0.5ML Pen Inject 10 mg into the skin once a week. 2 mL 2   topiramate  (TOPAMAX ) 25 MG tablet Take 2 tablets (50 mg total) by mouth every evening. 60 tablet 1   No current facility-administered medications on file prior to visit.    Review of Systems  Constitutional:  Positive for fatigue. Negative for activity change, appetite change, fever and unexpected weight change.  HENT:  Negative for congestion, ear pain, rhinorrhea, sinus pressure and sore throat.   Eyes:  Negative for pain, redness and visual disturbance.  Respiratory:  Negative for cough, shortness of  breath and wheezing.   Cardiovascular:  Negative for chest pain and palpitations.  Gastrointestinal:  Negative for abdominal pain, blood in stool, constipation and diarrhea.  Endocrine: Negative for polydipsia and polyuria.  Genitourinary:  Negative for dysuria, frequency and urgency.  Musculoskeletal:  Negative for arthralgias, back pain and myalgias.  Skin:  Negative for pallor and rash.  Allergic/Immunologic: Negative for environmental allergies.   Neurological:  Negative for dizziness, syncope and headaches.  Hematological:  Negative for adenopathy. Does not bruise/bleed easily.  Psychiatric/Behavioral:  Positive for sleep disturbance. Negative for decreased concentration and dysphoric mood. The patient is not nervous/anxious.        Objective:   Physical Exam Constitutional:      General: She is not in acute distress.    Appearance: Normal appearance. She is well-developed. She is obese. She is not ill-appearing or diaphoretic.  HENT:     Head: Normocephalic and atraumatic.     Right Ear: Tympanic membrane, ear canal and external ear normal.     Left Ear: Tympanic membrane, ear canal and external ear normal.     Nose: Nose normal. No congestion.     Mouth/Throat:     Mouth: Mucous membranes are moist.     Pharynx: Oropharynx is clear. No posterior oropharyngeal erythema.  Eyes:     General: No scleral icterus.    Extraocular Movements: Extraocular movements intact.     Conjunctiva/sclera: Conjunctivae normal.     Pupils: Pupils are equal, round, and reactive to light.  Neck:     Thyroid : No thyromegaly.     Vascular: No carotid bruit or JVD.  Cardiovascular:     Rate and Rhythm: Normal rate and regular rhythm.     Pulses: Normal pulses.     Heart sounds: Normal heart sounds.     No gallop.  Pulmonary:     Effort: Pulmonary effort is normal. No respiratory distress.     Breath sounds: Normal breath sounds. No wheezing.     Comments: Good air exch Chest:     Chest wall: No tenderness.  Abdominal:     General: Bowel sounds are normal. There is no distension or abdominal bruit.     Palpations: Abdomen is soft. There is no mass.     Tenderness: There is no abdominal tenderness.     Hernia: No hernia is present.  Genitourinary:    Comments: Breast exam: No mass, nodules, thickening, tenderness, bulging, retraction, inflamation, nipple discharge or skin changes noted.  No axillary or clavicular LA.                    Anus appears normal w/o hemorrhoids or masses       External genitalia : nl appearance and hair distribution/no lesions       Urethral meatus : nl size, no lesions or prolapse       Urethra: no masses, tenderness or scarring      Bladder : no masses or tenderness       Vagina: nl general appearance, no discharge or  Lesions, no significant cystocele  or rectocele       Cervix: no lesions/ discharge or friability      Uterus: nl size, contour, position, and mobility (not fixed) , non tender      Adnexa : no masses, tenderness, enlargement or nodularity          Musculoskeletal:        General: No tenderness. Normal range of motion.  Cervical back: Normal range of motion and neck supple. No rigidity. No muscular tenderness.     Right lower leg: No edema.     Left lower leg: No edema.     Comments: No kyphosis   Lymphadenopathy:     Cervical: No cervical adenopathy.  Skin:    General: Skin is warm and dry.     Coloration: Skin is not pale.     Findings: No erythema or rash.  Neurological:     Mental Status: She is alert. Mental status is at baseline.     Cranial Nerves: No cranial nerve deficit.     Motor: No abnormal muscle tone.     Coordination: Coordination normal.     Gait: Gait normal.     Deep Tendon Reflexes: Reflexes are normal and symmetric. Reflexes normal.  Psychiatric:        Mood and Affect: Mood normal.        Cognition and Memory: Cognition and memory normal.           Assessment & Plan:   Problem List Items Addressed This Visit       Cardiovascular and Mediastinum   Essential hypertension   bp in fair control at this time  BP Readings from Last 1 Encounters:  03/14/24 118/70   No changes needed Most recent labs reviewed  Disc lifstyle change with low sodium diet and exercise  Plan to continue spironolactone  50 mg daily        Endocrine   Hyperlipidemia associated with type 2 diabetes mellitus (HCC)   Disc goals for lipids and reasons to  control them Rev last labs with pt Rev low sat fat diet in detail LDL of 76- goal under 70 Wants to work on diet Continue crestor  10 mg daily       DM type 2 with diabetic mixed hyperlipidemia (HCC)   DM (diabetes mellitus), type 2 (HCC)   Lab Results  Component Value Date   HGBA1C 6.0 03/07/2024   HGBA1C 5.8 (H) 11/08/2023   HGBA1C 5.9 03/01/2023   Continues mounjaro  10 mg weekly  From [facility]  Micralb utd On statin  Sent for recent eye exam report  Encouraged more strength building exercise          Other   Vitamin D  deficiency   Last vitamin D  Lab Results  Component Value Date   VD25OH 66.10 03/07/2024    Encouraged to continue oral supplementation for bone and overall health      Routine general medical examination at a health care facility - Primary   Reviewed health habits including diet and exercise and skin cancer prevention Reviewed appropriate screening tests for age  Also reviewed health mt list, fam hx and immunization status , as well as social and family history   See HPI Labs reviewed and ordered Health Maintenance  Topic Date Due   Hepatitis C Screening  Never done   Hepatitis B Vaccine (1 of 3 - 19+ 3-dose series) Never done   Pap with HPV screening  04/16/2022   Eye exam for diabetics  12/31/2023   Complete foot exam   03/07/2024   Flu Shot  10/02/2024*   Breast Cancer Screening  07/07/2024   Hemoglobin A1C  09/04/2024   Yearly kidney function blood test for diabetes  03/07/2025   Yearly kidney health urinalysis for diabetes  03/07/2025   Colon Cancer Screening  06/04/2025   DTaP/Tdap/Td vaccine (4 - Td or Tdap) 04/07/2027  Pneumococcal Vaccine for age over 27  Completed   COVID-19 Vaccine  Completed   HIV Screening  Completed   Zoster (Shingles) Vaccine  Completed   HPV Vaccine  Aged Out   Meningitis B Vaccine  Aged Out  *Topic was postponed. The date shown is not the original due date.   Sent for eye exam report  Gyn  exam and pap today Discussed fall prevention, supplements and exercise for bone density  PHQ 4 in setting of menopause symptoms        Morbid obesity (HCC)   Discussed how this problem influences overall health and the risks it imposes  Reviewed plan for weight loss with lower calorie diet (via better food choices (lower glycemic and portion control) along with exercise building up to or more than 30 minutes 5 days per week including some aerobic activity and strength training   Continues to work with the healthy weight center Add strength training Continue mounjaro  for DM      Low serum vitamin B12   Lab Results  Component Value Date   VITAMINB12 553 03/07/2024   Continue current supplementation       Long-term current use of injectable noninsulin antidiabetic medication   Mounjaro  10       History of toe fracture   Right middle toe Healing  Still some swelling Not fragility fracture Saw podiatry      History of colonic polyps   Colonoscopy due 06/2025      Encounter for routine gynecological examination   Exam done -no abnormalities Post menopausal  Mammogram due January   Pap report pending       Relevant Orders   Cytology - PAP([place])   Elevated platelet count   CBC    Component Value Date/Time   WBC 5.7 03/07/2024 0729   RBC 5.43 (H) 03/07/2024 0729   HGB 15.9 (H) 03/07/2024 0729   HGB 15.8 09/10/2021 1142   HCT 50.8 (H) 03/07/2024 0729   HCT 49.9 (H) 09/10/2021 1142   PLT 432 (H) 03/07/2024 0729   PLT 430 09/10/2021 1142   MCV 93.6 03/07/2024 0729   MCV 87 09/10/2021 1142   MCH 29.3 03/07/2024 0729   MCHC 31.3 (L) 03/07/2024 0729   RDW 13.7 03/07/2024 0729   RDW 13.4 09/10/2021 1142   LYMPHSABS 1.5 03/01/2023 0744   LYMPHSABS 1.7 09/10/2021 1142   MONOABS 0.5 03/01/2023 0744   EOSABS 23 03/07/2024 0729   EOSABS 0.1 09/10/2021 1142   BASOSABS 29 03/07/2024 0729   BASOSABS 0.0 09/10/2021 1142   Recent broken toe- may be some  inflammatory effect Will continue to watch No smoking  No clotting or bleeding issues       Colon cancer screening   Colonoscopy 06/2022 with 3 year recall

## 2024-03-14 NOTE — Assessment & Plan Note (Signed)
 Lab Results  Component Value Date   VITAMINB12 553 03/07/2024   Continue current supplementation

## 2024-03-14 NOTE — Assessment & Plan Note (Signed)
 bp in fair control at this time  BP Readings from Last 1 Encounters:  03/14/24 118/70   No changes needed Most recent labs reviewed  Disc lifstyle change with low sodium diet and exercise  Plan to continue spironolactone  50 mg daily

## 2024-03-14 NOTE — Assessment & Plan Note (Signed)
 Right middle toe Healing  Still some swelling Not fragility fracture Saw podiatry

## 2024-03-19 ENCOUNTER — Ambulatory Visit: Payer: Self-pay | Admitting: Family Medicine

## 2024-03-19 LAB — CYTOLOGY - PAP
Comment: NEGATIVE
Diagnosis: NEGATIVE
High risk HPV: NEGATIVE

## 2024-04-02 NOTE — Progress Notes (Unsigned)
 SUBJECTIVE: Discussed the use of AI scribe software for clinical note transcription with the patient, who gave verbal consent to proceed.  Chief Complaint: Obesity  Interim History: She is down 3 lbs since her last visit.  Down 24 lbs overall TBW Loss of 9.4%  Ashley Mercado is here to discuss her progress with her obesity treatment plan. She is on the Category 2 Plan and states she is following her eating plan approximately 85 % of the time. She states she is exercising walking  more at work.   Ashley Mercado is a 53 year old female who presents for follow-up on her obesity treatment plan.  She has increased her physical activity, including more walking and general movement.  She reports that her toe fracture, which previously caused pain, is improving, and she is now able to wear regular shoes.  She is currently on Mounjaro  10 mg weekly for T2DM management and obesity management and reports no nausea, vomiting, constipation, diarrhea, difficulty swallowing, or changes in vision or mood. She also takes Topiramate  25 mg occasionally for migraines, with no current need for a refill. She is on a low-dose statin, which she believes was prescribed due to her diabetic status, and reports no issues with it.  Her travel schedule includes early flights and late meetings, impacting her meal timing. She ensures to communicate her dietary needs, such as requiring meals with protein and vegetables, due to her diabetic status. She avoids artificial sweeteners due to an allergy that causes a rash.  She is actively involved in meal prepping at home to manage her diet and her husband's high blood pressure, focusing on low sodium meals. She uses [employer] for convenience but is mindful of their sodium content.  UPC[employer]ING- travel to [facility] for work.  OBJECTIVE: Visit Diagnoses: Problem List Items Addressed This Visit     DM (diabetes mellitus), type 2 (HCC) - Primary   Relevant Medications   tirzepatide   (MOUNJARO ) 10 MG/0.5ML Pen   Vitamin D  deficiency   Low serum vitamin B12   Hyperlipidemia associated with type 2 diabetes mellitus (HCC)   Relevant Medications   tirzepatide  (MOUNJARO ) 10 MG/0.5ML Pen   Long-term current use of injectable noninsulin antidiabetic medication   Generalized obesity- Start BMI 39.9   Relevant Medications   tirzepatide  (MOUNJARO ) 10 MG/0.5ML Pen   Other Visit Diagnoses       Hypertension associated with diabetes (HCC)       Relevant Medications   tirzepatide  (MOUNJARO ) 10 MG/0.5ML Pen     Migraine without status migrainosus, not intractable, unspecified migraine type       Relevant Medications   topiramate  (TOPAMAX ) 25 MG tablet     BMI 36.0-36.9,adult Current BMI 36.1         Obesity Obesity management is ongoing with a focus on weight loss and increased physical activity. She has lost 24 pounds and is incorporating more activity into her routine. She is using Mounjaro  at 10 mg weekly without adverse effects such as nausea, vomiting, constipation, or diarrhea. The use of a weighted vest was discussed to aid in weight-bearing exercises. - Continue Mounjaro  10 mg weekly - Consider purchasing a weighted vest for additional weight-bearing exercise - Encourage continued physical activity and weight management strategies  Type 2 Diabetes Mellitus with hyperglycemia, without long-term current use of insulin  HgbA1c is at goal. Last A1c was 6.0 On statin No ACE or ARB Medication(s): Mounjaro  10 mg SQ weekly and Topiramate  25 mg nightly Denies mass in neck,  dysphagia, dyspepsia, persistent hoarseness, abdominal pain, or N/V/Constipation or diarrhea. Has annual eye exam. Mood is stable.  No Side effects with topiramate   Lab Results  Component Value Date   HGBA1C 6.0 03/07/2024   HGBA1C 5.8 (H) 11/08/2023   HGBA1C 5.9 03/01/2023   Lab Results  Component Value Date   MICROALBUR 1.0 03/07/2024   LDLCALC 76 03/07/2024   CREATININE 1.06 03/07/2024   Lab  Results  Component Value Date   GFR 59.20 (L) 03/07/2024   GFR 68.88 03/01/2023   GFR 71.18 02/26/2022   She is mindful of her dietary intake, especially during travel, and ensures she has appropriate meals to manage her blood sugar levels. A1c at goal of 6 or less.  Plan: Continue and refill Mounjaro  10 mg SQ weekly and Topiramate  25 mg daily Meds ordered this encounter  Medications   tirzepatide  (MOUNJARO ) 10 MG/0.5ML Pen    Sig: Inject 10 mg into the skin once a week.    Dispense:  2 mL    Refill:  2   topiramate  (TOPAMAX ) 25 MG tablet    Sig: Take 2 tablets (50 mg total) by mouth every evening.    Dispense:  60 tablet    Refill:  1      Hypertension Hypertension is well-controlled with current medication regimen. Blood pressure today is 110/78 mmHg, which is within the target range. On spironolactone  50 mg daily.   BP Readings from Last 3 Encounters:  04/03/24 110/78  03/14/24 118/70  03/06/24 123/85   Lab Results  Component Value Date   NA 139 03/07/2024   CL 101 03/07/2024   K 4.9 03/07/2024   CO2 28 03/07/2024   BUN 18 03/07/2024   CREATININE 1.06 03/07/2024   GFR 59.20 (L) 03/07/2024   CALCIUM  9.9 03/07/2024   ALBUMIN 4.6 03/07/2024   GLUCOSE 83 03/07/2024   Continue to work on nutrition plan to promote weight loss and improve BP control.  - Continue current antihypertensive medication regimen  Hyperlipidemia Hyperlipidemia is being managed with a low-dose statin- Crestor  10 mg daily. No SE.  Mounjaro  also contributes to cardiovascular health by reducing the risk of stroke and heart attack .  Lab Results  Component Value Date   CHOL 145 03/07/2024   CHOL 142 11/08/2023   CHOL 139 03/01/2023   Lab Results  Component Value Date   HDL 50.50 03/07/2024   HDL 53 11/08/2023   HDL 53.10 03/01/2023   Lab Results  Component Value Date   LDLCALC 76 03/07/2024   LDLCALC 74 11/08/2023   LDLCALC 69 03/01/2023   Lab Results  Component Value Date   TRIG  93.0 03/07/2024   TRIG 79 11/08/2023   TRIG 88.0 03/01/2023   Lab Results  Component Value Date   CHOLHDL 3 03/07/2024   CHOLHDL 3 03/01/2023   CHOLHDL 2 04/23/2022   No results found for: LDLDIRECT The 10-year ASCVD risk score (Arnett DK, et al., 2019) is: 5.6%   Values used to calculate the score:     Age: 53 years     Clincally relevant sex: Female     Is Non-Hispanic African American: Yes     Diabetic: Yes     Tobacco smoker: No     Systolic Blood Pressure: 110 mmHg     Is BP treated: Yes     HDL Cholesterol: 50.5 mg/dL     Total Cholesterol: 145 mg/dL  LDL slightly above goal, HDL excellent at goal, Trig at goal.  Plan: Continue Crestor  and Mounjaro .  Continue to work on nutrition plan -decreasing simple carbohydrates, increasing lean proteins, decreasing saturated fats and cholesterol , avoiding trans fats and exercise as able to promote weight loss, improve lipids and decrease cardiovascular risks.    Migraine Migraine management includes the use of Topiramate  (Topamax ) as needed. She takes 25 mg occasionally, especially if experiencing more migraines. - Continue Topiramate  25-50 mg as needed for migraines as well as for cravings. Refilled today.   Status post right toe injury, resolving The right toe injury is resolving, with her now able to wear regular shoes and experiencing reduced swelling. Full motion may not be regained, but the condition is improving.  Vitals Temp: 98 F (36.7 C) BP: 110/78 Pulse Rate: 93 SpO2: 98 %   Anthropometric Measurements Height: 5' 7 (1.702 m) Weight: 230 lb (104.3 kg) BMI (Calculated): 36.01 Weight at Last Visit: 233 lb Weight Lost Since Last Visit: 3 lb Weight Gained Since Last Visit: 0 Starting Weight: 254 lb Total Weight Loss (lbs): 24 lb (10.9 kg)   Body Composition  Body Fat %: 45.5 % Fat Mass (lbs): 104.8 lbs Muscle Mass (lbs): 119.2 lbs Total Body Water (lbs): 84.2 lbs Visceral Fat Rating : 13   Other  Clinical Data Fasting: Yes Labs: No Today's Visit #: 36 Starting Date: 09/10/21     ASSESSMENT AND PLAN:  Diet: Donne is currently in the action stage of change. As such, her goal is to continue with weight loss efforts. She has agreed to Category 2 Plan.  Exercise: Dominque has been instructed to work up to a goal of 150 minutes of combined cardio and strengthening exercise per week and consider using a weighted vest for weight loss and overall health benefits.   Behavior Modification:  We discussed the following Behavioral Modification Strategies today: increasing lean protein intake, decreasing simple carbohydrates, increasing vegetables, increase H2O intake, increase high fiber foods, no skipping meals, meal planning and cooking strategies, travel eating strategies, avoiding temptations, and planning for success. We discussed various medication options to help Aniyiah with her weight loss efforts and we both agreed to continue current treatment plan.  Return in about 5 weeks (around 05/08/2024).SABRA She was informed of the importance of frequent follow up visits to maximize her success with intensive lifestyle modifications for her multiple health conditions.  Attestation Statements:   Reviewed by clinician on day of visit: allergies, medications, problem list, medical history, surgical history, family history, social history, and previous encounter notes.   Time spent on visit including pre-visit chart review and post-visit care and charting was 31 minutes.     Jahmeir Geisen, PA-C

## 2024-04-03 ENCOUNTER — Encounter (INDEPENDENT_AMBULATORY_CARE_PROVIDER_SITE_OTHER): Payer: Self-pay | Admitting: Physician Assistant

## 2024-04-03 ENCOUNTER — Ambulatory Visit (INDEPENDENT_AMBULATORY_CARE_PROVIDER_SITE_OTHER): Admitting: Physician Assistant

## 2024-04-03 VITALS — BP 110/78 | HR 93 | Temp 98.0°F | Ht 67.0 in | Wt 230.0 lb

## 2024-04-03 DIAGNOSIS — Z6836 Body mass index (BMI) 36.0-36.9, adult: Secondary | ICD-10-CM

## 2024-04-03 DIAGNOSIS — E1159 Type 2 diabetes mellitus with other circulatory complications: Secondary | ICD-10-CM

## 2024-04-03 DIAGNOSIS — E1169 Type 2 diabetes mellitus with other specified complication: Secondary | ICD-10-CM | POA: Diagnosis not present

## 2024-04-03 DIAGNOSIS — E119 Type 2 diabetes mellitus without complications: Secondary | ICD-10-CM

## 2024-04-03 DIAGNOSIS — E785 Hyperlipidemia, unspecified: Secondary | ICD-10-CM | POA: Diagnosis not present

## 2024-04-03 DIAGNOSIS — Z7985 Long-term (current) use of injectable non-insulin antidiabetic drugs: Secondary | ICD-10-CM

## 2024-04-03 DIAGNOSIS — G43909 Migraine, unspecified, not intractable, without status migrainosus: Secondary | ICD-10-CM

## 2024-04-03 DIAGNOSIS — I152 Hypertension secondary to endocrine disorders: Secondary | ICD-10-CM

## 2024-04-03 DIAGNOSIS — E1165 Type 2 diabetes mellitus with hyperglycemia: Secondary | ICD-10-CM

## 2024-04-03 DIAGNOSIS — E559 Vitamin D deficiency, unspecified: Secondary | ICD-10-CM

## 2024-04-03 DIAGNOSIS — E538 Deficiency of other specified B group vitamins: Secondary | ICD-10-CM

## 2024-04-03 DIAGNOSIS — E669 Obesity, unspecified: Secondary | ICD-10-CM

## 2024-04-03 MED ORDER — TIRZEPATIDE 10 MG/0.5ML ~~LOC~~ SOAJ: 10.0000 mg | SUBCUTANEOUS | 2 refills | Status: DC

## 2024-04-03 MED ORDER — TOPIRAMATE 25 MG PO TABS: 50.0000 mg | ORAL_TABLET | Freq: Every evening | ORAL | 1 refills | Status: DC

## 2024-04-09 ENCOUNTER — Telehealth (INDEPENDENT_AMBULATORY_CARE_PROVIDER_SITE_OTHER): Payer: Self-pay

## 2024-04-09 NOTE — Telephone Encounter (Signed)
 Message from Plan Request Reference Number: [id number].  MOUNJARO  INJ 7.5/0.5 is approved through 04/09/2025.  Your patient may now fill this prescription and it will be covered.. Authorization Expiration Date: April 09, 2025.

## 2024-04-09 NOTE — Telephone Encounter (Signed)
 Ashley Mercado  (Key: [id number]) Mounjaro  7.5MG /0.5ML auto-injectors Wait for Determination Please wait for [facility] 2017 NCPDP to return a determination.

## 2024-04-11 ENCOUNTER — Other Ambulatory Visit

## 2024-04-16 ENCOUNTER — Encounter: Payer: Self-pay | Admitting: Family Medicine

## 2024-05-01 ENCOUNTER — Other Ambulatory Visit: Payer: Self-pay | Admitting: Family Medicine

## 2024-05-07 NOTE — Progress Notes (Unsigned)
 SUBJECTIVE: Discussed the use of AI scribe software for clinical note transcription with the patient, who gave verbal consent to proceed.  Chief Complaint: Obesity  Interim History: She is down 1 lb since her last visit.  Down 25 lbs overall TBW loss of 9.8%  Ashley Mercado is here to discuss her progress with her obesity treatment plan. She is on the Category 2 Plan and states she is following her eating plan approximately 70 % of the time. She states she is exercising walking 30 minutes 3 times per week. Ashley Mercado is a 53 year old female with obesity who presents for a follow-up on her treatment plan.  She is currently on a treatment plan for obesity and has shown progress with a reduction in adipose mass by 1.8 pounds and an increase in muscle mass by 0.6 pounds. She has incorporated more physical activity into her routine, including squats and low-impact workouts like 'Fabulous After Forty' on YouTube, which has led to soreness in her legs.  Her dietary habits include ensuring adequate protein intake, with meals typically at 10 AM and 3 PM, and a snack around 6 or 7 PM. She does not eat breakfast, but does make up for those calories and protein when she does eat. She struggles with adequate hydration. No excessive hunger or cravings.   She is on Mounjaro  10 mg once weekly for type 2 diabetes without any adverse effects such as nausea, vomiting, constipation, diarrhea, or difficulty swallowing. She continues topiramate  50 mg in the evening for migraine prophylaxis, which effectively prevents severe headaches, especially with weather changes. She experiences a sore throat and headache when traveling from cold to warm climates, relieved by sun exposure. She notes a flat taste with carbonated drinks but finds soda water enhances the flavor.  OBJECTIVE: Visit Diagnoses: Problem List Items Addressed This Visit     DM (diabetes mellitus), type 2 (HCC) - Primary   Relevant Medications    tirzepatide  (MOUNJARO ) 10 MG/0.5ML Pen   Hyperlipidemia associated with type 2 diabetes mellitus (HCC)   Relevant Medications   tirzepatide  (MOUNJARO ) 10 MG/0.5ML Pen   Long-term current use of injectable noninsulin antidiabetic medication   Generalized obesity- Start BMI 39.9   Relevant Medications   tirzepatide  (MOUNJARO ) 10 MG/0.5ML Pen   Other Visit Diagnoses       Migraine without status migrainosus, not intractable, unspecified migraine type         BMI 35.0-35.9,adult Current BMI 35.9         Obesity Management is ongoing with positive changes in body composition. Muscle mass has increased by 0.6 pounds, and adipose mass has decreased by 1.8 pounds. Visceral adipose rating has improved to 12. Weight has decreased by almost 10% of body weight.  She is engaging in physical activity, including squats and low-impact workouts, and is mindful of protein intake. She is not consuming enough water, which may affect hydration status. - Continue current exercise regimen, including squats and low-impact workouts. - Encouraged increased water intake to improve hydration. - Continue focusing on protein intake, aiming for 85 grams per day. - Encouraged portion control during meals, especially during Thanksgiving.  Type 2 diabetes mellitus Type 2 diabetes is managed with Mounjaro  10 mg once weekly. A1c is in target range at 6.0, though slightly higher than previous measurements. No adverse effects reported from Mounjaro .Denies mass in neck, dysphagia, dyspepsia, persistent hoarseness, abdominal pain, or N/V/Constipation or diarrhea. Has annual eye exam. Mood is stable.  Lab Results  Component Value  Date   HGBA1C 6.0 03/07/2024   HGBA1C 5.8 (H) 11/08/2023   HGBA1C 5.9 03/01/2023   Lab Results  Component Value Date   MICROALBUR 1.0 03/07/2024   LDLCALC 76 03/07/2024   CREATININE 1.06 03/07/2024   INSULIN   Date Value Ref Range Status  11/08/2023 18.5 2.6 - 24.9 uIU/mL Final  ]She is  working  on nutrition plan to decrease simple carbohydrates, increase lean proteins and exercise to promote weight loss and improve glycemic control . - Refilled/continue Mounjaro  10 mg weekly prescription. Meds ordered this encounter  Medications   tirzepatide  (MOUNJARO ) 10 MG/0.5ML Pen    Sig: Inject 10 mg into the skin once a week.    Dispense:  2 mL    Refill:  2     Hyperlipidemia Managed with rosuvastatin  10 mg daily.No reported SE.  Lab Results  Component Value Date   CHOL 145 03/07/2024   CHOL 142 11/08/2023   CHOL 139 03/01/2023   Lab Results  Component Value Date   HDL 50.50 03/07/2024   HDL 53 11/08/2023   HDL 53.10 03/01/2023   Lab Results  Component Value Date   LDLCALC 76 03/07/2024   LDLCALC 74 11/08/2023   LDLCALC 69 03/01/2023   Lab Results  Component Value Date   TRIG 93.0 03/07/2024   TRIG 79 11/08/2023   TRIG 88.0 03/01/2023   Lab Results  Component Value Date   CHOLHDL 3 03/07/2024   CHOLHDL 3 03/01/2023   CHOLHDL 2 04/23/2022   No results found for: LDLDIRECT;I LDL slightly above goal, HDL just above goal. Trig at goal.  - Continue rosuvastatin  10 mg daily. Continue Mounjaro  which should also decrease CV risks Continue to work on nutrition plan -decreasing simple carbohydrates, increasing lean proteins, decreasing saturated fats and cholesterol , avoiding trans fats and exercise as able to promote weight loss, improve lipids and decrease cardiovascular risks.   Migraine prophylaxis Managed with topiramate  50 mg in the evening. She reports effectiveness in preventing severe headaches, especially during weather changes. No adverse effects reported, except for a flat taste with carbonated drinks. Topiramate  can also be helpful with cravings.  - Continue topiramate  50 mg in the evening.No refill needed this visit.  Could increase dose if needed in future.     Vitals Temp: 97.6 F (36.4 C) BP: 103/72 Pulse Rate: 64 SpO2: 96  %   Anthropometric Measurements Height: 5' 7 (1.702 m) Weight: 229 lb (103.9 kg) BMI (Calculated): 35.86 Weight at Last Visit: 230 lb Weight Lost Since Last Visit: 1 lb Weight Gained Since Last Visit: 0 Starting Weight: 254 lb Total Weight Loss (lbs): 25 lb (11.3 kg) Peak Weight: 254 lb   Body Composition  Body Fat %: 44.9 % Fat Mass (lbs): 103 lbs Muscle Mass (lbs): 119.8 lbs Total Body Water (lbs): 82.8 lbs Visceral Fat Rating : 12   Other Clinical Data Fasting: No Labs: No Today's Visit #: 37 Starting Date: 09/10/21     ASSESSMENT AND PLAN:  Diet: Ashley Mercado is currently in the action stage of change. As such, her goal is to continue with weight loss efforts. She has agreed to Category 2 Plan.  Exercise: Ashley Mercado has been instructed to work up to a goal of 150 minutes of combined cardio and strengthening exercise per week for weight loss and overall health benefits.   Behavior Modification:  We discussed the following Behavioral Modification Strategies today: increasing lean protein intake, decreasing simple carbohydrates, increasing vegetables, increase H2O intake, increase high fiber  foods, meal planning and cooking strategies, avoiding temptations, and planning for success. We discussed various medication options to help Ashley Mercado with her weight loss efforts and we both agreed to continue current treatment plan.  Return in about 4 weeks (around 06/05/2024).SABRA She was informed of the importance of frequent follow up visits to maximize her success with intensive lifestyle modifications for her multiple health conditions.  Attestation Statements:   Reviewed by clinician on day of visit: allergies, medications, problem list, medical history, surgical history, family history, social history, and previous encounter notes.   Time spent on visit including pre-visit chart review and post-visit care and charting was 32 minutes.    Montina Dorrance, PA-C

## 2024-05-08 ENCOUNTER — Ambulatory Visit (INDEPENDENT_AMBULATORY_CARE_PROVIDER_SITE_OTHER): Payer: Self-pay | Admitting: Physician Assistant

## 2024-05-08 ENCOUNTER — Encounter (INDEPENDENT_AMBULATORY_CARE_PROVIDER_SITE_OTHER): Payer: Self-pay | Admitting: Physician Assistant

## 2024-05-08 VITALS — BP 103/72 | HR 64 | Temp 97.6°F | Ht 67.0 in | Wt 229.0 lb

## 2024-05-08 DIAGNOSIS — E1169 Type 2 diabetes mellitus with other specified complication: Secondary | ICD-10-CM | POA: Diagnosis not present

## 2024-05-08 DIAGNOSIS — E559 Vitamin D deficiency, unspecified: Secondary | ICD-10-CM

## 2024-05-08 DIAGNOSIS — E669 Obesity, unspecified: Secondary | ICD-10-CM

## 2024-05-08 DIAGNOSIS — E1165 Type 2 diabetes mellitus with hyperglycemia: Secondary | ICD-10-CM | POA: Diagnosis not present

## 2024-05-08 DIAGNOSIS — E785 Hyperlipidemia, unspecified: Secondary | ICD-10-CM

## 2024-05-08 DIAGNOSIS — E1159 Type 2 diabetes mellitus with other circulatory complications: Secondary | ICD-10-CM

## 2024-05-08 DIAGNOSIS — G43909 Migraine, unspecified, not intractable, without status migrainosus: Secondary | ICD-10-CM | POA: Diagnosis not present

## 2024-05-08 DIAGNOSIS — Z7985 Long-term (current) use of injectable non-insulin antidiabetic drugs: Secondary | ICD-10-CM

## 2024-05-08 DIAGNOSIS — Z6835 Body mass index (BMI) 35.0-35.9, adult: Secondary | ICD-10-CM

## 2024-05-08 MED ORDER — TIRZEPATIDE 10 MG/0.5ML ~~LOC~~ SOAJ: 10.0000 mg | SUBCUTANEOUS | 2 refills | Status: DC

## 2024-06-04 NOTE — Progress Notes (Unsigned)
 SUBJECTIVE: Discussed the use of AI scribe software for clinical note transcription with the patient, who gave verbal consent to proceed.  Chief Complaint: Obesity  Interim History: She is up 5 lbs since her last visit.  Down 20 lbs overall TBW loss of 7.9%  Ashley Mercado is here to discuss her progress with her obesity treatment plan. She is on the Category 2 Plan and states she is following her eating plan approximately 50 % of the time. She states she is exercising High NEAT at work.  Ashley Mercado is a 53 year old female who presents for follow-up of her obesity treatment plan.  She is up 5 lbs since the last visit, with a total weight loss of twenty pounds. She follows a category two plan approximately fifty percent of the time. Her protein intake is adequate, but she inconsistently consumes whole foods and has insufficient water intake. She occasionally skips meals- tends to always skip breakfast.  She engages in significant walking during her workday.  She is currently taking Mounjaro  10 mg once weekly for type 2 diabetes management. She skips meals and does not eat breakfast regularly. We discussed the importance of regular meals and adequate protein intake for weight loss and overall health.  Her lunch typically consists of taco meat with lettuce and tomatoes, avoiding taco shells, and sometimes a baked potato if cafeteria options are unappealing. She usually brings lunch from home if she knows it is something she will not eat .  Dinner is more structured, often including lean meats and vegetables.  She is on topiramate  25 mg every evening for migraine prophylaxis and to help with cravings and emotional eating. We discussed increasing to 50 mg to help more with cravings. She experiences cravings for sweets, particularly during the holiday season, and indulges in holiday drinks like hot chocolate and apple cider. She forgot to take her medications for a week recently, which may have affected  her cravings and weight.  She is on rosuvastatin  10 mg once daily for hyperlipidemia. She consumes higher saturated fat meats like pork butt and beef.  OBJECTIVE: Visit Diagnoses: Problem List Items Addressed This Visit     Morbid obesity (HCC)   DM (diabetes mellitus), type 2 (HCC) - Primary   Hyperlipidemia associated with type 2 diabetes mellitus (HCC)   Long-term current use of injectable noninsulin antidiabetic medication   Generalized obesity- Start BMI 39.9   Other Visit Diagnoses       Migraine without status migrainosus, not intractable, unspecified migraine type         BMI 36.0-36.9,adult Current BMI 36.6         Obesity Total weight loss of 20 pounds. Following a category two plan approximately 50% of the time. Adequate protein intake but inconsistent with whole foods and water consumption. Skipping meals, particularly breakfast. Increased adipose mass likely due to recent dietary choices during Thanksgiving. Cravings for sweets and simple carbohydrates noted. - Increase topiramate  dosage to help with cravings. - Focus on lean meats and vegetables, avoiding simple carbohydrates. - Avoid skipping meals, especially breakfast. Consider protein shake for breakfast.  - Consider baked apples as a substitute for sweets. - Monitor beverage intake, opting for lower calorie and sugar options.  Type 2 diabetes mellitus Managed with Mounjaro  10 mg once weekly. Recent A1c slightly increased but not significantly. Recent lapse in Mounjaro  due to forgetting to bring medication, which may have affected metabolism and weight.  Lab Results  Component Value Date   HGBA1C  6.0 03/07/2024   HGBA1C 5.8 (H) 11/08/2023   HGBA1C 5.9 03/01/2023   Lab Results  Component Value Date   MICROALBUR 1.0 03/07/2024   LDLCALC 76 03/07/2024   CREATININE 1.06 03/07/2024   She is working  on nutrition plan to decrease simple carbohydrates, increase lean proteins and exercise to promote weight loss and  improve glycemic control . - Continue Mounjaro  10 mg once weekly.- She has refills on current Rx.  - Will plan for fasting labs early next year to monitor diabetes control.  Migraines Headaches managed with topiramate  25 mg every evening for prophylaxis and to assist with cravings and emotional eating. Recent lapse in medication due to forgetting to bring it. - Increased topiramate  dosage to 50 mg in evening and monitor tolerance to help with cravings and migraine prophylaxis.  Hyperlipidemia Managed with rosuvastatin  10 mg once daily. No reported SE.  Lab Results  Component Value Date   CHOL 145 03/07/2024   CHOL 142 11/08/2023   CHOL 139 03/01/2023   Lab Results  Component Value Date   HDL 50.50 03/07/2024   HDL 53 11/08/2023   HDL 53.10 03/01/2023   Lab Results  Component Value Date   LDLCALC 76 03/07/2024   LDLCALC 74 11/08/2023   LDLCALC 69 03/01/2023   Lab Results  Component Value Date   TRIG 93.0 03/07/2024   TRIG 79 11/08/2023   TRIG 88.0 03/01/2023   Lab Results  Component Value Date   CHOLHDL 3 03/07/2024   CHOLHDL 3 03/01/2023   CHOLHDL 2 04/23/2022   No results found for: LDLDIRECT The 10-year ASCVD risk score (Arnett DK, et al., 2019) is: 5.4%   Values used to calculate the score:     Age: 36 years     Clincally relevant sex: Female     Is Non-Hispanic African American: Yes     Diabetic: Yes     Tobacco smoker: No     Systolic Blood Pressure: 109 mmHg     Is BP treated: Yes     HDL Cholesterol: 50.5 mg/dL     Total Cholesterol: 145 mg/dL  - Continue rosuvastatin  10 mg once daily. Continue to work on nutrition plan -decreasing simple carbohydrates, increasing lean proteins, decreasing saturated fats and cholesterol , avoiding trans fats and exercise as able to promote weight loss, improve lipids and decrease cardiovascular risks.   Vitals Temp: 97.7 F (36.5 C) BP: 109/77 Pulse Rate: 90 SpO2: 100 %   Anthropometric Measurements Height:  5' 7 (1.702 m) Weight: 234 lb (106.1 kg) BMI (Calculated): 36.64 Weight at Last Visit: 229 lbs Weight Lost Since Last Visit: 0 Weight Gained Since Last Visit: 5 lbs Starting Weight: 254 lbs Total Weight Loss (lbs): 20 lb (9.072 kg) Peak Weight: 254 lbs   Body Composition  Body Fat %: 46.6 % Fat Mass (lbs): 109 lbs Muscle Mass (lbs): 118.6 lbs Total Body Water (lbs): 86 lbs Visceral Fat Rating : 13   Other Clinical Data Fasting: no Labs: no Today's Visit #: 38 Starting Date: 09/10/21     ASSESSMENT AND PLAN:  Diet: Joyelle is currently in the action stage of change. As such, her goal is to continue with weight loss efforts. She has agreed to Category 2 Plan.  Exercise: Lenna has been instructed to work up to a goal of 150 minutes of combined cardio and strengthening exercise per week for weight loss and overall health benefits.   Behavior Modification:  We discussed the following Behavioral Modification Strategies  today: increasing lean protein intake, decreasing simple carbohydrates, increasing vegetables, increase H2O intake, increase high fiber foods, meal planning and cooking strategies, holiday eating strategies, avoiding temptations, and planning for success. We discussed various medication options to help [place] with her weight loss efforts and we both agreed to continue Mounjaro  10 mg weekly and topiramate  for migraine and emotional eating.  Return in about 4 weeks (around 07/03/2024).SABRA She was informed of the importance of frequent follow up visits to maximize her success with intensive lifestyle modifications for her multiple health conditions.  Attestation Statements:   Reviewed by clinician on day of visit: allergies, medications, problem list, medical history, surgical history, family history, social history, and previous encounter notes.   Time spent on visit including pre-visit chart review and post-visit care and charting was 31 minutes.    Muadh Creasy, PA-C

## 2024-06-05 ENCOUNTER — Encounter (INDEPENDENT_AMBULATORY_CARE_PROVIDER_SITE_OTHER): Payer: Self-pay | Admitting: Physician Assistant

## 2024-06-05 ENCOUNTER — Ambulatory Visit (INDEPENDENT_AMBULATORY_CARE_PROVIDER_SITE_OTHER): Payer: Self-pay | Admitting: Physician Assistant

## 2024-06-05 VITALS — BP 109/77 | HR 90 | Temp 97.7°F | Ht 67.0 in | Wt 234.0 lb

## 2024-06-05 DIAGNOSIS — E559 Vitamin D deficiency, unspecified: Secondary | ICD-10-CM

## 2024-06-05 DIAGNOSIS — E1165 Type 2 diabetes mellitus with hyperglycemia: Secondary | ICD-10-CM | POA: Diagnosis not present

## 2024-06-05 DIAGNOSIS — E785 Hyperlipidemia, unspecified: Secondary | ICD-10-CM

## 2024-06-05 DIAGNOSIS — E669 Obesity, unspecified: Secondary | ICD-10-CM

## 2024-06-05 DIAGNOSIS — G43909 Migraine, unspecified, not intractable, without status migrainosus: Secondary | ICD-10-CM | POA: Diagnosis not present

## 2024-06-05 DIAGNOSIS — E1169 Type 2 diabetes mellitus with other specified complication: Secondary | ICD-10-CM | POA: Diagnosis not present

## 2024-06-05 DIAGNOSIS — Z7985 Long-term (current) use of injectable non-insulin antidiabetic drugs: Secondary | ICD-10-CM

## 2024-06-05 DIAGNOSIS — Z6836 Body mass index (BMI) 36.0-36.9, adult: Secondary | ICD-10-CM

## 2024-06-05 DIAGNOSIS — E1159 Type 2 diabetes mellitus with other circulatory complications: Secondary | ICD-10-CM

## 2024-06-11 ENCOUNTER — Other Ambulatory Visit: Payer: Self-pay | Admitting: Family Medicine

## 2024-06-11 DIAGNOSIS — Z1231 Encounter for screening mammogram for malignant neoplasm of breast: Secondary | ICD-10-CM

## 2024-07-02 NOTE — Progress Notes (Unsigned)
 "  SUBJECTIVE: Discussed the use of AI scribe software for clinical note transcription with the patient, who gave verbal consent to proceed.  Chief Complaint: Obesity  Interim History: She is down 9 lbs since last visit.  Down 29 lbs overall TBW loss of 11.4% Ashley Mercado is here to discuss her progress with her obesity treatment plan. She is on the Category 2 Plan and states she is following her eating plan approximately 70 % of the time. She states she is exercising walking/weight training 15 minutes 3 times per week. Ashley Mercado is a 53 year old female who presents for follow-up of her obesity treatment plan.  She has successfully lost 29 pounds through dietary changes, such as increasing protein intake and choosing healthier snacks like beef jerky over chips or candy. She manages portion sizes by sharing treats like cupcakes with her husband. Her physical activity has increased with the use of a treadmill and weight lifting, although she experiences some neck discomfort when lifting overhead.  She has a history of type two diabetes and is currently on Mounjaro  10 mg once weekly. She experiences no side effects such as nausea, vomiting, constipation, or diarrhea from the medication. She feels more energetic. She is also working on increasing her water intake.  For hyperlipidemia, she takes rosuvastatin  10 mg once daily without any reported issues.  She is on topiramate  25 to 50 mg in the evening for migraine prophylaxis and to help with cravings. She has not experienced any migraines despite significant weather changes and does not experience side effects like fatigue or brain fog from the medication.  Her past medical history includes vitamin D  and B12 deficiencies, for which she takes a daily multivitamin gummy to ensure consistent vitamin intake.  In terms of social history, she enjoys camping with her husband and she owns an RV, which she uses for trips. She is currently off work but plans  to return soon.  Fasting labs planned for next OV  OBJECTIVE: Visit Diagnoses: Problem List Items Addressed This Visit     DM (diabetes mellitus), type 2 (HCC) - Primary   Relevant Medications   tirzepatide  (MOUNJARO ) 10 MG/0.5ML Pen   Hyperlipidemia associated with type 2 diabetes mellitus (HCC)   Relevant Medications   tirzepatide  (MOUNJARO ) 10 MG/0.5ML Pen   Long-term current use of injectable noninsulin antidiabetic medication   Generalized obesity- Start BMI 39.9   Relevant Medications   tirzepatide  (MOUNJARO ) 10 MG/0.5ML Pen   Other Visit Diagnoses       Migraine without status migrainosus, not intractable, unspecified migraine type       Relevant Medications   topiramate  (TOPAMAX ) 25 MG tablet     BMI 35.0-35.9,adult Current BMI 35.3          Obesity Management is progressing well with a weight loss of 29 pounds.  Muscle mass has increased by 1.8 pounds, and adipose mass has decreased by 10.6 pounds. Great work !!!  Body adipose percentage has decreased from 46.6% to 43.7%, with a goal of 35% or less. She has been successful in maintaining dietary changes and increasing physical activity, including weight lifting and treadmill use. No side effects reported from Mounjaro  10 mg weekly. - Continue Mounjaro  10 mg weekly for T2DM. - Encouraged continued dietary modifications and physical activity. - Will consider reducing Mounjaro  dose or adjusting dosing frequency if weight maintenance is achieved.   Type 2 Diabetes Mellitus with hyperglycemia, without long-term current use of insulin  HgbA1c is at goal. Last A1c  was 6.0 On statin therapy Medication(s): Mounjaro  10 mg SQ weekly Denies mass in neck, dysphagia, dyspepsia, persistent hoarseness, abdominal pain, or N/V/Constipation or diarrhea. Has annual eye exam. Mood is stable.  Well-controlled with Mounjaro , as evidenced by a decrease in A1c to 6 %. No side effects reported from Mounjaro .  - Continue Mounjaro  10 mg  weekly. - Continue rosuvastatin  10 mg daily.  Lab Results  Component Value Date   HGBA1C 6.0 03/07/2024   HGBA1C 5.8 (H) 11/08/2023   HGBA1C 5.9 03/01/2023   Lab Results  Component Value Date   MICROALBUR 1.0 03/07/2024   LDLCALC 76 03/07/2024   CREATININE 1.06 03/07/2024   Lab Results  Component Value Date   GFR 59.20 (L) 03/07/2024   GFR 68.88 03/01/2023   GFR 71.18 02/26/2022    Plan: She is working  on nutrition plan to decrease simple carbohydrates, increase lean proteins and exercise to promote weight loss and improve glycemic control . Continue and refill Mounjaro  10 mg SQ weekly - Plan fasting labs for February 3rd, including lipids, vitamin levels, A1c, insulin  level, and chemistry panel. Meds ordered this encounter  Medications   topiramate  (TOPAMAX ) 25 MG tablet    Sig: Take 2 tablets (50 mg total) by mouth every evening.    Dispense:  60 tablet    Refill:  1   tirzepatide  (MOUNJARO ) 10 MG/0.5ML Pen    Sig: Inject 10 mg into the skin once a week.    Dispense:  2 mL    Refill:  2     Hyperlipidemia LDL is not at goal. Medication(s): rosuvastatin  10 mg daily. No SE.  Cardiovascular risk factors: diabetes mellitus, dyslipidemia, and obesity (BMI >= 30 kg/m2)  Lab Results  Component Value Date   CHOL 145 03/07/2024   HDL 50.50 03/07/2024   LDLCALC 76 03/07/2024   TRIG 93.0 03/07/2024   CHOLHDL 3 03/07/2024   CHOLHDL 3 03/01/2023   CHOLHDL 2 04/23/2022   Lab Results  Component Value Date   ALT 23 03/07/2024   AST 22 03/07/2024   ALKPHOS 100 03/07/2024   BILITOT 0.7 03/07/2024   The 10-year ASCVD risk score (Arnett DK, et al., 2019) is: 5.4%   Values used to calculate the score:     Age: 78 years     Clinically relevant sex: Female     Is Non-Hispanic African American: Yes     Diabetic: Yes     Tobacco smoker: No     Systolic Blood Pressure: 109 mmHg     Is BP treated: Yes     HDL Cholesterol: 50.5 mg/dL     Total Cholesterol: 145  mg/dL  Plan: Continue rosuvastin Continue Mounjaro  which should lower CV risks Continue to work on nutrition plan -decreasing simple carbohydrates, increasing lean proteins, decreasing saturated fats and cholesterol , avoiding trans fats and exercise as able to promote weight loss, improve lipids and decrease cardiovascular risks.   Migraine HA On topiramate  for HA prophylaxis.  No SE such as word finding difficulties , no fatigue or brain fog.   Plan: Continue topiramate  50 mg daily Reports no HA with weather changes over the past few months .   Vitals Temp: 97.8 F (36.6 C) BP: 109/78 Pulse Rate: 97 SpO2: 99 %   Anthropometric Measurements Height: 5' 7 (1.702 m) Weight: 225 lb (102.1 kg) BMI (Calculated): 35.23 Weight at Last Visit: 234lb Weight Lost Since Last Visit: 9lb Weight Gained Since Last Visit: 0lb Starting Weight: 254lb Total Weight  Loss (lbs): 29 lb (13.2 kg) Peak Weight: 254lb   Body Composition  Body Fat %: 43.7 % Fat Mass (lbs): 98.4 lbs Muscle Mass (lbs): 120.4 lbs Total Body Water (lbs): 80.6 lbs Visceral Fat Rating : 12   Other Clinical Data Fasting: yes Labs: no Today's Visit #: 39 Starting Date: 09/10/21     ASSESSMENT AND PLAN:  Diet: Shanasia is currently in the action stage of change. As such, her goal is to continue with weight loss efforts. She has agreed to Category 2 Plan.  Exercise: Ashley Mercado has been instructed to work up to a goal of 150 minutes of combined cardio and strengthening exercise per week for weight loss and overall health benefits.   Behavior Modification:  We discussed the following Behavioral Modification Strategies today: increasing lean protein intake, decreasing simple carbohydrates, increasing vegetables, increase H2O intake, increase high fiber foods, meal planning and cooking strategies, holiday eating strategies, avoiding temptations, and planning for success. We discussed various medication options to  help Jeidi with her weight loss efforts and we both agreed to continue Mounjaro  10 mg weekly for T2DM.  Return in about 5 weeks (around 08/08/2024).SABRA She was informed of the importance of frequent follow up visits to maximize her success with intensive lifestyle modifications for her multiple health conditions.  Attestation Statements:   Reviewed by clinician on day of visit: allergies, medications, problem list, medical history, surgical history, family history, social history, and previous encounter notes.   Time spent on visit including pre-visit chart review and post-visit care and charting was 20 minutes.    Evelyna Folker, PA-C  "

## 2024-07-03 ENCOUNTER — Ambulatory Visit (INDEPENDENT_AMBULATORY_CARE_PROVIDER_SITE_OTHER): Admitting: Physician Assistant

## 2024-07-04 ENCOUNTER — Ambulatory Visit (INDEPENDENT_AMBULATORY_CARE_PROVIDER_SITE_OTHER): Admitting: Physician Assistant

## 2024-07-04 ENCOUNTER — Encounter (INDEPENDENT_AMBULATORY_CARE_PROVIDER_SITE_OTHER): Payer: Self-pay | Admitting: Physician Assistant

## 2024-07-04 VITALS — BP 109/78 | HR 97 | Temp 97.8°F | Ht 67.0 in | Wt 225.0 lb

## 2024-07-04 DIAGNOSIS — Z7985 Long-term (current) use of injectable non-insulin antidiabetic drugs: Secondary | ICD-10-CM

## 2024-07-04 DIAGNOSIS — E785 Hyperlipidemia, unspecified: Secondary | ICD-10-CM

## 2024-07-04 DIAGNOSIS — G43909 Migraine, unspecified, not intractable, without status migrainosus: Secondary | ICD-10-CM

## 2024-07-04 DIAGNOSIS — E1169 Type 2 diabetes mellitus with other specified complication: Secondary | ICD-10-CM | POA: Diagnosis not present

## 2024-07-04 DIAGNOSIS — Z6835 Body mass index (BMI) 35.0-35.9, adult: Secondary | ICD-10-CM | POA: Diagnosis not present

## 2024-07-04 DIAGNOSIS — E1165 Type 2 diabetes mellitus with hyperglycemia: Secondary | ICD-10-CM

## 2024-07-04 DIAGNOSIS — E559 Vitamin D deficiency, unspecified: Secondary | ICD-10-CM

## 2024-07-04 DIAGNOSIS — I152 Hypertension secondary to endocrine disorders: Secondary | ICD-10-CM

## 2024-07-04 DIAGNOSIS — E669 Obesity, unspecified: Secondary | ICD-10-CM | POA: Diagnosis not present

## 2024-07-04 MED ORDER — TOPIRAMATE 25 MG PO TABS: 50.0000 mg | ORAL_TABLET | Freq: Every evening | ORAL | 1 refills | Status: DC

## 2024-07-04 MED ORDER — TIRZEPATIDE 10 MG/0.5ML ~~LOC~~ SOAJ: 10.0000 mg | SUBCUTANEOUS | 2 refills | Status: DC

## 2024-07-10 ENCOUNTER — Ambulatory Visit: Admission: RE | Admit: 2024-07-10 | Discharge: 2024-07-10 | Disposition: A | Source: Ambulatory Visit

## 2024-07-10 DIAGNOSIS — Z1231 Encounter for screening mammogram for malignant neoplasm of breast: Secondary | ICD-10-CM

## 2024-07-11 ENCOUNTER — Ambulatory Visit: Payer: Self-pay | Admitting: Family Medicine

## 2024-08-06 ENCOUNTER — Encounter (INDEPENDENT_AMBULATORY_CARE_PROVIDER_SITE_OTHER): Payer: Self-pay | Admitting: Physician Assistant

## 2024-08-06 ENCOUNTER — Encounter (INDEPENDENT_AMBULATORY_CARE_PROVIDER_SITE_OTHER): Payer: Self-pay | Admitting: *Deleted

## 2024-08-06 ENCOUNTER — Telehealth (INDEPENDENT_AMBULATORY_CARE_PROVIDER_SITE_OTHER): Admitting: Physician Assistant

## 2024-08-06 VITALS — Ht 67.0 in | Wt 225.0 lb

## 2024-08-06 DIAGNOSIS — Z6835 Body mass index (BMI) 35.0-35.9, adult: Secondary | ICD-10-CM | POA: Diagnosis not present

## 2024-08-06 DIAGNOSIS — Z7985 Long-term (current) use of injectable non-insulin antidiabetic drugs: Secondary | ICD-10-CM | POA: Diagnosis not present

## 2024-08-06 DIAGNOSIS — G43909 Migraine, unspecified, not intractable, without status migrainosus: Secondary | ICD-10-CM

## 2024-08-06 DIAGNOSIS — E1165 Type 2 diabetes mellitus with hyperglycemia: Secondary | ICD-10-CM | POA: Diagnosis not present

## 2024-08-06 DIAGNOSIS — E669 Obesity, unspecified: Secondary | ICD-10-CM | POA: Diagnosis not present

## 2024-08-06 MED ORDER — TOPIRAMATE 25 MG PO TABS: 50.0000 mg | ORAL_TABLET | Freq: Every evening | ORAL | 1 refills | Status: AC

## 2024-08-06 MED ORDER — TIRZEPATIDE 10 MG/0.5ML ~~LOC~~ SOAJ: 10.0000 mg | SUBCUTANEOUS | 2 refills | Status: AC

## 2024-08-06 NOTE — Progress Notes (Signed)
 TeleHealth Visit:  This visit was completed with telemedicine (audio/video) technology. Jeylin has verbally consented to this TeleHealth visit. The patient is located at home, the provider is located at home. The participants in this visit include the listed provider and patient. The visit was conducted today via MyChart video.   SUBJECTIVE: Discussed the use of AI scribe software for clinical note transcription with the patient, who gave verbal consent to proceed.  Chief Complaint: Obesity  Interim History:   Do you feel as if you have MAINTAINED weight? She has maintained.   Plan Cat 2  Percent Following 70%  Are you exercising?: NO  What type of exercise N/A  Days Minutes   Do you check your BP at home? NO  Medication Refills Mounjaro  and Topamax   Pharmacy [facility]  Are you drinking enough water? NO  Skipping Meals? NO  Getting Recommended amount of protein? YES   Will anyone else be on the call with you? NO   Sleeping between 7-9 hours per night? YES     Mariska Daffin is a 54 year old female with obesity who presents for follow-up of her obesity treatment plan.  Her weight remains stable at around 225 pounds. She adheres to her meal plan but notes no significant change in weight. She consumes a lot of soup and chili and has been making healthier food choices, such as opting for sweet bell peppers over sweets.  She reports that she is currently taking Mounjaro  10 mg weekly for T2DM and topiramate  50 mg at bedtime for migraine prophylaxis, but should also help with cravings and feels less interested in sweets. She describes an incident where she resisted eating large portions of cake, opting instead to share it with her husband. She notes increased energy and has been able to shovel snow and perform other physical activities without fatigue.  She sleeps well at night, going to bed around 8:30 or 9:00 PM and waking up naturally around 3:30 or 4:00 AM to get ready to go  to work by 5 am. No fatigue during the day.  She recalls a fall during a previous snowstorm but reports no injuries from the incident and was able to continue her daily activities without issue.    Current Outpatient Medications  Medication Instructions   albuterol (VENTOLIN HFA) 108 (90 Base) MCG/ACT inhaler 2 puffs, Every 6 hours PRN   Azelastine  HCl 137 MCG/SPRAY SOLN Place into both nostrils.   EPINEPHrine 0.3 mg/0.3 mL IJ SOAJ injection See admin instructions   fluticasone  (FLONASE ) 50 MCG/ACT nasal spray Place into both nostrils.   levocetirizine (XYZAL) 2.5 mg   montelukast (SINGULAIR) 10 mg, Daily   Multiple Vitamins-Minerals (MULTIVITAMIN GUMMIES ADULT PO) 2 each, Daily   rosuvastatin  (CRESTOR ) 10 mg, Oral, Daily   spironolactone  (ALDACTONE ) 50 mg, Oral, Daily   tirzepatide  (MOUNJARO ) 10 mg, Subcutaneous, Weekly   topiramate  (TOPAMAX ) 50 mg, Oral, Every evening    OBJECTIVE: Visit Diagnoses: Problem List Items Addressed This Visit     DM (diabetes mellitus), type 2 (HCC) - Primary   Relevant Medications   tirzepatide  (MOUNJARO ) 10 MG/0.5ML Pen   Generalized obesity- Start BMI 39.9   Relevant Medications   tirzepatide  (MOUNJARO ) 10 MG/0.5ML Pen   Other Visit Diagnoses       Migraine without status migrainosus, not intractable, unspecified migraine type       Relevant Medications   topiramate  (TOPAMAX ) 25 MG tablet     BMI 35.0-35.9,adult Current BMI 35.2  Obesity Weight remains at 225 lbs. Appetite suppression with Mounjaro  and topiramate  is effective, particularly in reducing cravings for sweets. Increased energy levels allow for physical activity, such as shoveling snow. No significant side effects from topiramate , including fatigue. - Continue current nutrition plan and think about incorporating exercise/strength training at gym - Continue Mounjaro  10 mg for primary indication of T2DM. - Continue topiramate  50 mg nightly for migraine prophylaxis. -  Sent refills for Mounjaro  and topiramate  to [facility] in [place]. - Scheduled fasting labs for March 10th at 7:30 AM.  Type 2 Diabetes Mellitus with hyperglycemia, without long-term current use of insulin  HgbA1c is at goal. Last A1c was 6.0  Medication(s): Mounjaro  10 mg SQ weekly Denies mass in neck, dysphagia, dyspepsia, persistent hoarseness, abdominal pain, or N/V/Constipation or diarrhea. Has annual eye exam. Mood is stable.    Lab Results  Component Value Date   HGBA1C 6.0 03/07/2024   HGBA1C 5.8 (H) 11/08/2023   HGBA1C 5.9 03/01/2023   Lab Results  Component Value Date   MICROALBUR 1.0 03/07/2024   LDLCALC 76 03/07/2024   CREATININE 1.06 03/07/2024   Lab Results  Component Value Date   GFR 59.20 (L) 03/07/2024   GFR 68.88 03/01/2023   GFR 71.18 02/26/2022    Plan: Continue and refill Mounjaro  10 mg SQ weekly She is working  on nutrition plan to decrease simple carbohydrates, increase lean proteins and exercise to promote weight loss and improve glycemic control .  Migraine Topiramate  is used for migraine prophylaxis and effectively reduces cravings for sweets, which may contribute to migraine management. - Continue topiramate  for migraine prophylaxis as well as emotional eating/cravings.  Meds ordered this encounter  Medications   tirzepatide  (MOUNJARO ) 10 MG/0.5ML Pen    Sig: Inject 10 mg into the skin once a week.    Dispense:  2 mL    Refill:  2   topiramate  (TOPAMAX ) 25 MG tablet    Sig: Take 2 tablets (50 mg total) by mouth every evening.    Dispense:  60 tablet    Refill:  1    No data recorded Anthropometric Measurements Height: 5' 7 (1.702 m) Weight: 225 lb (102.1 kg) (Virtual Visit) BMI (Calculated): 35.23 Starting Weight: 254 lb Peak Weight: 254 lb   No data recorded Other Clinical Data Fasting: N/A Labs: N/A Today's Visit #: 40 Starting Date: 09/10/21 Comments: [facility] VIDEO VISIT     ASSESSMENT AND PLAN:  Diet: Analeah is  currently in the action stage of change. As such, her goal is to continue with weight loss efforts. She has agreed to Category 2 Plan.  Exercise: Monzerrath has been instructed to work up to a goal of 150 minutes of combined cardio and strengthening exercise per week for weight loss and overall health benefits.   Behavior Modification:  We discussed the following Behavioral Modification Strategies today: increasing lean protein intake, decreasing simple carbohydrates, increasing vegetables, increase H2O intake, increase high fiber foods, and meal planning and cooking strategies. We discussed various medication options to help Alejandra with her weight loss efforts and we both agreed to continue Mounjaro  10 mg weekly for T2DM and topiramate  for migraine prophylaxis,and also for cravings .  Return in about 4 weeks (around 09/03/2024).SABRA She was informed of the importance of frequent follow up visits to maximize her success with intensive lifestyle modifications for her multiple health conditions.  Attestation Statements:   Reviewed by clinician on day of visit: allergies, medications, problem list, medical history, surgical history, family history,  social history, and previous encounter notes.   Time spent on visit including pre-visit chart review and post-visit care and charting was 37 minutes.    Krista Som, PA-C

## 2024-08-07 ENCOUNTER — Ambulatory Visit (INDEPENDENT_AMBULATORY_CARE_PROVIDER_SITE_OTHER): Admitting: Physician Assistant

## 2024-09-11 ENCOUNTER — Ambulatory Visit (INDEPENDENT_AMBULATORY_CARE_PROVIDER_SITE_OTHER): Admitting: Physician Assistant

## 2025-03-08 ENCOUNTER — Other Ambulatory Visit

## 2025-03-15 ENCOUNTER — Encounter: Admitting: Family Medicine
